# Patient Record
Sex: Female | Born: 1950 | Race: Black or African American | Hispanic: No | Smoking: Never smoker
Health system: Southern US, Community
[De-identification: ages and names within clinical notes are randomized; demographics above are authoritative.]

## PROBLEM LIST (undated history)

## (undated) DIAGNOSIS — E538 Deficiency of other specified B group vitamins: Secondary | ICD-10-CM

## (undated) DIAGNOSIS — Z9109 Other allergy status, other than to drugs and biological substances: Secondary | ICD-10-CM

## (undated) DIAGNOSIS — M1990 Unspecified osteoarthritis, unspecified site: Secondary | ICD-10-CM

## (undated) DIAGNOSIS — D649 Anemia, unspecified: Secondary | ICD-10-CM

## (undated) HISTORY — DX: Anemia, unspecified: D64.9

## (undated) HISTORY — DX: Deficiency of other specified B group vitamins: E53.8

## (undated) HISTORY — PX: TONSILLECTOMY AND ADENOIDECTOMY: SUR1326

---

## 1999-08-08 ENCOUNTER — Other Ambulatory Visit: Admission: RE | Admit: 1999-08-08 | Discharge: 1999-08-08 | Payer: Self-pay | Admitting: Gynecology

## 2001-12-14 ENCOUNTER — Ambulatory Visit (HOSPITAL_COMMUNITY): Admission: RE | Admit: 2001-12-14 | Discharge: 2001-12-14 | Payer: Self-pay | Admitting: Gastroenterology

## 2001-12-14 ENCOUNTER — Encounter (INDEPENDENT_AMBULATORY_CARE_PROVIDER_SITE_OTHER): Payer: Self-pay | Admitting: Specialist

## 2003-03-30 ENCOUNTER — Other Ambulatory Visit: Admission: RE | Admit: 2003-03-30 | Discharge: 2003-03-30 | Payer: Self-pay | Admitting: Gynecology

## 2003-07-18 ENCOUNTER — Encounter: Admission: RE | Admit: 2003-07-18 | Discharge: 2003-07-18 | Payer: Self-pay | Admitting: Oncology

## 2004-02-02 ENCOUNTER — Ambulatory Visit: Payer: Self-pay | Admitting: Oncology

## 2004-05-02 ENCOUNTER — Ambulatory Visit: Payer: Self-pay | Admitting: Oncology

## 2004-07-02 ENCOUNTER — Ambulatory Visit: Payer: Self-pay | Admitting: Oncology

## 2004-09-11 ENCOUNTER — Ambulatory Visit: Payer: Self-pay | Admitting: Oncology

## 2004-09-19 ENCOUNTER — Ambulatory Visit: Payer: Self-pay | Admitting: Internal Medicine

## 2004-09-20 ENCOUNTER — Ambulatory Visit: Payer: Self-pay | Admitting: Internal Medicine

## 2004-11-06 ENCOUNTER — Ambulatory Visit: Payer: Self-pay | Admitting: Oncology

## 2004-11-30 ENCOUNTER — Ambulatory Visit: Payer: Self-pay | Admitting: Internal Medicine

## 2005-01-08 ENCOUNTER — Ambulatory Visit: Payer: Self-pay | Admitting: Oncology

## 2005-03-13 ENCOUNTER — Ambulatory Visit: Payer: Self-pay | Admitting: Oncology

## 2005-05-14 ENCOUNTER — Ambulatory Visit: Payer: Self-pay | Admitting: Oncology

## 2005-05-29 ENCOUNTER — Ambulatory Visit: Payer: Self-pay | Admitting: Internal Medicine

## 2005-06-24 ENCOUNTER — Ambulatory Visit: Payer: Self-pay | Admitting: Gastroenterology

## 2005-07-09 ENCOUNTER — Ambulatory Visit: Payer: Self-pay | Admitting: Oncology

## 2005-09-09 ENCOUNTER — Ambulatory Visit: Payer: Self-pay | Admitting: Oncology

## 2005-09-11 LAB — CBC WITH DIFFERENTIAL/PLATELET
EOS%: 5.4 % (ref 0.0–7.0)
HCT: 38 % (ref 34.8–46.6)
MCV: 81.3 fL (ref 81.0–101.0)
MONO#: 0.6 10*3/uL (ref 0.1–0.9)
NEUT#: 3.8 10*3/uL (ref 1.5–6.5)
RBC: 4.67 10*6/uL (ref 3.70–5.32)
WBC: 5.7 10*3/uL (ref 3.9–10.0)
lymph#: 1 10*3/uL (ref 0.9–3.3)

## 2005-11-04 ENCOUNTER — Ambulatory Visit: Payer: Self-pay | Admitting: Oncology

## 2005-12-30 ENCOUNTER — Ambulatory Visit: Payer: Self-pay | Admitting: Oncology

## 2006-02-24 ENCOUNTER — Ambulatory Visit: Payer: Self-pay | Admitting: Oncology

## 2006-02-26 ENCOUNTER — Other Ambulatory Visit: Admission: RE | Admit: 2006-02-26 | Discharge: 2006-02-26 | Payer: Self-pay | Admitting: Gynecology

## 2006-04-21 ENCOUNTER — Ambulatory Visit: Payer: Self-pay | Admitting: Oncology

## 2006-06-16 ENCOUNTER — Ambulatory Visit: Payer: Self-pay | Admitting: Oncology

## 2006-08-11 ENCOUNTER — Ambulatory Visit: Payer: Self-pay | Admitting: Oncology

## 2006-09-04 LAB — MORPHOLOGY: PLT EST: ADEQUATE

## 2006-09-04 LAB — CBC WITH DIFFERENTIAL/PLATELET
Basophils Absolute: 0 10*3/uL (ref 0.0–0.1)
EOS%: 6 % (ref 0.0–7.0)
HGB: 11.6 g/dL (ref 11.6–15.9)
MCHC: 33.2 g/dL (ref 32.0–36.0)
MONO#: 0.6 10*3/uL (ref 0.1–0.9)
NEUT#: 3.5 10*3/uL (ref 1.5–6.5)
Platelets: 237 10*3/uL (ref 145–400)
RBC: 4.27 10*6/uL (ref 3.70–5.32)
RDW: 13.5 % (ref 11.3–14.5)
lymph#: 0.9 10*3/uL (ref 0.9–3.3)

## 2006-10-09 ENCOUNTER — Ambulatory Visit: Payer: Self-pay | Admitting: Oncology

## 2006-10-16 ENCOUNTER — Encounter: Admission: RE | Admit: 2006-10-16 | Discharge: 2006-10-16 | Payer: Self-pay | Admitting: Oncology

## 2006-11-28 ENCOUNTER — Encounter: Payer: Self-pay | Admitting: *Deleted

## 2006-12-01 ENCOUNTER — Ambulatory Visit: Payer: Self-pay | Admitting: Oncology

## 2007-01-26 ENCOUNTER — Ambulatory Visit: Payer: Self-pay | Admitting: Oncology

## 2007-03-23 ENCOUNTER — Ambulatory Visit: Payer: Self-pay | Admitting: Oncology

## 2007-05-18 ENCOUNTER — Ambulatory Visit: Payer: Self-pay | Admitting: Oncology

## 2007-07-13 ENCOUNTER — Ambulatory Visit: Payer: Self-pay | Admitting: Oncology

## 2007-09-07 ENCOUNTER — Ambulatory Visit: Payer: Self-pay | Admitting: Oncology

## 2007-09-09 LAB — CBC WITH DIFFERENTIAL/PLATELET
BASO%: 0.5 % (ref 0.0–2.0)
Basophils Absolute: 0 10*3/uL (ref 0.0–0.1)
EOS%: 5 % (ref 0.0–7.0)
Eosinophils Absolute: 0.3 10*3/uL (ref 0.0–0.5)
HGB: 12.7 g/dL (ref 11.6–15.9)
LYMPH%: 14.5 % (ref 14.0–48.0)
MCV: 80.7 fL — ABNORMAL LOW (ref 81.0–101.0)
MONO%: 9.2 % (ref 0.0–13.0)
NEUT%: 70.8 % (ref 39.6–76.8)
WBC: 5.6 10*3/uL (ref 3.9–10.0)

## 2007-09-09 LAB — MORPHOLOGY: PLT EST: ADEQUATE

## 2007-11-02 ENCOUNTER — Ambulatory Visit: Payer: Self-pay | Admitting: Oncology

## 2008-01-25 ENCOUNTER — Ambulatory Visit: Payer: Self-pay | Admitting: Oncology

## 2008-03-23 ENCOUNTER — Ambulatory Visit: Payer: Self-pay | Admitting: Oncology

## 2008-05-19 ENCOUNTER — Ambulatory Visit: Payer: Self-pay | Admitting: Oncology

## 2008-06-23 ENCOUNTER — Ambulatory Visit: Payer: Self-pay | Admitting: Oncology

## 2008-09-06 ENCOUNTER — Ambulatory Visit: Payer: Self-pay | Admitting: Oncology

## 2008-09-08 LAB — CBC WITH DIFFERENTIAL/PLATELET
BASO%: 0.4 % (ref 0.0–2.0)
Basophils Absolute: 0 10*3/uL (ref 0.0–0.1)
MCH: 27.6 pg (ref 25.1–34.0)
MCHC: 33.9 g/dL (ref 31.5–36.0)
MCV: 81.3 fL (ref 79.5–101.0)
MONO%: 8.6 % (ref 0.0–14.0)
NEUT#: 4.2 10*3/uL (ref 1.5–6.5)
RBC: 4.48 10*6/uL (ref 3.70–5.45)
RDW: 12.8 % (ref 11.2–14.5)
WBC: 6.1 10*3/uL (ref 3.9–10.3)
lymph#: 0.9 10*3/uL (ref 0.9–3.3)

## 2008-09-08 LAB — MORPHOLOGY: PLT EST: ADEQUATE

## 2008-09-09 ENCOUNTER — Encounter: Admission: RE | Admit: 2008-09-09 | Discharge: 2008-09-09 | Payer: Self-pay | Admitting: Oncology

## 2008-10-04 ENCOUNTER — Ambulatory Visit: Payer: Self-pay | Admitting: Oncology

## 2008-11-01 ENCOUNTER — Ambulatory Visit: Payer: Self-pay | Admitting: Oncology

## 2008-12-27 ENCOUNTER — Ambulatory Visit: Payer: Self-pay | Admitting: Oncology

## 2009-01-26 ENCOUNTER — Ambulatory Visit: Payer: Self-pay | Admitting: Oncology

## 2009-03-21 ENCOUNTER — Ambulatory Visit: Payer: Self-pay | Admitting: Oncology

## 2009-04-20 ENCOUNTER — Ambulatory Visit: Payer: Self-pay | Admitting: Oncology

## 2009-07-11 ENCOUNTER — Ambulatory Visit: Payer: Self-pay | Admitting: Oncology

## 2009-08-10 ENCOUNTER — Ambulatory Visit: Payer: Self-pay | Admitting: Oncology

## 2009-10-05 ENCOUNTER — Ambulatory Visit: Payer: Self-pay | Admitting: Oncology

## 2009-10-05 LAB — CBC WITH DIFFERENTIAL/PLATELET
Basophils Absolute: 0 10*3/uL (ref 0.0–0.1)
Eosinophils Absolute: 0.4 10*3/uL (ref 0.0–0.5)
HGB: 12.3 g/dL (ref 11.6–15.9)
MCH: 27.3 pg (ref 25.1–34.0)
MCHC: 33.4 g/dL (ref 31.5–36.0)
NEUT#: 4.3 10*3/uL (ref 1.5–6.5)
WBC: 6.9 10*3/uL (ref 3.9–10.3)

## 2009-10-05 LAB — MORPHOLOGY: PLT EST: ADEQUATE

## 2009-10-06 ENCOUNTER — Encounter: Admission: RE | Admit: 2009-10-06 | Discharge: 2009-10-06 | Payer: Self-pay | Admitting: Gynecology

## 2009-11-24 ENCOUNTER — Ambulatory Visit: Payer: Self-pay | Admitting: Oncology

## 2009-12-26 ENCOUNTER — Ambulatory Visit: Payer: Self-pay | Admitting: Oncology

## 2010-01-25 ENCOUNTER — Ambulatory Visit: Payer: Self-pay | Admitting: Oncology

## 2010-03-22 ENCOUNTER — Ambulatory Visit: Payer: Self-pay | Admitting: Oncology

## 2010-04-15 ENCOUNTER — Encounter: Payer: Self-pay | Admitting: Oncology

## 2010-06-14 ENCOUNTER — Encounter (HOSPITAL_BASED_OUTPATIENT_CLINIC_OR_DEPARTMENT_OTHER): Payer: BC Managed Care – PPO | Admitting: Oncology

## 2010-06-14 DIAGNOSIS — D510 Vitamin B12 deficiency anemia due to intrinsic factor deficiency: Secondary | ICD-10-CM

## 2010-07-12 ENCOUNTER — Encounter (HOSPITAL_BASED_OUTPATIENT_CLINIC_OR_DEPARTMENT_OTHER): Payer: BC Managed Care – PPO | Admitting: Oncology

## 2010-07-12 DIAGNOSIS — D510 Vitamin B12 deficiency anemia due to intrinsic factor deficiency: Secondary | ICD-10-CM

## 2010-08-09 ENCOUNTER — Encounter (HOSPITAL_BASED_OUTPATIENT_CLINIC_OR_DEPARTMENT_OTHER): Payer: BC Managed Care – PPO | Admitting: Oncology

## 2010-08-09 DIAGNOSIS — D510 Vitamin B12 deficiency anemia due to intrinsic factor deficiency: Secondary | ICD-10-CM

## 2010-08-10 NOTE — Op Note (Signed)
   NAMECANDA, Kristen Nielsen                          ACCOUNT NO.:  [id number]   MEDICAL RECORD NO.:  [id number]                   PATIENT TYPE:  AMB   LOCATION:  ENDO                                 FACILITY:  [facility]   PHYSICIAN:  Florencia Reasons, M.D.             DATE OF BIRTH:  06-11-1950   DATE OF PROCEDURE:  12/14/2001  DATE OF DISCHARGE:                                 OPERATIVE REPORT   PROCEDURE PERFORMED:  Colonoscopy with polypectomy and biopsy.   ENDOSCOPIST:  Florencia Reasons, M.D.   INDICATIONS FOR PROCEDURE:  The patient is a 60 year old female for colon  cancer screening.   FINDINGS:  Diminutive, left-sided colonic polyps.   DESCRIPTION OF PROCEDURE:  The nature, purpose and risks of the procedure  had been discussed with the patient, who provided written consent.  Sedation  totaled fentanyl 80 mcg and Versed 8 mg IV without arrhythmias or  desaturation.   The Olympus adjustable tension pediatric video colonoscope was advanced to  the cecum as identified by visualization of the appendiceal orifice and  pullback was then performed.  The quality of the prep was excellent and it  is felt that all areas were well seen.  Some external abdominal compression  and placing the patient in the supine position helped [place] get to the cecum.   In the left colon were approximately three diminutive sessile hyperplastic  appearing polyps or prominent folds which were biopsied.  There was also a  slightly verrucous 4 mm raised fold or lesion at about 20 cm removed by  snare technique and retrieved by suctioning through the scope.   No large polyps, cancer, colitis, vascular malformations or diverticulosis  were noted.  The patient tolerated the procedure well and there were no  apparent complications.  Retroflexion in the rectum as well as reinspection  of the rectosigmoid was unremarkable.    IMPRESSION:  Small, left-sided colon polyps.   PLAN:  Await pathology.                                        Florencia Reasons, M.D.    RVB/MEDQ  D:  12/14/2001  T:  12/15/2001  Job:  [id number]   cc:   Valentino Hue. Magrinat, M.D.  [address] [facility]  [place]  [place] [address]  Fax: [phone]

## 2010-10-03 ENCOUNTER — Encounter (HOSPITAL_BASED_OUTPATIENT_CLINIC_OR_DEPARTMENT_OTHER): Payer: BC Managed Care – PPO | Admitting: Oncology

## 2010-10-03 ENCOUNTER — Other Ambulatory Visit: Payer: Self-pay | Admitting: Oncology

## 2010-10-03 DIAGNOSIS — Z1231 Encounter for screening mammogram for malignant neoplasm of breast: Secondary | ICD-10-CM

## 2010-10-03 DIAGNOSIS — D510 Vitamin B12 deficiency anemia due to intrinsic factor deficiency: Secondary | ICD-10-CM

## 2010-10-03 LAB — CBC WITH DIFFERENTIAL/PLATELET
EOS%: 5.5 % (ref 0.0–7.0)
HCT: 35.2 % (ref 34.8–46.6)
HGB: 11.5 g/dL — ABNORMAL LOW (ref 11.6–15.9)
MCH: 27.1 pg (ref 25.1–34.0)
MCHC: 32.8 g/dL (ref 31.5–36.0)
MCV: 82.8 fL (ref 79.5–101.0)
NEUT#: 3.9 10*3/uL (ref 1.5–6.5)
NEUT%: 66.1 % (ref 38.4–76.8)
Platelets: 200 10*3/uL (ref 145–400)
RBC: 4.25 10*6/uL (ref 3.70–5.45)
RDW: 13.9 % (ref 11.2–14.5)
WBC: 5.9 10*3/uL (ref 3.9–10.3)

## 2010-10-03 LAB — TSH: TSH: 0.748 u[IU]/mL (ref 0.350–4.500)

## 2010-10-18 ENCOUNTER — Ambulatory Visit
Admission: RE | Admit: 2010-10-18 | Discharge: 2010-10-18 | Disposition: A | Discharge status: Home / Self Care | Payer: BC Managed Care – PPO | Source: Ambulatory Visit | Attending: Oncology | Admitting: Oncology

## 2010-10-18 DIAGNOSIS — Z1231 Encounter for screening mammogram for malignant neoplasm of breast: Secondary | ICD-10-CM

## 2010-10-30 ENCOUNTER — Other Ambulatory Visit: Payer: Self-pay | Admitting: Gynecology

## 2010-11-02 ENCOUNTER — Encounter (HOSPITAL_BASED_OUTPATIENT_CLINIC_OR_DEPARTMENT_OTHER): Payer: BC Managed Care – PPO | Admitting: Oncology

## 2010-11-02 DIAGNOSIS — D510 Vitamin B12 deficiency anemia due to intrinsic factor deficiency: Secondary | ICD-10-CM

## 2011-01-02 ENCOUNTER — Encounter (HOSPITAL_BASED_OUTPATIENT_CLINIC_OR_DEPARTMENT_OTHER): Payer: BC Managed Care – PPO | Admitting: Oncology

## 2011-01-02 DIAGNOSIS — D510 Vitamin B12 deficiency anemia due to intrinsic factor deficiency: Secondary | ICD-10-CM

## 2011-01-28 ENCOUNTER — Other Ambulatory Visit: Payer: Self-pay | Admitting: Oncology

## 2011-01-28 DIAGNOSIS — D510 Vitamin B12 deficiency anemia due to intrinsic factor deficiency: Secondary | ICD-10-CM | POA: Insufficient documentation

## 2011-01-28 MED ORDER — CYANOCOBALAMIN 1000 MCG/ML IJ SOLN: 1000.0000 ug | INTRAMUSCULAR | Status: DC

## 2011-01-29 ENCOUNTER — Other Ambulatory Visit: Payer: Self-pay | Admitting: Oncology

## 2011-01-29 ENCOUNTER — Ambulatory Visit (HOSPITAL_BASED_OUTPATIENT_CLINIC_OR_DEPARTMENT_OTHER): Payer: BC Managed Care – PPO

## 2011-01-29 VITALS — BP 119/74 | HR 56 | Temp 97.1°F

## 2011-01-29 DIAGNOSIS — D510 Vitamin B12 deficiency anemia due to intrinsic factor deficiency: Secondary | ICD-10-CM

## 2011-01-29 MED ORDER — CYANOCOBALAMIN 1000 MCG/ML IJ SOLN
1000.0000 ug | Freq: Once | INTRAMUSCULAR | Status: AC
  Administered 2011-01-29: 1000 ug via INTRAMUSCULAR

## 2011-02-21 ENCOUNTER — Ambulatory Visit (HOSPITAL_BASED_OUTPATIENT_CLINIC_OR_DEPARTMENT_OTHER): Payer: BC Managed Care – PPO

## 2011-02-21 VITALS — BP 113/69 | HR 60 | Temp 96.9°F

## 2011-02-21 DIAGNOSIS — D510 Vitamin B12 deficiency anemia due to intrinsic factor deficiency: Secondary | ICD-10-CM

## 2011-02-21 MED ORDER — CYANOCOBALAMIN 1000 MCG/ML IJ SOLN
1000.0000 ug | Freq: Once | INTRAMUSCULAR | Status: AC
  Administered 2011-02-21: 1000 ug via INTRAMUSCULAR

## 2011-03-21 ENCOUNTER — Ambulatory Visit (HOSPITAL_BASED_OUTPATIENT_CLINIC_OR_DEPARTMENT_OTHER): Payer: BC Managed Care – PPO

## 2011-03-21 VITALS — BP 112/69 | HR 68 | Temp 97.2°F

## 2011-03-21 DIAGNOSIS — D510 Vitamin B12 deficiency anemia due to intrinsic factor deficiency: Secondary | ICD-10-CM

## 2011-03-21 MED ORDER — CYANOCOBALAMIN 1000 MCG/ML IJ SOLN
1000.0000 ug | Freq: Once | INTRAMUSCULAR | Status: AC
  Administered 2011-03-21: 1000 ug via INTRAMUSCULAR

## 2011-03-23 ENCOUNTER — Telehealth: Payer: Self-pay | Admitting: Oncology

## 2011-03-23 NOTE — Telephone Encounter (Signed)
Talked to pt gave her appt for 1/24 lab and injection, pt will come and get calendar for appts until July 2013, lab and MD visit with inj

## 2011-04-18 ENCOUNTER — Ambulatory Visit: Payer: BC Managed Care – PPO

## 2011-05-16 ENCOUNTER — Ambulatory Visit (HOSPITAL_BASED_OUTPATIENT_CLINIC_OR_DEPARTMENT_OTHER): Payer: BC Managed Care – PPO

## 2011-05-16 VITALS — BP 107/66 | HR 85 | Temp 97.7°F

## 2011-05-16 DIAGNOSIS — D510 Vitamin B12 deficiency anemia due to intrinsic factor deficiency: Secondary | ICD-10-CM

## 2011-05-16 MED ORDER — CYANOCOBALAMIN 1000 MCG/ML IJ SOLN
1000.0000 ug | Freq: Once | INTRAMUSCULAR | Status: AC
  Administered 2011-05-16: 1000 ug via INTRAMUSCULAR

## 2011-06-13 ENCOUNTER — Ambulatory Visit (HOSPITAL_BASED_OUTPATIENT_CLINIC_OR_DEPARTMENT_OTHER): Payer: BC Managed Care – PPO

## 2011-06-13 VITALS — BP 128/78 | HR 56 | Temp 97.2°F

## 2011-06-13 DIAGNOSIS — D510 Vitamin B12 deficiency anemia due to intrinsic factor deficiency: Secondary | ICD-10-CM

## 2011-06-13 MED ORDER — CYANOCOBALAMIN 1000 MCG/ML IJ SOLN
1000.0000 ug | Freq: Once | INTRAMUSCULAR | Status: AC
  Administered 2011-06-13: 1000 ug via INTRAMUSCULAR

## 2011-06-13 NOTE — Patient Instructions (Signed)
Pt discharged home ambulatory.  Instructed to call with questions/concerns. shk

## 2011-07-11 ENCOUNTER — Ambulatory Visit: Payer: BC Managed Care – PPO

## 2011-08-08 ENCOUNTER — Ambulatory Visit (HOSPITAL_BASED_OUTPATIENT_CLINIC_OR_DEPARTMENT_OTHER): Payer: BC Managed Care – PPO

## 2011-08-08 VITALS — BP 124/73 | HR 59 | Temp 97.1°F

## 2011-08-08 DIAGNOSIS — D510 Vitamin B12 deficiency anemia due to intrinsic factor deficiency: Secondary | ICD-10-CM

## 2011-08-08 MED ORDER — CYANOCOBALAMIN 1000 MCG/ML IJ SOLN
1000.0000 ug | Freq: Once | INTRAMUSCULAR | Status: AC
  Administered 2011-08-08: 1000 ug via INTRAMUSCULAR

## 2011-09-05 ENCOUNTER — Ambulatory Visit (HOSPITAL_BASED_OUTPATIENT_CLINIC_OR_DEPARTMENT_OTHER): Payer: BC Managed Care – PPO

## 2011-09-05 VITALS — BP 114/71 | HR 58 | Temp 96.8°F

## 2011-09-05 DIAGNOSIS — D510 Vitamin B12 deficiency anemia due to intrinsic factor deficiency: Secondary | ICD-10-CM

## 2011-09-05 MED ORDER — CYANOCOBALAMIN 1000 MCG/ML IJ SOLN
1000.0000 ug | Freq: Once | INTRAMUSCULAR | Status: AC
  Administered 2011-09-05: 1000 ug via INTRAMUSCULAR

## 2011-10-02 ENCOUNTER — Ambulatory Visit (HOSPITAL_BASED_OUTPATIENT_CLINIC_OR_DEPARTMENT_OTHER): Payer: BC Managed Care – PPO | Admitting: Oncology

## 2011-10-02 ENCOUNTER — Other Ambulatory Visit (HOSPITAL_BASED_OUTPATIENT_CLINIC_OR_DEPARTMENT_OTHER): Payer: BC Managed Care – PPO | Admitting: Lab

## 2011-10-02 ENCOUNTER — Telehealth: Payer: Self-pay | Admitting: Oncology

## 2011-10-02 ENCOUNTER — Encounter: Payer: Self-pay | Admitting: Oncology

## 2011-10-02 VITALS — BP 124/72 | HR 67 | Temp 98.2°F | Ht 65.5 in | Wt 190.9 lb

## 2011-10-02 DIAGNOSIS — D510 Vitamin B12 deficiency anemia due to intrinsic factor deficiency: Secondary | ICD-10-CM

## 2011-10-02 LAB — CBC WITH DIFFERENTIAL/PLATELET
BASO%: 0.5 % (ref 0.0–2.0)
EOS%: 6.1 % (ref 0.0–7.0)
MCH: 26.5 pg (ref 25.1–34.0)
MCHC: 31.8 g/dL (ref 31.5–36.0)
NEUT%: 61.8 % (ref 38.4–76.8)
RBC: 4.41 10*6/uL (ref 3.70–5.45)
RDW: 13.5 % (ref 11.2–14.5)
lymph#: 1.2 10*3/uL (ref 0.9–3.3)

## 2011-10-02 MED ORDER — CYANOCOBALAMIN 1000 MCG/ML IJ SOLN
1000.0000 ug | Freq: Once | INTRAMUSCULAR | Status: AC
  Administered 2011-10-02: 1000 ug via INTRAMUSCULAR

## 2011-10-02 NOTE — Telephone Encounter (Signed)
gve the pt her aug-July 2014 appt calendars

## 2011-10-02 NOTE — Progress Notes (Signed)
ID: Kristen Nielsen   DOB: 1951-03-13  MR#: [id number]  CSN#:[id number]  HISTORY OF PRESENT ILLNESS: The patient was diagnosed with pernicious anemia in 1996. She receives her B12 shots here since she does not have a primary care physician.  INTERVAL HISTORY: The interval history is unremarkable.  REVIEW OF SYSTEMS: She has mild occasional sinus problems, occasional shortness of breath when walking up stairs, not accompanied by chest pain or pressure. Otherwise she is exercising at least twice a week doing line dances and is of physically active. A detailed review of systems was noncontributory  PAST MEDICAL HISTORY: No past medical history on file.  PAST SURGICAL HISTORY: No past surgical history on file.  FAMILY HISTORY As of July of 2013, her parents are still alive. Her father has a history of prostate cancer. The patient has one sister, who was diagnosed with breast cancer the age of 81. She is now doing well. She has one brother.  GYNECOLOGIC HISTORY: GX P2, first live birth age 32. Change of life age 35. She never took hormone replacement.  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 Her daughter lives in [place], her son here in town. She has 4 grandchildren and attends a local [address].   ADVANCED DIRECTIVES:  HEALTH MAINTENANCE: History  Substance Use Topics  . Smoking status: Not on file  . Smokeless tobacco: Not on file  . Alcohol Use: Not on file     Colonoscopy: Buccini  PAP: Lomax  Bone density: 2007/ normal  Lipid panel: Lomax  No Known Allergies  Current Outpatient Prescriptions  Medication Sig Dispense Refill  . acetaminophen (TYLENOL) 325 MG tablet Take 650 mg by mouth as needed.      Marland Kitchen aspirin 325 MG tablet Take 325 mg by mouth daily.      . cyanocobalamin 1000 MCG tablet Inject 100 mcg into the skin every 30 (thirty) days.         OBJECTIVE: Middle-aged [place] American woman who appears  well Filed Vitals:   10/02/11 1115  BP: 124/72  Pulse: 67  Temp: 98.2 F (36.8 C)     Body mass index is 31.28 kg/(m^2).    ECOG FS: 0  Sclerae unicteric Oropharynx clear No cervical or supraclavicular adenopathy Lungs no rales or rhonchi Heart regular rate and rhythm Abd benign MSK no focal spinal tenderness, no peripheral edema Neuro: nonfocal Breasts: Neither breast shows any suspicious lumps, skin change, or nipple retraction. Both axillae are unremarkable  LAB RESULTS: Lab Results  Component Value Date   WBC 6.2 10/02/2011   NEUTROABS 3.9 10/02/2011   HGB 11.7 10/02/2011   HCT 36.7 10/02/2011   MCV 83.2 10/02/2011   PLT 222 10/02/2011      Chemistry   No results found for this basename: NA, K, CL, CO2, BUN, CREATININE, GLU   No results found for this basename: CALCIUM, ALKPHOS, AST, ALT, BILITOT       No results found for this basename: LABCA2    No components found with this basename: LABCA125    No results found for this basename: INR:1;PROTIME:1 in the last 168 hours  Urinalysis No results found for this basename: colorurine, appearanceur, labspec, phurine, glucoseu, hgbur, bilirubinur, ketonesur, proteinur, urobilinogen, nitrite, leukocytesur    STUDIES: No results found.  ASSESSMENT: 61 y.o. [place] woman with history of pernicious anemia diagnosed in 1996 with a positive intrinsic factor for blocking antibody.  Currently receives B12 injections on  a monthly basis, due again today.   PLAN: She will receive her shot today and every 4 weeks for the next year. She will continue to see [place] on a once a year basis. She knows to call for any problems that may develop before the next visit.   Saim Almanza C    10/02/2011

## 2011-10-30 ENCOUNTER — Ambulatory Visit (HOSPITAL_BASED_OUTPATIENT_CLINIC_OR_DEPARTMENT_OTHER): Payer: BC Managed Care – PPO

## 2011-10-30 VITALS — BP 152/81 | HR 56 | Temp 97.7°F

## 2011-10-30 DIAGNOSIS — D510 Vitamin B12 deficiency anemia due to intrinsic factor deficiency: Secondary | ICD-10-CM

## 2011-10-30 MED ORDER — CYANOCOBALAMIN 1000 MCG/ML IJ SOLN
1000.0000 ug | Freq: Once | INTRAMUSCULAR | Status: AC
  Administered 2011-10-30: 1000 ug via INTRAMUSCULAR

## 2011-10-30 NOTE — Patient Instructions (Signed)
Call MD with any problems 

## 2011-11-27 ENCOUNTER — Ambulatory Visit (HOSPITAL_BASED_OUTPATIENT_CLINIC_OR_DEPARTMENT_OTHER): Payer: BC Managed Care – PPO

## 2011-11-27 VITALS — BP 125/77 | HR 59 | Temp 97.2°F

## 2011-11-27 DIAGNOSIS — D510 Vitamin B12 deficiency anemia due to intrinsic factor deficiency: Secondary | ICD-10-CM

## 2011-11-27 MED ORDER — CYANOCOBALAMIN 1000 MCG/ML IJ SOLN
1000.0000 ug | Freq: Once | INTRAMUSCULAR | Status: AC
  Administered 2011-11-27: 1000 ug via INTRAMUSCULAR

## 2011-12-03 ENCOUNTER — Other Ambulatory Visit: Payer: Self-pay | Admitting: Gynecology

## 2011-12-03 DIAGNOSIS — Z1231 Encounter for screening mammogram for malignant neoplasm of breast: Secondary | ICD-10-CM

## 2011-12-12 ENCOUNTER — Other Ambulatory Visit: Payer: Self-pay | Admitting: Gynecology

## 2011-12-20 ENCOUNTER — Ambulatory Visit
Admission: RE | Admit: 2011-12-20 | Discharge: 2011-12-20 | Disposition: A | Discharge status: Home / Self Care | Payer: BC Managed Care – PPO | Source: Ambulatory Visit | Attending: Gynecology | Admitting: Gynecology

## 2011-12-20 DIAGNOSIS — Z1231 Encounter for screening mammogram for malignant neoplasm of breast: Secondary | ICD-10-CM

## 2011-12-25 ENCOUNTER — Ambulatory Visit (HOSPITAL_BASED_OUTPATIENT_CLINIC_OR_DEPARTMENT_OTHER): Payer: BC Managed Care – PPO

## 2011-12-25 VITALS — BP 107/71 | HR 55 | Temp 97.6°F

## 2011-12-25 DIAGNOSIS — D510 Vitamin B12 deficiency anemia due to intrinsic factor deficiency: Secondary | ICD-10-CM

## 2011-12-25 MED ORDER — CYANOCOBALAMIN 1000 MCG/ML IJ SOLN
1000.0000 ug | Freq: Once | INTRAMUSCULAR | Status: AC
  Administered 2011-12-25: 1000 ug via INTRAMUSCULAR

## 2012-01-29 ENCOUNTER — Telehealth: Payer: Self-pay | Admitting: *Deleted

## 2012-01-29 ENCOUNTER — Ambulatory Visit: Payer: BC Managed Care – PPO

## 2012-01-29 NOTE — Telephone Encounter (Signed)
Called patient at home about missed appointment  Rescheduled appointment to 01/30/12.

## 2012-01-30 ENCOUNTER — Ambulatory Visit (HOSPITAL_BASED_OUTPATIENT_CLINIC_OR_DEPARTMENT_OTHER): Payer: BC Managed Care – PPO

## 2012-01-30 VITALS — BP 107/64 | HR 63 | Temp 98.1°F

## 2012-01-30 DIAGNOSIS — D510 Vitamin B12 deficiency anemia due to intrinsic factor deficiency: Secondary | ICD-10-CM

## 2012-01-30 MED ORDER — CYANOCOBALAMIN 1000 MCG/ML IJ SOLN
1000.0000 ug | Freq: Once | INTRAMUSCULAR | Status: AC
  Administered 2012-01-30: 1000 ug via INTRAMUSCULAR

## 2012-02-26 ENCOUNTER — Ambulatory Visit (HOSPITAL_BASED_OUTPATIENT_CLINIC_OR_DEPARTMENT_OTHER): Payer: BC Managed Care – PPO

## 2012-02-26 VITALS — BP 121/72 | HR 60 | Temp 96.9°F

## 2012-02-26 DIAGNOSIS — D510 Vitamin B12 deficiency anemia due to intrinsic factor deficiency: Secondary | ICD-10-CM

## 2012-02-26 MED ORDER — CYANOCOBALAMIN 1000 MCG/ML IJ SOLN
1000.0000 ug | Freq: Once | INTRAMUSCULAR | Status: AC
  Administered 2012-02-26: 1000 ug via INTRAMUSCULAR

## 2012-03-12 ENCOUNTER — Telehealth: Payer: Self-pay | Admitting: Oncology

## 2012-03-12 NOTE — Telephone Encounter (Signed)
Office closed on 1/1. Moved inj appt to 1/2 @ 8:30 am. lmonvm for pt re change w/new d/t. Also called mobile and was able to s/w pt re change. Pt has new appt d/t.

## 2012-03-25 ENCOUNTER — Ambulatory Visit: Payer: BC Managed Care – PPO

## 2012-03-26 ENCOUNTER — Ambulatory Visit (HOSPITAL_BASED_OUTPATIENT_CLINIC_OR_DEPARTMENT_OTHER): Payer: BC Managed Care – PPO

## 2012-03-26 VITALS — BP 119/74 | HR 59 | Temp 96.5°F

## 2012-03-26 DIAGNOSIS — D510 Vitamin B12 deficiency anemia due to intrinsic factor deficiency: Secondary | ICD-10-CM

## 2012-03-26 MED ORDER — CYANOCOBALAMIN 1000 MCG/ML IJ SOLN
1000.0000 ug | Freq: Once | INTRAMUSCULAR | Status: AC
  Administered 2012-03-26: 1000 ug via INTRAMUSCULAR

## 2012-04-29 ENCOUNTER — Ambulatory Visit (HOSPITAL_BASED_OUTPATIENT_CLINIC_OR_DEPARTMENT_OTHER): Payer: BC Managed Care – PPO

## 2012-04-29 VITALS — BP 128/86 | HR 67 | Temp 97.1°F

## 2012-04-29 DIAGNOSIS — D510 Vitamin B12 deficiency anemia due to intrinsic factor deficiency: Secondary | ICD-10-CM

## 2012-04-29 MED ORDER — CYANOCOBALAMIN 1000 MCG/ML IJ SOLN
1000.0000 ug | Freq: Once | INTRAMUSCULAR | Status: AC
  Administered 2012-04-29: 1000 ug via INTRAMUSCULAR

## 2012-05-27 ENCOUNTER — Ambulatory Visit (HOSPITAL_BASED_OUTPATIENT_CLINIC_OR_DEPARTMENT_OTHER): Payer: BC Managed Care – PPO

## 2012-05-27 VITALS — BP 127/73 | HR 55 | Temp 97.6°F

## 2012-05-27 DIAGNOSIS — D510 Vitamin B12 deficiency anemia due to intrinsic factor deficiency: Secondary | ICD-10-CM

## 2012-05-27 MED ORDER — CYANOCOBALAMIN 1000 MCG/ML IJ SOLN
1000.0000 ug | Freq: Once | INTRAMUSCULAR | Status: AC
  Administered 2012-05-27: 1000 ug via INTRAMUSCULAR

## 2012-06-24 ENCOUNTER — Ambulatory Visit (HOSPITAL_BASED_OUTPATIENT_CLINIC_OR_DEPARTMENT_OTHER): Payer: BC Managed Care – PPO

## 2012-06-24 VITALS — BP 132/64 | HR 64 | Temp 97.4°F

## 2012-06-24 DIAGNOSIS — D510 Vitamin B12 deficiency anemia due to intrinsic factor deficiency: Secondary | ICD-10-CM

## 2012-06-24 MED ORDER — CYANOCOBALAMIN 1000 MCG/ML IJ SOLN
1000.0000 ug | Freq: Once | INTRAMUSCULAR | Status: AC
  Administered 2012-06-24: 1000 ug via INTRAMUSCULAR

## 2012-07-29 ENCOUNTER — Ambulatory Visit (HOSPITAL_BASED_OUTPATIENT_CLINIC_OR_DEPARTMENT_OTHER): Payer: BC Managed Care – PPO

## 2012-07-29 VITALS — BP 127/69 | HR 57 | Temp 97.7°F

## 2012-07-29 DIAGNOSIS — D510 Vitamin B12 deficiency anemia due to intrinsic factor deficiency: Secondary | ICD-10-CM

## 2012-07-29 MED ORDER — CYANOCOBALAMIN 1000 MCG/ML IJ SOLN
1000.0000 ug | Freq: Once | INTRAMUSCULAR | Status: AC
  Administered 2012-07-29: 1000 ug via INTRAMUSCULAR

## 2012-08-26 ENCOUNTER — Ambulatory Visit (HOSPITAL_BASED_OUTPATIENT_CLINIC_OR_DEPARTMENT_OTHER): Payer: BC Managed Care – PPO

## 2012-08-26 VITALS — BP 129/82 | HR 50 | Temp 96.7°F | Resp 18

## 2012-08-26 DIAGNOSIS — D510 Vitamin B12 deficiency anemia due to intrinsic factor deficiency: Secondary | ICD-10-CM

## 2012-08-26 MED ORDER — CYANOCOBALAMIN 1000 MCG/ML IJ SOLN
1000.0000 ug | Freq: Once | INTRAMUSCULAR | Status: AC
  Administered 2012-08-26: 1000 ug via INTRAMUSCULAR

## 2012-08-26 NOTE — Patient Instructions (Signed)
Call MD for problems or concerns 

## 2012-09-23 ENCOUNTER — Ambulatory Visit: Payer: BC Managed Care – PPO

## 2012-09-29 ENCOUNTER — Other Ambulatory Visit (HOSPITAL_BASED_OUTPATIENT_CLINIC_OR_DEPARTMENT_OTHER): Payer: BC Managed Care – PPO | Admitting: Lab

## 2012-09-29 ENCOUNTER — Telehealth: Payer: Self-pay | Admitting: *Deleted

## 2012-09-29 ENCOUNTER — Ambulatory Visit (HOSPITAL_BASED_OUTPATIENT_CLINIC_OR_DEPARTMENT_OTHER): Payer: BC Managed Care – PPO | Admitting: Oncology

## 2012-09-29 VITALS — BP 120/68 | HR 55 | Temp 97.6°F | Resp 20 | Ht 65.5 in | Wt 184.4 lb

## 2012-09-29 DIAGNOSIS — D510 Vitamin B12 deficiency anemia due to intrinsic factor deficiency: Secondary | ICD-10-CM

## 2012-09-29 LAB — CBC WITH DIFFERENTIAL/PLATELET
BASO%: 0.8 % (ref 0.0–2.0)
EOS%: 6.8 % (ref 0.0–7.0)
Eosinophils Absolute: 0.4 10*3/uL (ref 0.0–0.5)
LYMPH%: 19.6 % (ref 14.0–49.7)
MCHC: 33.1 g/dL (ref 31.5–36.0)
MCV: 82 fL (ref 79.5–101.0)
MONO%: 10 % (ref 0.0–14.0)
NEUT#: 3.7 10*3/uL (ref 1.5–6.5)
RBC: 4.48 10*6/uL (ref 3.70–5.45)
RDW: 13.3 % (ref 11.2–14.5)

## 2012-09-29 MED ORDER — CYANOCOBALAMIN 1000 MCG/ML IJ SOLN
1000.0000 ug | Freq: Once | INTRAMUSCULAR | Status: AC
  Administered 2012-09-29: 1000 ug via INTRAMUSCULAR

## 2012-09-29 NOTE — Telephone Encounter (Signed)
appts made and printed...td 

## 2012-09-29 NOTE — Progress Notes (Signed)
ID: Kristen Nielsen   DOB: 1951/03/07  MR#: [id number]  CSN#:[id number]  PCP: Sonda Primes, MD GYN: Beather Arbour SU:  OTHER MD:   HISTORY OF PRESENT ILLNESS: The patient was diagnosed with pernicious anemia in 1996. She receives her B12 shots here since she does not have a primary care physician.  INTERVAL HISTORY: Kristen Nielsen is doing fine, taking care of her parents who are now in their early 101s and declining. She's also helping out her siblings, she tells me. She exercises chiefly by doing line dancing.  REVIEW OF SYSTEMS: She just got new glasses, and they seem to be working well. She has chronic sinus issues, which she treats with Zyrtec. She has pain involving particularly the right hand, which is not constant not increasing in intensity or frequency. Otherwise a detailed review of systems today was noncontributory  PAST MEDICAL HISTORY: No past medical history on file.  PAST SURGICAL HISTORY: Past Surgical History  Procedure Laterality Date  . Tonsillectomy and adenoidectomy      FAMILY HISTORY As of July of 2014, her parents are still alive. Her father has a history of prostate cancer. Both parents have been in the hospital recently and are now receiving home physical therapy. The patient has one sister, who was diagnosed with breast cancer the age of 50. She is now doing well. She has one brother.  GYNECOLOGIC HISTORY: GX P2, first live birth age 62. Change of life age 62. She never took hormone replacement.  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 Her daughter lives in [place], her son here in town. She has 4 grandchildren and attends a local [address].   ADVANCED DIRECTIVES: Not in place  HEALTH MAINTENANCE: History  Substance Use Topics  . Smoking status: Not on file  . Smokeless tobacco: Not on file  . Alcohol Use: Not on file     Colonoscopy: Buccini  PAP: Lomax  Bone density: 2007/ normal  Lipid  panel: Lomax  No Known Allergies  Current Outpatient Prescriptions  Medication Sig Dispense Refill  . acetaminophen (TYLENOL) 325 MG tablet Take 650 mg by mouth as needed.      Marland Kitchen aspirin 325 MG tablet Take 325 mg by mouth daily.      . cyanocobalamin 1000 MCG tablet Inject 100 mcg into the skin every 30 (thirty) days.        No current facility-administered medications for this visit.    OBJECTIVE: Middle-aged [place] American woman in no acute distress Filed Vitals:   09/29/12 0958  BP: 120/68  Pulse: 55  Temp: 97.6 F (36.4 C)  Resp: 20     Body mass index is 30.21 kg/(m^2).    ECOG FS: 0  Sclerae unicteric Oropharynx clear No cervical or supraclavicular adenopathy Lungs no rales or rhonchi Heart regular rate and rhythm Abd soft, nontender, positive bowel sounds MSK no focal spinal tenderness, no peripheral edema Neuro: nonfocal, well oriented, pleasant affect Breasts: Both breasts are benign. Both axillae are unremarkable  LAB RESULTS: Lab Results  Component Value Date   WBC 6.0 09/29/2012   NEUTROABS 3.7 09/29/2012   HGB 12.2 09/29/2012   HCT 36.8 09/29/2012   MCV 82.0 09/29/2012   PLT 226 09/29/2012      Chemistry   No results found for this basename: NA,  K,  CL,  CO2,  BUN,  CREATININE,  GLU   No results found for this basename: CALCIUM,  ALKPHOS,  AST,  ALT,  BILITOT       No results found for this basename: LABCA2    No components found with this basename: LABCA125    No results found for this basename: INR,  in the last 168 hours  Urinalysis No results found for this basename: colorurine,  appearanceur,  labspec,  phurine,  glucoseu,  hgbur,  bilirubinur,  ketonesur,  proteinur,  urobilinogen,  nitrite,  leukocytesur    STUDIES: No results found.   ASSESSMENT: 62 y.o. [place] woman with history of pernicious anemia diagnosed in 1996 with a positive intrinsic factor for blocking antibody.  Receives B12 injections on a monthly basis, due again today.    PLAN: Kristen Nielsen is generally doing very well. She has lost her gynecologist and is not sure who she is going to followup with. She has not seen her primary care physician in some time, and I encouraged her to reestablish yourself with him. Otherwise we are continuing as before. She will see [place] again in one year. She knows to call for any problems that may develop before her next visit here.  MAGRINAT,GUSTAV C    09/29/2012

## 2012-09-29 NOTE — Addendum Note (Signed)
Addended by: Billey Co on: 09/29/2012 06:45 PM   Modules accepted: Orders

## 2012-10-06 ENCOUNTER — Ambulatory Visit: Payer: BC Managed Care – PPO | Admitting: Oncology

## 2012-10-27 ENCOUNTER — Other Ambulatory Visit: Payer: Self-pay | Admitting: *Deleted

## 2012-10-28 ENCOUNTER — Other Ambulatory Visit: Payer: BC Managed Care – PPO | Admitting: Lab

## 2012-10-28 ENCOUNTER — Ambulatory Visit (HOSPITAL_BASED_OUTPATIENT_CLINIC_OR_DEPARTMENT_OTHER): Payer: BC Managed Care – PPO

## 2012-10-28 VITALS — BP 135/69 | HR 54 | Temp 97.7°F

## 2012-10-28 DIAGNOSIS — D510 Vitamin B12 deficiency anemia due to intrinsic factor deficiency: Secondary | ICD-10-CM

## 2012-10-28 MED ORDER — CYANOCOBALAMIN 1000 MCG/ML IJ SOLN
1000.0000 ug | Freq: Once | INTRAMUSCULAR | Status: AC
  Administered 2012-10-28: 1000 ug via INTRAMUSCULAR

## 2012-11-25 ENCOUNTER — Ambulatory Visit (HOSPITAL_BASED_OUTPATIENT_CLINIC_OR_DEPARTMENT_OTHER): Payer: BC Managed Care – PPO

## 2012-11-25 VITALS — BP 120/71 | HR 58 | Temp 97.5°F

## 2012-11-25 DIAGNOSIS — D510 Vitamin B12 deficiency anemia due to intrinsic factor deficiency: Secondary | ICD-10-CM

## 2012-11-25 MED ORDER — CYANOCOBALAMIN 1000 MCG/ML IJ SOLN
1000.0000 ug | Freq: Once | INTRAMUSCULAR | Status: AC
  Administered 2012-11-25: 1000 ug via INTRAMUSCULAR

## 2012-12-22 ENCOUNTER — Ambulatory Visit: Payer: BC Managed Care – PPO

## 2012-12-23 ENCOUNTER — Telehealth: Payer: Self-pay | Admitting: *Deleted

## 2012-12-23 ENCOUNTER — Ambulatory Visit: Payer: BC Managed Care – PPO

## 2012-12-23 NOTE — Telephone Encounter (Signed)
Called patient about missed appointment.  She is out of town and forgot about this appointment.  Will skip this one and come is for next scheduled appointment.

## 2013-01-20 ENCOUNTER — Encounter (INDEPENDENT_AMBULATORY_CARE_PROVIDER_SITE_OTHER): Payer: Self-pay

## 2013-01-20 ENCOUNTER — Ambulatory Visit (HOSPITAL_BASED_OUTPATIENT_CLINIC_OR_DEPARTMENT_OTHER): Payer: BC Managed Care – PPO

## 2013-01-20 VITALS — BP 138/71 | HR 67 | Temp 97.5°F

## 2013-01-20 DIAGNOSIS — D510 Vitamin B12 deficiency anemia due to intrinsic factor deficiency: Secondary | ICD-10-CM

## 2013-01-20 MED ORDER — CYANOCOBALAMIN 1000 MCG/ML IJ SOLN
1000.0000 ug | Freq: Once | INTRAMUSCULAR | Status: AC
  Administered 2013-01-20: 1000 ug via INTRAMUSCULAR

## 2013-02-17 ENCOUNTER — Ambulatory Visit (HOSPITAL_BASED_OUTPATIENT_CLINIC_OR_DEPARTMENT_OTHER): Payer: BC Managed Care – PPO

## 2013-02-17 ENCOUNTER — Encounter (INDEPENDENT_AMBULATORY_CARE_PROVIDER_SITE_OTHER): Payer: Self-pay

## 2013-02-17 VITALS — BP 123/61 | HR 66 | Temp 97.6°F

## 2013-02-17 DIAGNOSIS — D510 Vitamin B12 deficiency anemia due to intrinsic factor deficiency: Secondary | ICD-10-CM

## 2013-02-17 MED ORDER — CYANOCOBALAMIN 1000 MCG/ML IJ SOLN
1000.0000 ug | Freq: Once | INTRAMUSCULAR | Status: AC
  Administered 2013-02-17: 1000 ug via INTRAMUSCULAR

## 2013-03-17 ENCOUNTER — Ambulatory Visit (HOSPITAL_BASED_OUTPATIENT_CLINIC_OR_DEPARTMENT_OTHER): Payer: BC Managed Care – PPO

## 2013-03-17 VITALS — BP 114/65 | HR 64 | Temp 98.3°F

## 2013-03-17 DIAGNOSIS — D510 Vitamin B12 deficiency anemia due to intrinsic factor deficiency: Secondary | ICD-10-CM

## 2013-03-17 MED ORDER — CYANOCOBALAMIN 1000 MCG/ML IJ SOLN
1000.0000 ug | Freq: Once | INTRAMUSCULAR | Status: AC
  Administered 2013-03-17: 1000 ug via INTRAMUSCULAR

## 2013-04-14 ENCOUNTER — Encounter (INDEPENDENT_AMBULATORY_CARE_PROVIDER_SITE_OTHER): Payer: Self-pay

## 2013-04-14 ENCOUNTER — Telehealth: Payer: Self-pay | Admitting: Internal Medicine

## 2013-04-14 ENCOUNTER — Ambulatory Visit (HOSPITAL_BASED_OUTPATIENT_CLINIC_OR_DEPARTMENT_OTHER): Payer: BC Managed Care – PPO

## 2013-04-14 VITALS — BP 134/56 | HR 67 | Temp 98.1°F

## 2013-04-14 DIAGNOSIS — D510 Vitamin B12 deficiency anemia due to intrinsic factor deficiency: Secondary | ICD-10-CM

## 2013-04-14 MED ORDER — CYANOCOBALAMIN 1000 MCG/ML IJ SOLN
1000.0000 ug | Freq: Once | INTRAMUSCULAR | Status: AC
  Administered 2013-04-14: 1000 ug via INTRAMUSCULAR

## 2013-04-14 NOTE — Patient Instructions (Signed)
Vitamin B12 Injections Every person needs vitamin B12. A deficiency develops when the body does not get enough of it. One way to overcome this is by getting B12 shots (injections). A B12 shot puts the vitamin directly into muscle tissue. This avoids any problems your body might have in absorbing it from food or a pill. In some people, the body has trouble using the vitamin correctly. This can cause a B12 deficiency. Not consuming enough of the vitamin can also cause a deficiency. Getting enough vitamin B12 can be hard for elderly people. Sometimes, they do not eat a well-balanced diet. The elderly are also more likely than younger people to have medical conditions or take medications that can lead to a deficiency. WHAT DOES VITAMIN B12 DO? Vitamin B12 does many things to help the body work right:  It helps the body make healthy red blood cells.  It helps maintain nerve cells.  It is involved in the body's process of converting food into energy (metabolism).  It is needed to make the genetic material in all cells (DNA). VITAMIN B12 FOOD SOURCES Most people get plenty of vitamin B12 through the foods they eat. It is present in:  Meat, fish, poultry, and eggs.  Milk and milk products.  It also is added when certain foods are made, including some breads, cereals and yogurts. The food is then called "fortified". CAUSES The most common causes of vitamin B12 deficiency are:  Pernicious anemia. The condition develops when the body cannot make enough healthy red blood cells. This stems from a lack of a protein made in the stomach (intrinsic factor). People without this protein cannot absorb enough vitamin B12 from food.  Malabsorption. This is when the body cannot absorb the vitamin. It can be caused by:  Pernicious anemia.  Surgery to remove part or all of the stomach can lead to malabsorption. Removal of part or all of the small intestine can also cause malabsorption.  Vegetarian diet.  People who are strict about not eating foods from animals could have trouble taking in enough vitamin B12 from diet alone.  Medications. Some medicines have been linked to B12 deficiency, such as Metformin (a drug prescribed for type 2 diabetes). Long-term use of stomach acid suppressants also can keep the vitamin from being absorbed.  Intestinal problems such as inflammatory bowel disease. If there are problems in the digestive tract, vitamin B12 may not be absorbed in good enough amounts. SYMPTOMS People who do not get enough B12 can develop problems. These can include:  Anemia. This is when the body has too few red blood cells. Red blood cells carry oxygen to the rest of the body. Without a healthy supply of red blood cells, people can feel:  Tired (fatigued).  Weak.  Severe anemia can cause:  Shortness of breath.  Dizziness.  Rapid heart rate.  Paleness.  Other Vitamin B12 deficiency symptoms include:  Diarrhea.  Numbness or tingling in the hands or feet.  Loss of appetite.  Confusion.  Sores on the tongue or in the mouth. LET YOUR CAREGIVER KNOW ABOUT:  Any allergies. It is very important to know if you are allergic or sensitive to cobalt. Vitamin B12 contains cobalt.  Any history of kidney disease.  All medications you are taking. Include prescription and over-the-counter medicines, herbs and creams.  Whether you are pregnant or breast-feeding.  If you have Leber's disease, a hereditary eye condition, vitamin B12 could make it worse. RISKS AND COMPLICATIONS Reactions to an injection are   usually temporary. They might include:  Pain at the injection site.  Redness, swelling or tenderness at the site.  Headache, dizziness or weakness.  Nausea, upset stomach or diarrhea.  Numbness or tingling.  Fever.  Joint pain.  Itching or rash. If a reaction does not go away in a short while, talk with your healthcare provider. A change in the way the shots are  given, or where they are given, might need to be made. BEFORE AN INJECTION To decide whether B12 injections are right for you, your healthcare provider will probably:  Ask about your medical history.  Ask questions about your diet.  Ask about symptoms such as:  Have you felt weak?  Do you feel unusually tired?  Do you get dizzy?  Order blood tests. These may include a test to:  Check the level of red cells in your blood.  Measure B12 levels.  Check for the presence of intrinsic factor. VITAMIN B12 INJECTIONS How often you will need a vitamin B12 injection will depend on how severe your deficiency is. This also will affect how long you will need to get them. People with pernicious anemia usually get injections for their entire life. Others might get them for a shorter period. For many people, injections are given daily or weekly for several weeks. Then, once B12 levels are normal, injections are given just once a month. If the cause of the deficiency can be fixed, the injections can be stopped. Talk with your healthcare provider about what you should expect. For an injection:  The injection site will be cleaned with an alcohol swab.  Your healthcare provider will insert a needle directly into a muscle. Most any muscle can be used. Most often, an arm muscle is used. A buttocks muscle can also be used. Many people say shots in that area are less painful.  A small adhesive bandage may be put over the injection site. It usually can be taken off in an hour or less. Injections can be given by your healthcare provider. In some cases, family members give them. Sometimes, people give them to themselves. Talk with your healthcare provider about what would be best for you. If someone other than your healthcare provider will be giving the shots, the person will need to be trained to give them correctly. HOME CARE INSTRUCTIONS   You can remove the adhesive bandage within an hour of getting a  shot.  You should be able to go about your normal activities right away.  Avoid drinking large amounts of alcohol while taking vitamin B12 shots. Alcohol can interfere with the body's use of the vitamin. SEEK MEDICAL CARE IF:   Pain, redness, swelling or tenderness at the injection site does not get better or gets worse.  Headache, dizziness or weakness does not go away.  You develop a fever of more than 100.5 F (38.1 C). SEEK IMMEDIATE MEDICAL CARE IF:   You have chest pain.  You develop shortness of breath.  You have muscle weakness that gets worse.  You develop numbness, weakness or tingling on one side or one area of the body.  You have symptoms of an allergic reaction, such as:  Hives.  Difficulty breathing.  Swelling of the lips, face, tongue or throat.  You develop a fever of more than 102.0 F (38.9 C). MAKE SURE YOU:   Understand these instructions.  Will watch your condition.  Will get help right away if you are not doing well or get worse. Document   Released: 06/07/2008 Document Revised: 06/03/2011 Document Reviewed: 06/07/2008 Exit[employer] Patient Information 2014 [employer].  

## 2013-04-14 NOTE — Telephone Encounter (Signed)
Pt request to reestablish, last ov was 2007. Pt stated she need physical done and pt has BCBS for insurance. Please advise.

## 2013-04-15 NOTE — Telephone Encounter (Signed)
Left vm for pt to call back and schedule a physical with Dr. Macario GoldsPlot.

## 2013-04-15 NOTE — Telephone Encounter (Signed)
Ok Thx 

## 2013-04-19 ENCOUNTER — Ambulatory Visit (INDEPENDENT_AMBULATORY_CARE_PROVIDER_SITE_OTHER): Payer: BC Managed Care – PPO | Admitting: Internal Medicine

## 2013-04-19 ENCOUNTER — Encounter: Payer: Self-pay | Admitting: Internal Medicine

## 2013-04-19 VITALS — BP 142/86 | HR 76 | Temp 97.8°F | Resp 16 | Ht 66.5 in | Wt 188.0 lb

## 2013-04-19 DIAGNOSIS — D510 Vitamin B12 deficiency anemia due to intrinsic factor deficiency: Secondary | ICD-10-CM

## 2013-04-19 DIAGNOSIS — Z0000 Encounter for general adult medical examination without abnormal findings: Secondary | ICD-10-CM | POA: Insufficient documentation

## 2013-04-19 DIAGNOSIS — Z23 Encounter for immunization: Secondary | ICD-10-CM

## 2013-04-19 DIAGNOSIS — IMO0001 Reserved for inherently not codable concepts without codable children: Secondary | ICD-10-CM

## 2013-04-19 MED ORDER — VITAMIN D 1000 UNITS PO TABS: 1000.0000 [IU] | ORAL_TABLET | Freq: Every day | ORAL | Status: AC

## 2013-04-19 MED ORDER — CYANOCOBALAMIN 1000 MCG/ML IJ SOLN: 1000.0000 ug | INTRAMUSCULAR | Status: DC

## 2013-04-19 NOTE — Progress Notes (Signed)
   Subjective:    HPI  The patient is here for a wellness exam - NEW PT. The patient has been doing well overall without major physical or psychological issues going on lately. The patient needs to address  chronic B12 def, controlled with medical treatment, congestion.  BP Readings from Last 3 Encounters:  04/19/13 142/86  04/14/13 134/56  03/17/13 114/65   Wt Readings from Last 3 Encounters:  04/19/13 188 lb (85.276 kg)  09/29/12 184 lb 6.4 oz (83.643 kg)  10/02/11 190 lb 14.4 oz (86.592 kg)      Review of Systems  Constitutional: Negative for fever, chills, diaphoresis, activity change, appetite change, fatigue and unexpected weight change.  HENT: Positive for rhinorrhea and sinus pressure. Negative for congestion, dental problem, ear pain, hearing loss, mouth sores, postnasal drip, sneezing, sore throat and voice change.   Eyes: Negative for pain and visual disturbance.  Respiratory: Negative for cough, chest tightness, wheezing and stridor.   Cardiovascular: Negative for chest pain, palpitations and leg swelling.  Gastrointestinal: Negative for nausea, vomiting, abdominal pain, blood in stool, abdominal distention and rectal pain.  Genitourinary: Negative for dysuria, hematuria, decreased urine volume, vaginal bleeding, vaginal discharge, difficulty urinating, vaginal pain and menstrual problem.  Musculoskeletal: Negative for back pain, gait problem, joint swelling and neck pain.  Skin: Negative for color change, rash and wound.  Neurological: Negative for dizziness, tremors, syncope, speech difficulty and light-headedness.  Hematological: Negative for adenopathy.  Psychiatric/Behavioral: Negative for suicidal ideas, hallucinations, behavioral problems, confusion, sleep disturbance, dysphoric mood and decreased concentration. The patient is not hyperactive.        Objective:   Physical Exam  Constitutional: She appears well-developed. No distress.  HENT:  Head:  Normocephalic.  Right Ear: External ear normal.  Left Ear: External ear normal.  Nose: Nose normal.  Mouth/Throat: Oropharynx is clear and moist.  Eyes: Conjunctivae are normal. Pupils are equal, round, and reactive to light. Right eye exhibits no discharge. Left eye exhibits no discharge.  Neck: Normal range of motion. Neck supple. No JVD present. No tracheal deviation present. No thyromegaly present.  Cardiovascular: Normal rate, regular rhythm and normal heart sounds.   Pulmonary/Chest: No stridor. No respiratory distress. She has no wheezes.  Abdominal: Soft. Bowel sounds are normal. She exhibits no distension and no mass. There is no tenderness. There is no rebound and no guarding.  Musculoskeletal: She exhibits no edema and no tenderness.  Lymphadenopathy:    She has no cervical adenopathy.  Neurological: She displays normal reflexes. No cranial nerve deficit. She exhibits normal muscle tone. Coordination normal.  Skin: No rash noted. No erythema.  Psychiatric: She has a normal mood and affect. Her behavior is normal. Judgment and thought content normal.    Lab Results  Component Value Date   WBC 6.0 09/29/2012   HGB 12.2 09/29/2012   HCT 36.8 09/29/2012   PLT 226 09/29/2012   TSH 0.748 10/03/2010         Assessment & Plan:

## 2013-04-19 NOTE — Assessment & Plan Note (Signed)
Continue with current prescription therapy as reflected on the Med list.  

## 2013-04-19 NOTE — Assessment & Plan Note (Signed)
We discussed age appropriate health related issues, including available/recomended screening tests and vaccinations. We discussed a need for adhering to healthy diet and exercise. Labs/EKG were reviewed/ordered. All questions were answered. Colon  Dr Matthias HughsBuccini - due ?2017 Flu tdap

## 2013-04-19 NOTE — Progress Notes (Signed)
Pre visit review using our clinic review tool, if applicable. No additional management support is needed unless otherwise documented below in the visit note. 

## 2013-04-21 ENCOUNTER — Other Ambulatory Visit (INDEPENDENT_AMBULATORY_CARE_PROVIDER_SITE_OTHER): Payer: BC Managed Care – PPO

## 2013-04-21 DIAGNOSIS — Z0000 Encounter for general adult medical examination without abnormal findings: Secondary | ICD-10-CM

## 2013-04-21 LAB — CBC WITH DIFFERENTIAL/PLATELET
BASOS PCT: 0.3 % (ref 0.0–3.0)
Basophils Absolute: 0 10*3/uL (ref 0.0–0.1)
EOS ABS: 0.3 10*3/uL (ref 0.0–0.7)
Eosinophils Relative: 5.2 % — ABNORMAL HIGH (ref 0.0–5.0)
HCT: 40 % (ref 36.0–46.0)
Hemoglobin: 12.5 g/dL (ref 12.0–15.0)
Lymphocytes Relative: 15.8 % (ref 12.0–46.0)
Lymphs Abs: 1 10*3/uL (ref 0.7–4.0)
MCHC: 31.3 g/dL (ref 30.0–36.0)
MCV: 84.1 fl (ref 78.0–100.0)
MONO ABS: 0.6 10*3/uL (ref 0.1–1.0)
Monocytes Relative: 10 % (ref 3.0–12.0)
NEUTROS PCT: 68.7 % (ref 43.0–77.0)
Neutro Abs: 4.2 10*3/uL (ref 1.4–7.7)
PLATELETS: 242 10*3/uL (ref 150.0–400.0)
RBC: 4.75 Mil/uL (ref 3.87–5.11)
RDW: 13.2 % (ref 11.5–14.6)
WBC: 6.2 10*3/uL (ref 4.5–10.5)

## 2013-04-21 LAB — URINALYSIS
Bilirubin Urine: NEGATIVE
Hgb urine dipstick: NEGATIVE
KETONES UR: NEGATIVE
Leukocytes, UA: NEGATIVE
Nitrite: NEGATIVE
PH: 6 (ref 5.0–8.0)
Specific Gravity, Urine: 1.025 (ref 1.000–1.030)
Total Protein, Urine: NEGATIVE
Urine Glucose: NEGATIVE
Urobilinogen, UA: 0.2 (ref 0.0–1.0)

## 2013-04-21 LAB — BASIC METABOLIC PANEL
BUN: 15 mg/dL (ref 6–23)
CHLORIDE: 107 meq/L (ref 96–112)
CO2: 28 meq/L (ref 19–32)
CREATININE: 0.8 mg/dL (ref 0.4–1.2)
Calcium: 9.7 mg/dL (ref 8.4–10.5)
GFR: 93.47 mL/min (ref >60.00)
Glucose, Bld: 95 mg/dL (ref 70–99)
Potassium: 4.3 mEq/L (ref 3.5–5.1)
Sodium: 140 mEq/L (ref 135–145)

## 2013-04-21 LAB — LIPID PANEL
CHOL/HDL RATIO: 3
CHOLESTEROL: 197 mg/dL (ref 0–200)
HDL: 62.5 mg/dL (ref >39.00)
LDL Cholesterol: 119 mg/dL — ABNORMAL HIGH (ref 0–99)
TRIGLYCERIDES: 78 mg/dL (ref 0.0–149.0)
VLDL: 15.6 mg/dL (ref 0.0–40.0)

## 2013-04-21 LAB — HEPATIC FUNCTION PANEL
ALT: 27 U/L (ref 0–35)
AST: 23 U/L (ref 0–37)
Albumin: 4.1 g/dL (ref 3.5–5.2)
Alkaline Phosphatase: 103 U/L (ref 39–117)
BILIRUBIN TOTAL: 0.6 mg/dL (ref 0.3–1.2)
Bilirubin, Direct: 0 mg/dL (ref 0.0–0.3)
TOTAL PROTEIN: 7.5 g/dL (ref 6.0–8.3)

## 2013-04-21 LAB — TSH: TSH: 1.4 u[IU]/mL (ref 0.35–5.50)

## 2013-05-11 ENCOUNTER — Ambulatory Visit: Payer: BC Managed Care – PPO

## 2013-05-12 ENCOUNTER — Ambulatory Visit: Payer: BC Managed Care – PPO

## 2013-05-21 ENCOUNTER — Ambulatory Visit
Admission: RE | Admit: 2013-05-21 | Discharge: 2013-05-21 | Disposition: A | Discharge status: Home / Self Care | Payer: BC Managed Care – PPO | Source: Ambulatory Visit | Attending: Oncology | Admitting: Oncology

## 2013-05-21 DIAGNOSIS — D510 Vitamin B12 deficiency anemia due to intrinsic factor deficiency: Secondary | ICD-10-CM

## 2013-06-09 ENCOUNTER — Ambulatory Visit (HOSPITAL_BASED_OUTPATIENT_CLINIC_OR_DEPARTMENT_OTHER): Payer: BC Managed Care – PPO

## 2013-06-09 VITALS — BP 104/67 | HR 55 | Temp 97.4°F

## 2013-06-09 DIAGNOSIS — D510 Vitamin B12 deficiency anemia due to intrinsic factor deficiency: Secondary | ICD-10-CM

## 2013-06-09 MED ORDER — CYANOCOBALAMIN 1000 MCG/ML IJ SOLN
1000.0000 ug | Freq: Once | INTRAMUSCULAR | Status: AC
  Administered 2013-06-09: 1000 ug via INTRAMUSCULAR

## 2013-07-07 ENCOUNTER — Ambulatory Visit (HOSPITAL_BASED_OUTPATIENT_CLINIC_OR_DEPARTMENT_OTHER): Payer: BC Managed Care – PPO

## 2013-07-07 VITALS — BP 120/53 | HR 65 | Temp 97.8°F

## 2013-07-07 DIAGNOSIS — D510 Vitamin B12 deficiency anemia due to intrinsic factor deficiency: Secondary | ICD-10-CM

## 2013-07-07 MED ORDER — CYANOCOBALAMIN 1000 MCG/ML IJ SOLN
1000.0000 ug | Freq: Once | INTRAMUSCULAR | Status: AC
  Administered 2013-07-07: 1000 ug via INTRAMUSCULAR

## 2013-08-04 ENCOUNTER — Ambulatory Visit (HOSPITAL_BASED_OUTPATIENT_CLINIC_OR_DEPARTMENT_OTHER): Payer: BC Managed Care – PPO

## 2013-08-04 VITALS — BP 119/59 | HR 70 | Temp 97.6°F

## 2013-08-04 DIAGNOSIS — D510 Vitamin B12 deficiency anemia due to intrinsic factor deficiency: Secondary | ICD-10-CM

## 2013-08-04 MED ORDER — CYANOCOBALAMIN 1000 MCG/ML IJ SOLN
1000.0000 ug | Freq: Once | INTRAMUSCULAR | Status: AC
  Administered 2013-08-04: 1000 ug via INTRAMUSCULAR

## 2013-08-31 ENCOUNTER — Other Ambulatory Visit: Payer: Self-pay | Admitting: *Deleted

## 2013-08-31 DIAGNOSIS — D510 Vitamin B12 deficiency anemia due to intrinsic factor deficiency: Secondary | ICD-10-CM

## 2013-09-01 ENCOUNTER — Ambulatory Visit: Payer: BC Managed Care – PPO

## 2013-09-01 ENCOUNTER — Other Ambulatory Visit: Payer: BC Managed Care – PPO

## 2013-09-22 ENCOUNTER — Telehealth: Payer: Self-pay | Admitting: *Deleted

## 2013-09-22 ENCOUNTER — Other Ambulatory Visit: Payer: Self-pay | Admitting: *Deleted

## 2013-09-22 DIAGNOSIS — D510 Vitamin B12 deficiency anemia due to intrinsic factor deficiency: Secondary | ICD-10-CM

## 2013-09-22 NOTE — Telephone Encounter (Signed)
Spoke with patient and informed her of Kristen Nielsen's departure.  Confirmed new appointment for 09/29/13 at 1000 for labs and 1030 with Dr. Gerrit HallsSehbai with an injection to follow.

## 2013-09-28 ENCOUNTER — Telehealth: Payer: Self-pay | Admitting: Oncology

## 2013-09-28 NOTE — Telephone Encounter (Signed)
DUE TO NO COVERAGE 7/8  APPT MOVED FROM CP2 TO CB - S/W PT SHE IS AWARE.  °

## 2013-09-29 ENCOUNTER — Telehealth: Payer: Self-pay | Admitting: Oncology

## 2013-09-29 ENCOUNTER — Encounter: Payer: Self-pay | Admitting: Nurse Practitioner

## 2013-09-29 ENCOUNTER — Ambulatory Visit (HOSPITAL_BASED_OUTPATIENT_CLINIC_OR_DEPARTMENT_OTHER): Payer: BC Managed Care – PPO

## 2013-09-29 ENCOUNTER — Ambulatory Visit (HOSPITAL_BASED_OUTPATIENT_CLINIC_OR_DEPARTMENT_OTHER): Payer: BC Managed Care – PPO | Admitting: Nurse Practitioner

## 2013-09-29 ENCOUNTER — Other Ambulatory Visit (HOSPITAL_BASED_OUTPATIENT_CLINIC_OR_DEPARTMENT_OTHER): Payer: BC Managed Care – PPO

## 2013-09-29 VITALS — BP 118/64 | HR 57 | Temp 97.7°F | Resp 18 | Ht 66.0 in | Wt 181.3 lb

## 2013-09-29 DIAGNOSIS — D510 Vitamin B12 deficiency anemia due to intrinsic factor deficiency: Secondary | ICD-10-CM

## 2013-09-29 DIAGNOSIS — J302 Other seasonal allergic rhinitis: Secondary | ICD-10-CM | POA: Insufficient documentation

## 2013-09-29 LAB — CBC WITH DIFFERENTIAL/PLATELET
BASO%: 0.6 % (ref 0.0–2.0)
BASOS ABS: 0 10*3/uL (ref 0.0–0.1)
EOS%: 5.4 % (ref 0.0–7.0)
Eosinophils Absolute: 0.3 10*3/uL (ref 0.0–0.5)
HEMATOCRIT: 39 % (ref 34.8–46.6)
HEMOGLOBIN: 12.2 g/dL (ref 11.6–15.9)
LYMPH#: 1.1 10*3/uL (ref 0.9–3.3)
LYMPH%: 19.6 % (ref 14.0–49.7)
MCH: 26.1 pg (ref 25.1–34.0)
MCHC: 31.4 g/dL — AB (ref 31.5–36.0)
MCV: 83.2 fL (ref 79.5–101.0)
MONO#: 0.6 10*3/uL (ref 0.1–0.9)
MONO%: 11.1 % (ref 0.0–14.0)
NEUT#: 3.6 10*3/uL (ref 1.5–6.5)
NEUT%: 63.3 % (ref 38.4–76.8)
PLATELETS: 231 10*3/uL (ref 145–400)
RBC: 4.69 10*6/uL (ref 3.70–5.45)
RDW: 13.5 % (ref 11.2–14.5)
WBC: 5.7 10*3/uL (ref 3.9–10.3)

## 2013-09-29 LAB — COMPREHENSIVE METABOLIC PANEL (CC13)
ALT: 19 U/L (ref 0–55)
ANION GAP: 5 meq/L (ref 3–11)
AST: 19 U/L (ref 5–34)
Albumin: 3.8 g/dL (ref 3.5–5.0)
Alkaline Phosphatase: 113 U/L (ref 40–150)
BUN: 16.6 mg/dL (ref 7.0–26.0)
CALCIUM: 9.6 mg/dL (ref 8.4–10.4)
CO2: 26 meq/L (ref 22–29)
CREATININE: 0.8 mg/dL (ref 0.6–1.1)
Chloride: 109 mEq/L (ref 98–109)
Glucose: 101 mg/dl (ref 70–140)
Potassium: 4.5 mEq/L (ref 3.5–5.1)
Sodium: 141 mEq/L (ref 136–145)
Total Bilirubin: 0.43 mg/dL (ref 0.20–1.20)
Total Protein: 7 g/dL (ref 6.4–8.3)

## 2013-09-29 MED ORDER — CYANOCOBALAMIN 1000 MCG/ML IJ SOLN
1000.0000 ug | Freq: Once | INTRAMUSCULAR | Status: AC
  Administered 2013-09-29: 1000 ug via INTRAMUSCULAR

## 2013-09-29 NOTE — Assessment & Plan Note (Addendum)
Patient to receive vitamin B 12 injection 1,000 mcg today; and will plan to return in 1 month for next injection. Return in 6 months for labs which will include CBC/diff and CMET.  Return in 1 year for next folllow up exam.

## 2013-09-29 NOTE — Telephone Encounter (Signed)
gv adn rpnted appt sched and avs for pt for Aug thru July 2016

## 2013-09-29 NOTE — Assessment & Plan Note (Signed)
Hx of chronic seasonal allergies; and associated nasal congestion. Has tried Zyrtec and Flonase nasal spray in past; with only minimal effect. Has also seen ENT and had sinus CT in past. Was previously receiving allergy injections; but discontinued these several years ago. Patient stated she has no interest in further tx of her chronic allergies at this time.

## 2013-09-29 NOTE — Progress Notes (Signed)
Chief Complaint  Patient presents with  . Pernicious Anemia    HISTORY: Kristen Nielsen 63 y.o. female presents today for follow up of her pernicious anemia; and to receive her monthly B12 injection. Pt continues to c/o chronic seasonal allergies; but is happy to report that her previous right hand pain has resolved.  She has also now established care with a new PCP Dr. Posey ReaPlotnikov as well. Still plans on establishing with a new GYN since her GYN retired. No other new issues or concerns.   History reviewed. No pertinent past medical history.  has Pernicious anemia; Well adult; and Seasonal allergies on her problem list.     has No Known Allergies.    Medication List       This list is accurate as of: 09/29/13  2:05 PM.  Always use your most recent med list.               acetaminophen 325 MG tablet  Commonly known as:  TYLENOL  Take 650 mg by mouth as needed.     cholecalciferol 1000 UNITS tablet  Commonly known as:  VITAMIN D  Take 1 tablet (1,000 Units total) by mouth daily.     conjugated estrogens vaginal cream  Commonly known as:  PREMARIN  Place 1 Applicatorful vaginally daily.     cyanocobalamin 1000 MCG/ML injection  Commonly known as:  COBAL-1000  Inject 1 mL (1,000 mcg total) into the muscle every 30 (thirty) days.         Past Surgical History  Procedure Laterality Date  . Tonsillectomy and adenoidectomy      Patient denies any headaches, dizziness, double vision, fevers, chills, night sweats, nausea, vomiting, diarrhea, constipation, chest pain, heart palpitations, shortness of breath, blood in stool, black tarry stool, urinary pain, urinary burning, urinary frequency, hematuria.    PHYSICAL EXAMINATION  Filed Vitals:   09/29/13 1013  BP: 118/64  Pulse: 57  Temp: 97.7 F (36.5 C)  Resp: 18    GENERAL:alert, healthy, no distress, well nourished and well developed SKIN: skin color, texture, turgor are normal, no rashes or significant  lesions HEAD: Normocephalic EYES: PERRLA, Conjunctiva are pink and non-injected OROPHARYNX:lips, buccal mucosa, and tongue normal  NECK: no adenopathy, no bruits, no JVD LYMPH:  no palpable lymphadenopathy BREAST:not examined LUNGS: negative findings:  chest symmetric with normal A/P diameter, normal respiratory rate and rhythm, lungs clear to auscultation HEART: regular rate & rhythm, no murmurs and no gallops ABDOMEN:abdomen soft, non-tender, normal bowel sounds and no masses or organomegaly BACK: No CVA tenderness, Range of motion is normal EXTREMITIES:no edema, no clubbing, no cyanosis  NEURO: alert & oriented x 3 with fluent speech, gait normal  LABORATORY DATA: CBC    Component Value Date/Time   WBC 5.7 09/29/2013 0956   WBC 6.2 04/21/2013 0846   RBC 4.69 09/29/2013 0956   RBC 4.75 04/21/2013 0846   HGB 12.2 09/29/2013 0956   HGB 12.5 04/21/2013 0846   HCT 39.0 09/29/2013 0956   HCT 40.0 04/21/2013 0846   PLT 231 09/29/2013 0956   PLT 242.0 04/21/2013 0846   MCV 83.2 09/29/2013 0956   MCV 84.1 04/21/2013 0846   MCH 26.1 09/29/2013 0956   MCHC 31.4* 09/29/2013 0956   MCHC 31.3 04/21/2013 0846   RDW 13.5 09/29/2013 0956   RDW 13.2 04/21/2013 0846   LYMPHSABS 1.1 09/29/2013 0956   LYMPHSABS 1.0 04/21/2013 0846   MONOABS 0.6 09/29/2013 0956   MONOABS 0.6 04/21/2013 0846  EOSABS 0.3 09/29/2013 0956   EOSABS 0.3 04/21/2013 0846   BASOSABS 0.0 09/29/2013 0956   BASOSABS 0.0 04/21/2013 0846        Component Value Date/Time   NA 141 09/29/2013 0956   NA 140 04/21/2013 0846   K 4.5 09/29/2013 0956   K 4.3 04/21/2013 0846   CL 107 04/21/2013 0846   CO2 26 09/29/2013 0956   CO2 28 04/21/2013 0846   GLUCOSE 101 09/29/2013 0956   GLUCOSE 95 04/21/2013 0846   BUN 16.6 09/29/2013 0956   BUN 15 04/21/2013 0846   CREATININE 0.8 09/29/2013 0956   CREATININE 0.8 04/21/2013 0846   CALCIUM 9.6 09/29/2013 0956   CALCIUM 9.7 04/21/2013 0846   PROT 7.0 09/29/2013 0956   PROT 7.5 04/21/2013 0846   ALBUMIN 3.8 09/29/2013 0956   ALBUMIN  4.1 04/21/2013 0846   AST 19 09/29/2013 0956   AST 23 04/21/2013 0846   ALT 19 09/29/2013 0956   ALT 27 04/21/2013 0846   ALKPHOS 113 09/29/2013 0956   ALKPHOS 103 04/21/2013 0846   BILITOT 0.43 09/29/2013 0956   BILITOT 0.6 04/21/2013 0846   ASSESSMENT/PLAN:      Pernicious anemia  Assessment & Plan Patient to receive vitamin B 12 injection 1,000 mcg today; and will plan to return in 1 month for next injection. Return in 6 months for labs which will include CBC/diff and CMET.  Return in 1 year for next folllow up exam.    Seasonal allergies  Assessment & Plan Hx of chronic seasonal allergies; and associated nasal congestion. Has tried Zyrtec and Flonase nasal spray in past; with only minimal effect. Has also seen ENT and had sinus CT in past. Was previously receiving allergy injections; but discontinued these several years ago. Patient stated she has no interest in further tx of her chronic allergies at this time.      Patient stated understanding of all instructions; and was in agreement with this plan of care. The patient knows to call the clinic with any problems, questions or concerns.   Total time spent with patient was 30 minutes;  with greater than 75 percent of time spent in face to face counseling regarding discussion of seasonal allergy symptoms and treatment options; as well as follow up and coordination.     Payton MccallumBacon, Jaqueline Uber, NP 09/29/2013

## 2013-10-25 ENCOUNTER — Other Ambulatory Visit: Payer: Self-pay

## 2013-10-27 ENCOUNTER — Ambulatory Visit: Payer: BC Managed Care – PPO

## 2013-11-24 ENCOUNTER — Ambulatory Visit (HOSPITAL_BASED_OUTPATIENT_CLINIC_OR_DEPARTMENT_OTHER): Payer: BC Managed Care – PPO

## 2013-11-24 VITALS — BP 109/64 | HR 52 | Temp 97.7°F

## 2013-11-24 DIAGNOSIS — D510 Vitamin B12 deficiency anemia due to intrinsic factor deficiency: Secondary | ICD-10-CM

## 2013-11-24 DIAGNOSIS — Z23 Encounter for immunization: Secondary | ICD-10-CM

## 2013-11-24 MED ORDER — CYANOCOBALAMIN 1000 MCG/ML IJ SOLN
1000.0000 ug | Freq: Once | INTRAMUSCULAR | Status: AC
  Administered 2013-11-24: 1000 ug via INTRAMUSCULAR

## 2013-11-24 MED ORDER — INFLUENZA VAC SPLIT QUAD 0.5 ML IM SUSY
0.5000 mL | PREFILLED_SYRINGE | Freq: Once | INTRAMUSCULAR | Status: AC
  Administered 2013-11-24: 0.5 mL via INTRAMUSCULAR
  Filled 2013-11-24: qty 0.5

## 2013-12-22 ENCOUNTER — Ambulatory Visit (HOSPITAL_BASED_OUTPATIENT_CLINIC_OR_DEPARTMENT_OTHER): Payer: BC Managed Care – PPO

## 2013-12-22 VITALS — BP 138/84 | HR 61 | Temp 97.8°F

## 2013-12-22 DIAGNOSIS — D510 Vitamin B12 deficiency anemia due to intrinsic factor deficiency: Secondary | ICD-10-CM

## 2013-12-22 MED ORDER — CYANOCOBALAMIN 1000 MCG/ML IJ SOLN
1000.0000 ug | Freq: Once | INTRAMUSCULAR | Status: AC
  Administered 2013-12-22: 1000 ug via INTRAMUSCULAR

## 2013-12-22 NOTE — Patient Instructions (Signed)
Vitamin B12 Injections Every person needs vitamin B12. A deficiency develops when the body does not get enough of it. One way to overcome this is by getting B12 shots (injections). A B12 shot puts the vitamin directly into muscle tissue. This avoids any problems your body might have in absorbing it from food or a pill. In some people, the body has trouble using the vitamin correctly. This can cause a B12 deficiency. Not consuming enough of the vitamin can also cause a deficiency. Getting enough vitamin B12 can be hard for elderly people. Sometimes, they do not eat a well-balanced diet. The elderly are also more likely than younger people to have medical conditions or take medications that can lead to a deficiency. WHAT DOES VITAMIN B12 DO? Vitamin B12 does many things to help the body work right:  It helps the body make healthy red blood cells.  It helps maintain nerve cells.  It is involved in the body's process of converting food into energy (metabolism).  It is needed to make the genetic material in all cells (DNA). VITAMIN B12 FOOD SOURCES Most people get plenty of vitamin B12 through the foods they eat. It is present in:  Meat, fish, poultry, and eggs.  Milk and milk products.  It also is added when certain foods are made, including some breads, cereals and yogurts. The food is then called "fortified". CAUSES The most common causes of vitamin B12 deficiency are:  Pernicious anemia. The condition develops when the body cannot make enough healthy red blood cells. This stems from a lack of a protein made in the stomach (intrinsic factor). People without this protein cannot absorb enough vitamin B12 from food.  Malabsorption. This is when the body cannot absorb the vitamin. It can be caused by:  Pernicious anemia.  Surgery to remove part or all of the stomach can lead to malabsorption. Removal of part or all of the small intestine can also cause malabsorption.  Vegetarian diet.  People who are strict about not eating foods from animals could have trouble taking in enough vitamin B12 from diet alone.  Medications. Some medicines have been linked to B12 deficiency, such as Metformin (a drug prescribed for type 2 diabetes). Long-term use of stomach acid suppressants also can keep the vitamin from being absorbed.  Intestinal problems such as inflammatory bowel disease. If there are problems in the digestive tract, vitamin B12 may not be absorbed in good enough amounts. SYMPTOMS People who do not get enough B12 can develop problems. These can include:  Anemia. This is when the body has too few red blood cells. Red blood cells carry oxygen to the rest of the body. Without a healthy supply of red blood cells, people can feel:  Tired (fatigued).  Weak.  Severe anemia can cause:  Shortness of breath.  Dizziness.  Rapid heart rate.  Paleness.  Other Vitamin B12 deficiency symptoms include:  Diarrhea.  Numbness or tingling in the hands or feet.  Loss of appetite.  Confusion.  Sores on the tongue or in the mouth. LET YOUR CAREGIVER KNOW ABOUT:  Any allergies. It is very important to know if you are allergic or sensitive to cobalt. Vitamin B12 contains cobalt.  Any history of kidney disease.  All medications you are taking. Include prescription and over-the-counter medicines, herbs and creams.  Whether you are pregnant or breast-feeding.  If you have Leber's disease, a hereditary eye condition, vitamin B12 could make it worse. RISKS AND COMPLICATIONS Reactions to an injection are   usually temporary. They might include:  Pain at the injection site.  Redness, swelling or tenderness at the site.  Headache, dizziness or weakness.  Nausea, upset stomach or diarrhea.  Numbness or tingling.  Fever.  Joint pain.  Itching or rash. If a reaction does not go away in a short while, talk with your healthcare provider. A change in the way the shots are  given, or where they are given, might need to be made. BEFORE AN INJECTION To decide whether B12 injections are right for you, your healthcare provider will probably:  Ask about your medical history.  Ask questions about your diet.  Ask about symptoms such as:  Have you felt weak?  Do you feel unusually tired?  Do you get dizzy?  Order blood tests. These may include a test to:  Check the level of red cells in your blood.  Measure B12 levels.  Check for the presence of intrinsic factor. VITAMIN B12 INJECTIONS How often you will need a vitamin B12 injection will depend on how severe your deficiency is. This also will affect how long you will need to get them. People with pernicious anemia usually get injections for their entire life. Others might get them for a shorter period. For many people, injections are given daily or weekly for several weeks. Then, once B12 levels are normal, injections are given just once a month. If the cause of the deficiency can be fixed, the injections can be stopped. Talk with your healthcare provider about what you should expect. For an injection:  The injection site will be cleaned with an alcohol swab.  Your healthcare provider will insert a needle directly into a muscle. Most any muscle can be used. Most often, an arm muscle is used. A buttocks muscle can also be used. Many people say shots in that area are less painful.  A small adhesive bandage may be put over the injection site. It usually can be taken off in an hour or less. Injections can be given by your healthcare provider. In some cases, family members give them. Sometimes, people give them to themselves. Talk with your healthcare provider about what would be best for you. If someone other than your healthcare provider will be giving the shots, the person will need to be trained to give them correctly. HOME CARE INSTRUCTIONS   You can remove the adhesive bandage within an hour of getting a  shot.  You should be able to go about your normal activities right away.  Avoid drinking large amounts of alcohol while taking vitamin B12 shots. Alcohol can interfere with the body's use of the vitamin. SEEK MEDICAL CARE IF:   Pain, redness, swelling or tenderness at the injection site does not get better or gets worse.  Headache, dizziness or weakness does not go away.  You develop a fever of more than 100.5 F (38.1 C). SEEK IMMEDIATE MEDICAL CARE IF:   You have chest pain.  You develop shortness of breath.  You have muscle weakness that gets worse.  You develop numbness, weakness or tingling on one side or one area of the body.  You have symptoms of an allergic reaction, such as:  Hives.  Difficulty breathing.  Swelling of the lips, face, tongue or throat.  You develop a fever of more than 102.0 F (38.9 C). MAKE SURE YOU:   Understand these instructions.  Will watch your condition.  Will get help right away if you are not doing well or get worse. Document   Released: 06/07/2008 Document Revised: 06/03/2011 Document Reviewed: 06/07/2008 ExitCare Patient Information 2015 [employer]. This information is not intended to replace advice given to you by your health care provider. Make sure you discuss any questions you have with your health care provider.  

## 2014-01-18 ENCOUNTER — Telehealth: Payer: Self-pay | Admitting: Oncology

## 2014-01-19 ENCOUNTER — Ambulatory Visit: Payer: BC Managed Care – PPO

## 2014-01-27 ENCOUNTER — Ambulatory Visit (HOSPITAL_BASED_OUTPATIENT_CLINIC_OR_DEPARTMENT_OTHER): Payer: BC Managed Care – PPO

## 2014-01-27 DIAGNOSIS — D510 Vitamin B12 deficiency anemia due to intrinsic factor deficiency: Secondary | ICD-10-CM

## 2014-01-27 MED ORDER — CYANOCOBALAMIN 1000 MCG/ML IJ SOLN
INTRAMUSCULAR | Status: AC
  Filled 2014-01-27: qty 1

## 2014-01-27 MED ORDER — CYANOCOBALAMIN 1000 MCG/ML IJ SOLN
1000.0000 ug | Freq: Once | INTRAMUSCULAR | Status: AC
  Administered 2014-01-27: 1000 ug via INTRAMUSCULAR

## 2014-01-27 NOTE — Patient Instructions (Signed)

## 2014-02-16 ENCOUNTER — Ambulatory Visit (HOSPITAL_BASED_OUTPATIENT_CLINIC_OR_DEPARTMENT_OTHER): Payer: BC Managed Care – PPO

## 2014-02-16 DIAGNOSIS — D510 Vitamin B12 deficiency anemia due to intrinsic factor deficiency: Secondary | ICD-10-CM

## 2014-02-16 MED ORDER — CYANOCOBALAMIN 1000 MCG/ML IJ SOLN
1000.0000 ug | Freq: Once | INTRAMUSCULAR | Status: AC
  Administered 2014-02-16: 1000 ug via INTRAMUSCULAR

## 2014-03-10 ENCOUNTER — Other Ambulatory Visit: Payer: Self-pay | Admitting: Nurse Practitioner

## 2014-03-16 ENCOUNTER — Other Ambulatory Visit: Payer: Self-pay | Admitting: *Deleted

## 2014-03-16 ENCOUNTER — Ambulatory Visit (HOSPITAL_BASED_OUTPATIENT_CLINIC_OR_DEPARTMENT_OTHER): Payer: BC Managed Care – PPO

## 2014-03-16 DIAGNOSIS — D510 Vitamin B12 deficiency anemia due to intrinsic factor deficiency: Secondary | ICD-10-CM

## 2014-03-16 MED ORDER — CYANOCOBALAMIN 1000 MCG/ML IJ SOLN
1000.0000 ug | Freq: Once | INTRAMUSCULAR | Status: AC
  Administered 2014-03-16: 1000 ug via INTRAMUSCULAR

## 2014-03-16 NOTE — Patient Instructions (Signed)

## 2014-04-13 ENCOUNTER — Ambulatory Visit (HOSPITAL_BASED_OUTPATIENT_CLINIC_OR_DEPARTMENT_OTHER): Payer: BC Managed Care – PPO

## 2014-04-13 VITALS — BP 176/79 | HR 66 | Temp 98.2°F

## 2014-04-13 DIAGNOSIS — D510 Vitamin B12 deficiency anemia due to intrinsic factor deficiency: Secondary | ICD-10-CM

## 2014-04-13 MED ORDER — CYANOCOBALAMIN 1000 MCG/ML IJ SOLN
1000.0000 ug | Freq: Once | INTRAMUSCULAR | Status: AC
  Administered 2014-04-13: 1000 ug via INTRAMUSCULAR

## 2014-05-11 ENCOUNTER — Other Ambulatory Visit: Payer: Self-pay | Admitting: *Deleted

## 2014-05-11 ENCOUNTER — Ambulatory Visit (HOSPITAL_BASED_OUTPATIENT_CLINIC_OR_DEPARTMENT_OTHER): Payer: BC Managed Care – PPO

## 2014-05-11 DIAGNOSIS — D510 Vitamin B12 deficiency anemia due to intrinsic factor deficiency: Secondary | ICD-10-CM

## 2014-05-11 MED ORDER — CYANOCOBALAMIN 1000 MCG/ML IJ SOLN
1000.0000 ug | Freq: Once | INTRAMUSCULAR | Status: DC
  Administered 2014-05-11: 1000 ug via INTRAMUSCULAR

## 2014-06-08 ENCOUNTER — Ambulatory Visit: Payer: BC Managed Care – PPO

## 2014-06-08 ENCOUNTER — Other Ambulatory Visit: Payer: Self-pay | Admitting: Oncology

## 2014-07-06 ENCOUNTER — Ambulatory Visit (HOSPITAL_BASED_OUTPATIENT_CLINIC_OR_DEPARTMENT_OTHER): Payer: BC Managed Care – PPO

## 2014-07-06 VITALS — BP 116/74 | HR 57 | Temp 97.6°F

## 2014-07-06 DIAGNOSIS — D510 Vitamin B12 deficiency anemia due to intrinsic factor deficiency: Secondary | ICD-10-CM

## 2014-07-06 MED ORDER — CYANOCOBALAMIN 1000 MCG/ML IJ SOLN
1000.0000 ug | Freq: Once | INTRAMUSCULAR | Status: AC
  Administered 2014-07-06: 1000 ug via INTRAMUSCULAR

## 2014-07-20 ENCOUNTER — Other Ambulatory Visit: Payer: Self-pay

## 2014-07-20 DIAGNOSIS — Z1231 Encounter for screening mammogram for malignant neoplasm of breast: Secondary | ICD-10-CM

## 2014-08-03 ENCOUNTER — Ambulatory Visit (HOSPITAL_BASED_OUTPATIENT_CLINIC_OR_DEPARTMENT_OTHER): Payer: BC Managed Care – PPO

## 2014-08-03 VITALS — BP 127/77 | HR 55 | Temp 98.2°F

## 2014-08-03 DIAGNOSIS — D510 Vitamin B12 deficiency anemia due to intrinsic factor deficiency: Secondary | ICD-10-CM

## 2014-08-03 MED ORDER — CYANOCOBALAMIN 1000 MCG/ML IJ SOLN
1000.0000 ug | Freq: Once | INTRAMUSCULAR | Status: AC
  Administered 2014-08-03: 1000 ug via INTRAMUSCULAR

## 2014-08-10 ENCOUNTER — Ambulatory Visit
Admission: RE | Admit: 2014-08-10 | Discharge: 2014-08-10 | Disposition: A | Discharge status: Home / Self Care | Payer: BC Managed Care – PPO | Source: Ambulatory Visit

## 2014-08-10 DIAGNOSIS — Z1231 Encounter for screening mammogram for malignant neoplasm of breast: Secondary | ICD-10-CM

## 2014-08-31 ENCOUNTER — Ambulatory Visit (HOSPITAL_BASED_OUTPATIENT_CLINIC_OR_DEPARTMENT_OTHER): Payer: BC Managed Care – PPO

## 2014-08-31 VITALS — BP 143/72 | HR 60 | Temp 98.2°F

## 2014-08-31 DIAGNOSIS — D510 Vitamin B12 deficiency anemia due to intrinsic factor deficiency: Secondary | ICD-10-CM | POA: Diagnosis not present

## 2014-08-31 MED ORDER — CYANOCOBALAMIN 1000 MCG/ML IJ SOLN
1000.0000 ug | Freq: Once | INTRAMUSCULAR | Status: AC
  Administered 2014-08-31: 1000 ug via INTRAMUSCULAR

## 2014-09-27 ENCOUNTER — Telehealth: Payer: Self-pay | Admitting: Oncology

## 2014-09-27 ENCOUNTER — Encounter: Payer: Self-pay | Admitting: Certified Nurse Midwife

## 2014-09-27 ENCOUNTER — Encounter: Payer: Self-pay | Admitting: *Deleted

## 2014-09-27 ENCOUNTER — Ambulatory Visit (INDEPENDENT_AMBULATORY_CARE_PROVIDER_SITE_OTHER): Payer: BC Managed Care – PPO | Admitting: Certified Nurse Midwife

## 2014-09-27 VITALS — BP 112/62 | HR 68 | Resp 16 | Ht 65.25 in | Wt 190.0 lb

## 2014-09-27 DIAGNOSIS — Z01419 Encounter for gynecological examination (general) (routine) without abnormal findings: Secondary | ICD-10-CM | POA: Diagnosis not present

## 2014-09-27 DIAGNOSIS — Z0000 Encounter for general adult medical examination without abnormal findings: Secondary | ICD-10-CM | POA: Diagnosis not present

## 2014-09-27 DIAGNOSIS — N841 Polyp of cervix uteri: Secondary | ICD-10-CM

## 2014-09-27 DIAGNOSIS — Z124 Encounter for screening for malignant neoplasm of cervix: Secondary | ICD-10-CM

## 2014-09-27 LAB — POCT URINALYSIS DIPSTICK
Bilirubin, UA: NEGATIVE
Blood, UA: NEGATIVE
Glucose, UA: NEGATIVE
Ketones, UA: NEGATIVE
Leukocytes, UA: NEGATIVE
Nitrite, UA: NEGATIVE
Protein, UA: NEGATIVE
Urobilinogen, UA: NEGATIVE
pH, UA: 5

## 2014-09-27 NOTE — Telephone Encounter (Signed)
Left message confirming appointment rescheule from 07/06 to 07/08 per pof.

## 2014-09-27 NOTE — Progress Notes (Signed)
Left msg for pt to return call to r/s appt with Dr. Darnelle CatalanMagrinat d/t he is in clinic. Contact information given.

## 2014-09-27 NOTE — Patient Instructions (Signed)

## 2014-09-27 NOTE — Progress Notes (Signed)
64 y.o. [id number] Divorced  African American Fe here to establish and for annual exam. Sees PCP for labs and medication management if needed.. Dr. Arlice Colt manages B 12 and some labs.. Denies vaginal bleeding or vaginal dryness. Patient usual weight for her at 180-190. Has been working on exercise. Family history of breast cancer sister late 74's and paternal aunt ?60-70. She is unsure if sister had genetic screening with her breast cancer. No health issues today.   Patient's last menstrual period was 03/25/2008.          Sexually active: No.  The current method of family planning is post menopausal status.    Exercising: Yes.    yardwork Smoker:  no  Health Maintenance: Pap:  2011? Neg per patient MMG: 08-10-14 category b density, birads 1:neg Colonoscopy: 2014 normal Dr. Margret Chance ? Repeat BMD:   Yrs ago maybe 6 yrs ago  Normal TDaP:  2015 Labs: Poct urine-neg Self breast exam: done occ    reports that she has never smoked. She does not have any smokeless tobacco history on file. She reports that she does not drink alcohol or use illicit drugs.  Past Medical History  Diagnosis Date  . Anemia   . Vitamin B 12 deficiency     Past Surgical History  Procedure Laterality Date  . Tonsillectomy and adenoidectomy      Current Outpatient Prescriptions  Medication Sig Dispense Refill  . acetaminophen (TYLENOL) 325 MG tablet Take 650 mg by mouth as needed.    Marland Kitchen BAYER ASPIRIN PO Take by mouth daily.    . Cholecalciferol (VITAMIN D PO) Take 5,000 Int'l Units by mouth daily.    . cyanocobalamin (COBAL-1000) 1000 MCG/ML injection Inject 1 mL (1,000 mcg total) into the muscle every 30 (thirty) days. 10 mL 6  . conjugated estrogens (PREMARIN) vaginal cream Place 1 Applicatorful vaginally daily.     No current facility-administered medications for this visit.    Family History  Problem Relation Age of Onset  . Hypertension Mother   . Cancer Father     lung ca  . Hypertension Father   .  Diabetes Father   . Thyroid disease Father   . Breast cancer Sister     ROS:  Pertinent items are noted in HPI.  Otherwise, a comprehensive ROS was negative.  Exam:   BP 112/62 mmHg  Pulse 68  Resp 16  Ht 5' 5.25" (1.657 m)  Wt 190 lb (86.183 kg)  BMI 31.39 kg/m2  LMP 03/25/2008 Height: 5' 5.25" (165.7 cm) Ht Readings from Last 3 Encounters:  09/27/14 5' 5.25" (1.657 m)  09/29/13  (1.676 m)  04/19/13 5' 6.5" (1.689 m)    General appearance: alert, cooperative and appears stated age Head: Normocephalic, without obvious abnormality, atraumatic Neck: no adenopathy, supple, symmetrical, trachea midline and thyroid normal to inspection and palpation Lungs: clear to auscultation bilaterally Breasts: normal appearance, no masses or tenderness, No nipple retraction or dimpling, No nipple discharge or bleeding, No axillary or supraclavicular adenopathy Heart: regular rate and rhythm Abdomen: soft, non-tender; no masses,  no organomegaly Extremities: extremities normal, atraumatic, no cyanosis or edema Skin: Skin color, texture, turgor normal. No rashes or lesions Lymph nodes: Cervical, supraclavicular, and axillary nodes normal. No abnormal inguinal nodes palpated Neurologic: Grossly normal   Pelvic: External genitalia:  no lesions              Urethra:  normal appearing urethra with no masses, tenderness or lesions  Bartholin's and Skene's: normal                 Vagina: normal appearing vagina with normal color and discharge, no lesions              Cervix: normal,non tender,cervical polyp noted approximately 1 cm, no bleeding with pap              Pap taken: Yes.   Bimanual Exam:  Uterus:  normal size, contour, position, consistency, mobility, non-tender              Adnexa: normal adnexa and no mass, fullness, tenderness               Rectovaginal: Confirms               Anus:  normal sphincter tone, no lesions  Chaperone present: Yes  A:  Well Woman with  normal exam  Menopausal no HRT  Cervical polyp  Atrophic vaginitis with Premarin cream use per previous Gyn Dr. Nicholas LoseLomax, desires continuance  Vitamin B 12 deficiency with MD management  Family history of breast cancer, sister  P:   Reviewed health and wellness pertinent to exam  Discussed importance of notifying if vaginal bleeding occurs.  Discussed findings and etiology of cervical polyp as benign findings. To advise if vaginal bleeding which can occur with polyp, but generally not removed unless symptomatic. Questions addressed.  Will await pap smear and renew Premarin cream, patient aware  Continue follow up with PCP as indicated.  Encouraged patient to check on genetic screening with sister and discussed availability for her to have. Declined information at this point. Stressed yearly mammograms.  Pap smear taken with HPVHR   counseled on breast self exam, mammography screening, adequate intake of calcium and vitamin D, diet and exercise, Kegel's exercises  return annually or prn  An After Visit Summary was printed and given to the patient.

## 2014-09-28 ENCOUNTER — Ambulatory Visit: Payer: BC Managed Care – PPO

## 2014-09-28 ENCOUNTER — Other Ambulatory Visit: Payer: Self-pay | Admitting: *Deleted

## 2014-09-28 ENCOUNTER — Other Ambulatory Visit: Payer: BC Managed Care – PPO

## 2014-09-28 ENCOUNTER — Ambulatory Visit: Payer: BC Managed Care – PPO | Admitting: Oncology

## 2014-09-28 ENCOUNTER — Telehealth: Payer: Self-pay | Admitting: Oncology

## 2014-09-28 DIAGNOSIS — D510 Vitamin B12 deficiency anemia due to intrinsic factor deficiency: Secondary | ICD-10-CM

## 2014-09-28 NOTE — Telephone Encounter (Signed)
Pt confirmed labs/ov per 07/06 POF, gave pt  Calendar.... KJ

## 2014-09-28 NOTE — Progress Notes (Signed)
Encounter reviewed by Dr. Brook Amundson C. Silva.  

## 2014-09-29 NOTE — Addendum Note (Signed)
Addended by: Verner CholLEONARD, Haylyn Halberg S on: 09/29/2014 08:30 AM   Modules accepted: Level of Service, SmartSet

## 2014-09-30 ENCOUNTER — Other Ambulatory Visit: Payer: BC Managed Care – PPO

## 2014-09-30 ENCOUNTER — Ambulatory Visit: Payer: BC Managed Care – PPO | Admitting: Oncology

## 2014-09-30 ENCOUNTER — Ambulatory Visit: Payer: BC Managed Care – PPO

## 2014-09-30 LAB — IPS PAP TEST WITH HPV

## 2014-10-03 ENCOUNTER — Encounter: Payer: Self-pay | Admitting: Nurse Practitioner

## 2014-10-03 ENCOUNTER — Other Ambulatory Visit (HOSPITAL_BASED_OUTPATIENT_CLINIC_OR_DEPARTMENT_OTHER): Payer: BC Managed Care – PPO

## 2014-10-03 ENCOUNTER — Ambulatory Visit (HOSPITAL_BASED_OUTPATIENT_CLINIC_OR_DEPARTMENT_OTHER): Payer: BC Managed Care – PPO

## 2014-10-03 ENCOUNTER — Ambulatory Visit (HOSPITAL_BASED_OUTPATIENT_CLINIC_OR_DEPARTMENT_OTHER): Payer: BC Managed Care – PPO | Admitting: Nurse Practitioner

## 2014-10-03 ENCOUNTER — Telehealth: Payer: Self-pay | Admitting: Oncology

## 2014-10-03 VITALS — BP 126/63 | HR 54 | Temp 97.8°F | Resp 18 | Wt 191.0 lb

## 2014-10-03 VITALS — BP 120/57 | HR 55 | Temp 98.0°F

## 2014-10-03 DIAGNOSIS — D510 Vitamin B12 deficiency anemia due to intrinsic factor deficiency: Secondary | ICD-10-CM

## 2014-10-03 LAB — COMPREHENSIVE METABOLIC PANEL (CC13)
ALK PHOS: 123 U/L (ref 40–150)
ALT: 19 U/L (ref 0–55)
AST: 19 U/L (ref 5–34)
Albumin: 4 g/dL (ref 3.5–5.0)
Anion Gap: 6 mEq/L (ref 3–11)
BILIRUBIN TOTAL: 0.34 mg/dL (ref 0.20–1.20)
BUN: 11.6 mg/dL (ref 7.0–26.0)
CALCIUM: 9.9 mg/dL (ref 8.4–10.4)
CO2: 29 meq/L (ref 22–29)
Chloride: 108 mEq/L (ref 98–109)
Creatinine: 0.8 mg/dL (ref 0.6–1.1)
EGFR: 85 mL/min/{1.73_m2} — ABNORMAL LOW (ref >90)
GLUCOSE: 97 mg/dL (ref 70–140)
POTASSIUM: 4.3 meq/L (ref 3.5–5.1)
Sodium: 142 mEq/L (ref 136–145)
Total Protein: 7.1 g/dL (ref 6.4–8.3)

## 2014-10-03 LAB — CBC WITH DIFFERENTIAL/PLATELET
BASO%: 0.5 % (ref 0.0–2.0)
Basophils Absolute: 0 10*3/uL (ref 0.0–0.1)
EOS ABS: 0.4 10*3/uL (ref 0.0–0.5)
EOS%: 5.2 % (ref 0.0–7.0)
HCT: 39.9 % (ref 34.8–46.6)
HEMOGLOBIN: 12.6 g/dL (ref 11.6–15.9)
LYMPH%: 19 % (ref 14.0–49.7)
MCH: 26 pg (ref 25.1–34.0)
MCHC: 31.7 g/dL (ref 31.5–36.0)
MCV: 82.1 fL (ref 79.5–101.0)
MONO#: 0.8 10*3/uL (ref 0.1–0.9)
MONO%: 10.8 % (ref 0.0–14.0)
NEUT%: 64.5 % (ref 38.4–76.8)
NEUTROS ABS: 4.9 10*3/uL (ref 1.5–6.5)
Platelets: 266 10*3/uL (ref 145–400)
RBC: 4.86 10*6/uL (ref 3.70–5.45)
RDW: 13.3 % (ref 11.2–14.5)
WBC: 7.5 10*3/uL (ref 3.9–10.3)
lymph#: 1.4 10*3/uL (ref 0.9–3.3)

## 2014-10-03 MED ORDER — CYANOCOBALAMIN 1000 MCG/ML IJ SOLN
1000.0000 ug | Freq: Once | INTRAMUSCULAR | Status: AC
  Administered 2014-10-03: 1000 ug via INTRAMUSCULAR

## 2014-10-03 NOTE — Telephone Encounter (Signed)
Gave patient avs report and appointments for August 2016 thru July 2017.

## 2014-10-03 NOTE — Progress Notes (Signed)
ID: Kristen Nielsen   DOB: 06/01/1950  MR#: [id number]  CSN#:[id number]  PCP: Sonda Primes, MD GYN: Beather Arbour SU:  OTHER MD:   HISTORY OF PRESENT ILLNESS: The patient was diagnosed with pernicious anemia in 1996. She started to receive her B12 shots here since she did not have a primary care physician. She has established herself in Dr. Loren Racer office, but still gets her injection here anyway.   INTERVAL HISTORY: Kristen Nielsen returns for follow up of her pernicious anemia. She receives monthly B12 injections her at the [facility]. The interval history is generally unremarkable. She continue to do line dancing and yard work for exercise.  REVIEW OF SYSTEMS: Kristen Nielsen denies fevers, chills, nausea, vomiting, or changes in bowel or bladder habits. She has some vaginal dryness. She visited with her GYN last week and had a pap smear. If this returns normal, she will be given a prescription for premarin cream. She denies pain. She has no shortness of breath, chest pain, cough, or palpitations. Her energy level is good. A detailed review of systems is otherwise stable.   PAST MEDICAL HISTORY: Past Medical History  Diagnosis Date  . Anemia   . Vitamin B 12 deficiency     PAST SURGICAL HISTORY: Past Surgical History  Procedure Laterality Date  . Tonsillectomy and adenoidectomy      FAMILY HISTORY As of July of 2014, her parents are still alive. Her father has a history of prostate cancer. Both parents have been in the hospital recently and are now receiving home physical therapy. The patient has one sister, who was diagnosed with breast cancer the age of 38. She is now doing well. She has one brother.  GYNECOLOGIC HISTORY: GX P2, first live birth age 66. Change of life age 74. She never took hormone replacement.  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 Her daughter lives in [place], her son here in town. She has 4 grandchildren and  attends a local [address].   ADVANCED DIRECTIVES: Not in place  HEALTH MAINTENANCE: History  Substance Use Topics  . Smoking status: Never Smoker   . Smokeless tobacco: Never Used  . Alcohol Use: No     Colonoscopy: Buccini  PAP: Lomax  Bone density: 2007/ normal  Lipid panel: Lomax  No Known Allergies  Current Outpatient Prescriptions  Medication Sig Dispense Refill  . BAYER ASPIRIN PO Take by mouth daily.    . Cholecalciferol (VITAMIN D PO) Take 5,000 Int'l Units by mouth daily.    . cyanocobalamin (COBAL-1000) 1000 MCG/ML injection Inject 1 mL (1,000 mcg total) into the muscle every 30 (thirty) days. 10 mL 6  . acetaminophen (TYLENOL) 325 MG tablet Take 650 mg by mouth as needed.    . conjugated estrogens (PREMARIN) vaginal cream Place 1 Applicatorful vaginally daily.     No current facility-administered medications for this visit.    OBJECTIVE: Middle-aged [place] American woman in no acute distress Filed Vitals:   10/03/14 1110  BP: 126/63  Pulse: 54  Temp: 97.8 F (36.6 C)  Resp: 18     Body mass index is 31.55 kg/(m^2).    ECOG FS: 0  Skin: warm, dry  HEENT: sclerae anicteric, conjunctivae pink, oropharynx clear. No thrush or mucositis.  Lymph Nodes: No cervical or supraclavicular lymphadenopathy  Lungs: clear to auscultation bilaterally, no rales, wheezes, or rhonci  Heart: regular rate and rhythm  Abdomen: round, soft, non tender, positive bowel sounds  Musculoskeletal: No focal spinal tenderness, no peripheral edema  Neuro: non focal, well oriented, positive affect  Breasts: deferred  LAB RESULTS: Lab Results  Component Value Date   WBC 7.5 10/03/2014   NEUTROABS 4.9 10/03/2014   HGB 12.6 10/03/2014   HCT 39.9 10/03/2014   MCV 82.1 10/03/2014   PLT 266 10/03/2014      Chemistry      Component Value Date/Time   NA 142 10/03/2014 1046   NA 140 04/21/2013 0846      Component Value Date/Time   CALCIUM 9.9 10/03/2014 1046   CALCIUM 9.7  04/21/2013 0846       No results found for: LABCA2  No components found for: [id number]  No results for input(s): INR in the last 168 hours.  Urinalysis    Component Value Date/Time   COLORURINE YELLOW 04/21/2013 0846    STUDIES: No results found.  ASSESSMENT: 64 y.o. [place] woman with history of pernicious anemia diagnosed in 1996 with a positive intrinsic factor for blocking antibody.  Receives B12 injections on a monthly basis, due again today.   PLAN:  Riley Lamunice looks and feels great today. The labs were reviewed in detail and her hgb is normal. She will continue with monthly B12 injections indefinitely.   I advised her to take her 5000 unit vitamin D3 supplement just weekly. She was originally advised to take 1000 units daily, but could only find the 5000 unit quantity.  She will return in 1 year for labs and a follow up visit with Dr. Darnelle CatalanMagrinat. She understands and agrees with this plan. She knows the goal of treatment in her case is control. She has been encouraged to call with any issues that might arise before her next visit here.   Gwendolyn FillHeather F Verity Gilcrest    10/03/2014

## 2014-10-18 ENCOUNTER — Telehealth: Payer: Self-pay | Admitting: Certified Nurse Midwife

## 2014-10-18 ENCOUNTER — Other Ambulatory Visit: Payer: Self-pay | Admitting: Certified Nurse Midwife

## 2014-10-18 MED ORDER — ESTROGENS, CONJUGATED 0.625 MG/GM VA CREA: TOPICAL_CREAM | VAGINAL | Status: DC

## 2014-10-18 NOTE — Telephone Encounter (Signed)
Patient last seen for aex on 09/27/2014 for aex with Verner Chol CNM. Patient calling for pap smear results for refills on Premarin. Please see results and OV notes below from Verner Chol CNM.   Notes Recorded by Eliezer Bottom, CMA on 09/30/2014 at 9:19 AM aex is 7/17 Notes Recorded by Verner Chol, CNM on 09/30/2014 at 7:28 AM Pap smear negative HPVHR Not detected 02   P: Reviewed health and wellness pertinent to exam Discussed importance of notifying if vaginal bleeding occurs. Discussed findings and etiology of cervical polyp as benign findings. To advise if vaginal bleeding which can occur with polyp, but generally not removed unless symptomatic. Questions addressed. Will await pap smear and renew Premarin cream, patient aware Continue follow up with PCP as indicated. Encouraged patient to check on genetic screening with sister and discussed availability for her to have. Declined information at this point. Stressed yearly mammograms. Pap smear taken with [facility]  Left message to call Minnah Llamas at [phone].

## 2014-10-18 NOTE — Telephone Encounter (Signed)
Rx for Premarin Cream 1/2 gram pv twice per week 12RF sent to pharmacy on file.   Routing to provider for final review. Patient agreeable to disposition. Will close encounter.

## 2014-10-18 NOTE — Telephone Encounter (Signed)
Ok to refill Premarin cream.

## 2014-10-18 NOTE — Telephone Encounter (Signed)
Patient calling to see if her pap results are in. She states she was waiting on these results so she could get her medication filled.

## 2014-10-18 NOTE — Telephone Encounter (Signed)
Spoke with patient. Advised of results as seen below from Verner Chol CNM. Patient is agreeable and requests refills of Premarin be sent to pharmacy on file. Rx for Premarin is pending Verner Chol CNM review.

## 2014-10-31 ENCOUNTER — Ambulatory Visit (HOSPITAL_BASED_OUTPATIENT_CLINIC_OR_DEPARTMENT_OTHER): Payer: BC Managed Care – PPO

## 2014-10-31 VITALS — BP 120/56 | HR 60 | Temp 98.2°F

## 2014-10-31 DIAGNOSIS — D510 Vitamin B12 deficiency anemia due to intrinsic factor deficiency: Secondary | ICD-10-CM | POA: Diagnosis not present

## 2014-10-31 MED ORDER — CYANOCOBALAMIN 1000 MCG/ML IJ SOLN
1000.0000 ug | Freq: Once | INTRAMUSCULAR | Status: AC
  Administered 2014-10-31: 1000 ug via INTRAMUSCULAR

## 2014-11-29 ENCOUNTER — Ambulatory Visit (HOSPITAL_BASED_OUTPATIENT_CLINIC_OR_DEPARTMENT_OTHER): Payer: BC Managed Care – PPO

## 2014-11-29 VITALS — BP 113/65 | HR 56 | Temp 98.1°F

## 2014-11-29 DIAGNOSIS — D510 Vitamin B12 deficiency anemia due to intrinsic factor deficiency: Secondary | ICD-10-CM | POA: Diagnosis not present

## 2014-11-29 MED ORDER — CYANOCOBALAMIN 1000 MCG/ML IJ SOLN
1000.0000 ug | Freq: Once | INTRAMUSCULAR | Status: AC
  Administered 2014-11-29: 1000 ug via INTRAMUSCULAR

## 2014-12-26 ENCOUNTER — Ambulatory Visit (HOSPITAL_BASED_OUTPATIENT_CLINIC_OR_DEPARTMENT_OTHER): Payer: BC Managed Care – PPO

## 2014-12-26 DIAGNOSIS — D510 Vitamin B12 deficiency anemia due to intrinsic factor deficiency: Secondary | ICD-10-CM | POA: Diagnosis not present

## 2014-12-26 MED ORDER — CYANOCOBALAMIN 1000 MCG/ML IJ SOLN
1000.0000 ug | Freq: Once | INTRAMUSCULAR | Status: AC
  Administered 2014-12-26: 1000 ug via INTRAMUSCULAR

## 2014-12-26 NOTE — Patient Instructions (Signed)

## 2015-01-23 ENCOUNTER — Ambulatory Visit: Payer: BC Managed Care – PPO

## 2015-02-20 ENCOUNTER — Ambulatory Visit (HOSPITAL_BASED_OUTPATIENT_CLINIC_OR_DEPARTMENT_OTHER): Payer: BC Managed Care – PPO

## 2015-02-20 VITALS — BP 132/63 | HR 53 | Temp 97.7°F

## 2015-02-20 DIAGNOSIS — D510 Vitamin B12 deficiency anemia due to intrinsic factor deficiency: Secondary | ICD-10-CM | POA: Diagnosis not present

## 2015-02-20 MED ORDER — CYANOCOBALAMIN 1000 MCG/ML IJ SOLN
1000.0000 ug | Freq: Once | INTRAMUSCULAR | Status: AC
  Administered 2015-02-20: 1000 ug via INTRAMUSCULAR

## 2015-04-17 ENCOUNTER — Ambulatory Visit (HOSPITAL_BASED_OUTPATIENT_CLINIC_OR_DEPARTMENT_OTHER): Payer: BC Managed Care – PPO

## 2015-04-17 VITALS — BP 113/64 | HR 60 | Temp 98.1°F

## 2015-04-17 DIAGNOSIS — D510 Vitamin B12 deficiency anemia due to intrinsic factor deficiency: Secondary | ICD-10-CM

## 2015-04-17 MED ORDER — CYANOCOBALAMIN 1000 MCG/ML IJ SOLN
1000.0000 ug | Freq: Once | INTRAMUSCULAR | Status: AC
  Administered 2015-04-17: 1000 ug via INTRAMUSCULAR

## 2015-05-15 ENCOUNTER — Ambulatory Visit (HOSPITAL_BASED_OUTPATIENT_CLINIC_OR_DEPARTMENT_OTHER): Payer: BC Managed Care – PPO

## 2015-05-15 VITALS — BP 117/62 | HR 61 | Temp 98.2°F

## 2015-05-15 DIAGNOSIS — D510 Vitamin B12 deficiency anemia due to intrinsic factor deficiency: Secondary | ICD-10-CM

## 2015-05-15 MED ORDER — CYANOCOBALAMIN 1000 MCG/ML IJ SOLN
1000.0000 ug | Freq: Once | INTRAMUSCULAR | Status: AC
  Administered 2015-05-15: 1000 ug via INTRAMUSCULAR

## 2015-06-12 ENCOUNTER — Telehealth: Payer: Self-pay | Admitting: *Deleted

## 2015-06-12 ENCOUNTER — Ambulatory Visit: Payer: BC Managed Care – PPO

## 2015-06-12 NOTE — Telephone Encounter (Signed)
Called Kristen Nielsen about missed Vit B12 injection.  States that she forgot about appt today.  When asked about rescheduling she said that she would just wait til next month,  That she would be all right until then.

## 2015-07-10 ENCOUNTER — Ambulatory Visit (HOSPITAL_BASED_OUTPATIENT_CLINIC_OR_DEPARTMENT_OTHER): Payer: BC Managed Care – PPO

## 2015-07-10 VITALS — BP 119/68 | HR 53 | Temp 98.3°F | Resp 20

## 2015-07-10 DIAGNOSIS — D510 Vitamin B12 deficiency anemia due to intrinsic factor deficiency: Secondary | ICD-10-CM

## 2015-07-10 MED ORDER — CYANOCOBALAMIN 1000 MCG/ML IJ SOLN
1000.0000 ug | Freq: Once | INTRAMUSCULAR | Status: AC
  Administered 2015-07-10: 1000 ug via INTRAMUSCULAR

## 2015-07-10 NOTE — Patient Instructions (Signed)

## 2015-08-07 ENCOUNTER — Ambulatory Visit (HOSPITAL_BASED_OUTPATIENT_CLINIC_OR_DEPARTMENT_OTHER): Payer: BC Managed Care – PPO

## 2015-08-07 VITALS — BP 120/61 | HR 52 | Temp 98.5°F

## 2015-08-07 DIAGNOSIS — D510 Vitamin B12 deficiency anemia due to intrinsic factor deficiency: Secondary | ICD-10-CM

## 2015-08-07 MED ORDER — CYANOCOBALAMIN 1000 MCG/ML IJ SOLN
1000.0000 ug | Freq: Once | INTRAMUSCULAR | Status: AC
  Administered 2015-08-07: 1000 ug via INTRAMUSCULAR

## 2015-09-04 ENCOUNTER — Ambulatory Visit: Payer: BC Managed Care – PPO

## 2015-09-28 ENCOUNTER — Ambulatory Visit (INDEPENDENT_AMBULATORY_CARE_PROVIDER_SITE_OTHER): Payer: BC Managed Care – PPO | Admitting: Certified Nurse Midwife

## 2015-09-28 ENCOUNTER — Encounter: Payer: Self-pay | Admitting: Certified Nurse Midwife

## 2015-09-28 VITALS — BP 112/68 | HR 64 | Resp 16 | Ht 65.25 in | Wt 189.0 lb

## 2015-09-28 DIAGNOSIS — R239 Unspecified skin changes: Secondary | ICD-10-CM

## 2015-09-28 DIAGNOSIS — Z0000 Encounter for general adult medical examination without abnormal findings: Secondary | ICD-10-CM | POA: Diagnosis not present

## 2015-09-28 DIAGNOSIS — Z124 Encounter for screening for malignant neoplasm of cervix: Secondary | ICD-10-CM | POA: Diagnosis not present

## 2015-09-28 DIAGNOSIS — Z1231 Encounter for screening mammogram for malignant neoplasm of breast: Secondary | ICD-10-CM

## 2015-09-28 DIAGNOSIS — Z01419 Encounter for gynecological examination (general) (routine) without abnormal findings: Secondary | ICD-10-CM

## 2015-09-28 DIAGNOSIS — N841 Polyp of cervix uteri: Secondary | ICD-10-CM | POA: Diagnosis not present

## 2015-09-28 NOTE — Progress Notes (Signed)
Patient is scheduled with Dr. Hyacinth MeekerMiller for cervical polyp removal on 10/12/15.  [facility].  Dermatology referral entered. Patient to contacted by referrals coordinator to discuss Derm referral and coverage of cervical polyp removal.

## 2015-09-28 NOTE — Progress Notes (Signed)
65 y.o. [id number] Divorced  African American Fe here for annual exam. Menopausal no HRT. Denies vaginal bleeding. Using Premarin for vaginal dryness, which is working well. Sees Dr. Posey ReaPlotnikov yearly for aex. Sees Dr. Arlice ColtMagrinot yearly and has labs there.  Pernicious anemia stable . Patient caring for mother and just can't seem "to make her on appointments". Needs dermatology and Mammogram appointments. Request help with. No other health issues today.   Patient's last menstrual period was 03/25/2008.          Sexually active: No.  The current method of family planning is post menopausal status.    Exercising: Yes.    line dance, walking Smoker:  no  Health Maintenance: Pap:  09-27-14 neg HPV HR neg MMG:  08-10-14 category b density,birads 1:neg Colonoscopy: 2014 normal BMD:   Yrs ago normal TDaP:  2015 Shingles: no Pneumonia: no Hep C and HIV: HIV done for insurance Labs: poct urine-neg Self breast exam: done occ   reports that she has never smoked. She has never used smokeless tobacco. She reports that she drinks alcohol. She reports that she does not use illicit drugs.  Past Medical History  Diagnosis Date  . Anemia   . Vitamin B 12 deficiency     Past Surgical History  Procedure Laterality Date  . Tonsillectomy and adenoidectomy      Current Outpatient Prescriptions  Medication Sig Dispense Refill  . acetaminophen (TYLENOL) 325 MG tablet Take 650 mg by mouth as needed.    Marland Kitchen. BAYER ASPIRIN PO Take by mouth daily.    . Cholecalciferol (VITAMIN D PO) Take 5,000 Int'l Units by mouth daily.    Marland Kitchen. conjugated estrogens (PREMARIN) vaginal cream Insert 1/2 gram pv twice per week. 42.5 g 12  . cyanocobalamin (COBAL-1000) 1000 MCG/ML injection Inject 1 mL (1,000 mcg total) into the muscle every 30 (thirty) days. 10 mL 6   No current facility-administered medications for this visit.    Family History  Problem Relation Age of Onset  . Hypertension Mother   . Cancer Father     lung ca  .  Hypertension Father   . Diabetes Father   . Thyroid disease Father   . Breast cancer Sister [phone]  . Breast cancer Paternal Aunt 70    ROS:  Pertinent items are noted in HPI.  Otherwise, a comprehensive ROS was negative.  Exam:   BP 112/68 mmHg  Pulse 64  Resp 16  Ht 5' 5.25" (1.657 m)  Wt 189 lb (85.73 kg)  BMI 31.22 kg/m2  LMP 03/25/2008 Height: 5' 5.25" (165.7 cm) Ht Readings from Last 3 Encounters:  09/28/15 5' 5.25" (1.657 m)  09/27/14 5' 5.25" (1.657 m)  09/29/13 5\' 6"  (1.676 m)    General appearance: alert, cooperative and appears stated age Head: Normocephalic, without obvious abnormality, atraumatic Neck: no adenopathy, supple, symmetrical, trachea midline and thyroid normal to inspection and palpation Lungs: clear to auscultation bilaterally Breasts: normal appearance, no masses or tenderness, No nipple retraction or dimpling, No nipple discharge or bleeding, No axillary or supraclavicular adenopathy Heart: regular rate and rhythm Abdomen: soft, non-tender; no masses,  no organomegaly Extremities: extremities normal, atraumatic, no cyanosis or edema Skin: Skin color, texture, turgor normal. No rashes or lesions. Upper back on left skin color change darker with slight raised area, no exudate. Itches with touch, with scaling. Has increased per patient over past 3 months Lymph nodes: Cervical, supraclavicular, and axillary nodes normal. No abnormal inguinal nodes palpated Neurologic: Grossly normal  Pelvic: External genitalia:  no lesions              Urethra:  normal appearing urethra with no masses, tenderness or lesions              Bartholin's and Skene's: normal                 Vagina: normal appearing vagina with normal color and discharge, no lesions              Cervix: multiparous appearance, no cervical motion tenderness and cervical polyp still noted and has enlarged since last aex, no bleeding with pap smear              Pap taken: Yes.   Bimanual Exam:   Uterus:  normal size, contour, position, consistency, mobility, non-tender              Adnexa: normal adnexa and no mass, fullness, tenderness               Rectovaginal: Confirms               Anus:  normal sphincter tone, no lesions  Chaperone present: yes  A:  Well Woman with normal exam  Menopausal no HRT  Vaginal dryness/urinary frequency Premarin working well to control  Cervical polyp  Needs dermatology appt. For evaluation of skin change on upper back  Mammogram needed and due.  P:   Reviewed health and wellness pertinent to exam  Aware of need to evaluate if vaginal bleeding  Rx Premarin with instructions, uses only twice monthly  Aware of cervical polyp present ad etiology and discussed need for removal due to enlargement. Concerns with whether cervical or endometrial in origin. Questions addressed. Discussed will be scheduled and will be called with insurance infor. Preference Dr. Hyacinth MeekerMiller.  Pap smear as above taken with HPV reflex  Will schedule dermatology appointment for change in pigmentation of back and need for evaluation and mammogram appointment prior to leaving. Patient questions addressed regarding skin change and evaluation.  counseled on breast self exam, mammography screening, menopause, adequate intake of calcium and vitamin D, diet and exercise  return annually or prn  An After Visit Summary was printed and given to the patient.

## 2015-09-28 NOTE — Patient Instructions (Signed)

## 2015-09-28 NOTE — Progress Notes (Signed)
Encounter reviewed Jill Jertson, MD   

## 2015-10-01 NOTE — Progress Notes (Signed)
ID: [name]   DOB: 02-06-1951  MR#: [id number]  CSN#:[id number]  PCP: Sonda Primes, MD GYN: Beather Arbour SU:  OTHER MD:   HISTORY OF PRESENT ILLNESS: The patient was diagnosed with pernicious anemia in 1996. She started to receive her B12 shots here since she did not have a primary care physician. She has established herself in Dr. Loren Racer office, but still gets her injection here at her request  INTERVAL HISTORY: Hennesy returns for follow up of her history of B12 deficiency. She tolerates the injections without any complications.  Since her last visit here her father died from lung cancer. Danee is very active in helping her mother, who is 26.  REVIEW OF SYSTEMS: Charlize exercises chiefly by doing line dancing, and some gardening. She occasionally takes walks. Aside from low back pain which is not more intense or persistent than before, a detailed review of systems today was entirely benign  PAST MEDICAL HISTORY: Past Medical History  Diagnosis Date  . Anemia   . Vitamin B 12 deficiency     PAST SURGICAL HISTORY: Past Surgical History  Procedure Laterality Date  . Tonsillectomy and adenoidectomy      FAMILY HISTORY As of July of 2014, her parents are still alive. Her father has a history of prostate cancer. Both parents have been in the hospital recently and are now receiving home physical therapy. The patient has one sister, who was diagnosed with breast cancer the age of 107. She is now doing well. She has one brother.  GYNECOLOGIC HISTORY: GX P2, first live birth age 57. Change of life age 84.   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 Her daughter lives in [place], her son here in town. She has 4 grandchildren and attends a local [address].   ADVANCED DIRECTIVES: Not in place  HEALTH MAINTENANCE: Social History  Substance Use Topics  . Smoking status: Never Smoker   . Smokeless tobacco: Never  Used  . Alcohol Use: 0.0 - 1.2 oz/week    0-2 Standard drinks or equivalent per week     Colonoscopy: Buccini  PAP: [facility]  Bone density: 2007/ normal  Lipid panel: [facility]  No Known Allergies  Current Outpatient Prescriptions  Medication Sig Dispense Refill  . vitamin C (ASCORBIC ACID) 500 MG tablet Take 500 mg by mouth daily.    Marland Kitchen acetaminophen (TYLENOL) 325 MG tablet Take 650 mg by mouth as needed.    Marland Kitchen BAYER ASPIRIN PO Take 81 mg by mouth daily.    . Cholecalciferol (VITAMIN D PO) Take 5,000 Int'l Units by mouth daily.    Marland Kitchen conjugated estrogens (PREMARIN) vaginal cream Insert 1/2 gram pv twice per week. 42.5 g 12  . cyanocobalamin (COBAL-1000) 1000 MCG/ML injection Inject 1 mL (1,000 mcg total) into the muscle every 30 (thirty) days. 10 mL 6   No current facility-administered medications for this visit.    OBJECTIVE: Middle-aged [place] American woman Who appears well Filed Vitals:   10/02/15 1302  BP: 118/57  Pulse: 56  Temp: 98.7 F (37.1 C)  Resp: 18     Body mass index is 31.57 kg/(m^2).    ECOG FS: 0  Sclerae unicteric, pupils round and equal Oropharynx clear and moist-- no thrush or other lesions No cervical or supraclavicular adenopathy Lungs no rales or rhonchi Heart regular rate and rhythm Abd soft, nontender, positive bowel sounds MSK no focal spinal tenderness, no upper extremity lymphedema Neuro: nonfocal,  well oriented, appropriate affect Breasts: deferred   LAB RESULTS: Lab Results  Component Value Date   WBC 7.5 10/03/2014   NEUTROABS 4.9 10/03/2014   HGB 12.6 10/03/2014   HCT 39.9 10/03/2014   MCV 82.1 10/03/2014   PLT 266 10/03/2014      Chemistry      Component Value Date/Time   NA 142 10/03/2014 1046   NA 140 04/21/2013 0846      Component Value Date/Time   CALCIUM 9.9 10/03/2014 1046   CALCIUM 9.7 04/21/2013 0846       No results found for: LABCA2  No components found for: [id number]  No results for input(s): INR in the last  168 hours.  Urinalysis    Component Value Date/Time   COLORURINE YELLOW 04/21/2013 0846    STUDIES: No results found.  ASSESSMENT: 65 y.o.  woman with history of pernicious anemia diagnosed in 1996 with a positive intrinsic factor for blocking antibody.  Receives B12 injections on a monthly basis, due again today.   PLAN:  Riley Lamunice Tolerates her B12 supplementation without any complications. We will continue to provide those at her request.  She is a bit behind on her mammography but tells me she will have it done this month. Accordingly I will see her in August of next year, after her next mammogram  I have added a TSH to her yearly labs given the known association between thyroiditis and pernicious anemia  Incidentally I discussed with Riley Lamunice that unopposed estrogen can promote cancer of the uterus (not the cervix). The data for this is strong when pills or patches are used, less so for transvaginal cream use. Nevertheless there is some transvaginal systemic absorption and that is the concern. I suggested she discuss this issue with her gynecologist at her next visit.  She knows to call for any problems that may develop before her next visit here.    Gwyndolyn Guilford C    10/02/2015

## 2015-10-02 ENCOUNTER — Ambulatory Visit (HOSPITAL_BASED_OUTPATIENT_CLINIC_OR_DEPARTMENT_OTHER): Payer: BC Managed Care – PPO | Admitting: Oncology

## 2015-10-02 ENCOUNTER — Ambulatory Visit (HOSPITAL_BASED_OUTPATIENT_CLINIC_OR_DEPARTMENT_OTHER): Payer: BC Managed Care – PPO

## 2015-10-02 ENCOUNTER — Telehealth: Payer: Self-pay | Admitting: Oncology

## 2015-10-02 VITALS — BP 121/65 | HR 80 | Temp 98.4°F | Resp 18

## 2015-10-02 VITALS — BP 118/57 | HR 56 | Temp 98.7°F | Resp 18 | Ht 65.25 in | Wt 191.1 lb

## 2015-10-02 DIAGNOSIS — D510 Vitamin B12 deficiency anemia due to intrinsic factor deficiency: Secondary | ICD-10-CM | POA: Diagnosis not present

## 2015-10-02 LAB — IPS PAP TEST WITH REFLEX TO HPV

## 2015-10-02 MED ORDER — CYANOCOBALAMIN 1000 MCG/ML IJ SOLN
1000.0000 ug | Freq: Once | INTRAMUSCULAR | Status: AC
  Administered 2015-10-02: 1000 ug via INTRAMUSCULAR

## 2015-10-02 NOTE — Telephone Encounter (Signed)
appt made and avs printed °

## 2015-10-02 NOTE — Patient Instructions (Signed)

## 2015-10-06 ENCOUNTER — Ambulatory Visit
Admission: RE | Admit: 2015-10-06 | Discharge: 2015-10-06 | Disposition: A | Discharge status: Home / Self Care | Payer: BC Managed Care – PPO | Source: Ambulatory Visit | Attending: Certified Nurse Midwife | Admitting: Certified Nurse Midwife

## 2015-10-06 DIAGNOSIS — Z1231 Encounter for screening mammogram for malignant neoplasm of breast: Secondary | ICD-10-CM

## 2015-10-12 ENCOUNTER — Encounter: Payer: Self-pay | Admitting: Obstetrics & Gynecology

## 2015-10-12 ENCOUNTER — Ambulatory Visit (INDEPENDENT_AMBULATORY_CARE_PROVIDER_SITE_OTHER): Payer: BC Managed Care – PPO | Admitting: Obstetrics & Gynecology

## 2015-10-12 DIAGNOSIS — N841 Polyp of cervix uteri: Secondary | ICD-10-CM

## 2015-10-12 NOTE — Patient Instructions (Signed)
Nothing in the vagina for 24 hours.  Please call with any heavy vaginal bleeding.

## 2015-10-12 NOTE — Progress Notes (Signed)
GYNECOLOGY  VISIT   HPI: 65 y.o. [id number] Divorced [place] female with endocervical polyp here for removal.  Pt is now having any vaginal bleeding.  Recently seen by Leota Sauerseborah Leonard, CNM.  Pt reports with recent visit with Dr. Darnelle CatalanMagrinat, he discussed her use of vaginal estrogen and concerns about unopposed estrogen and risks for endometrial pathology.  Pt using vaginal estrogen two times monthly or less.  Reviewed with pt need for progesterone only if using more than two time weekly.  Reports she's never used this much.  Pt appreciates being able to discuss this.  Patient's last menstrual period was 03/25/2008.    Patient Active Problem List   Diagnosis Date Noted  . Seasonal allergies 09/29/2013  . Well adult 04/19/2013  . Pernicious anemia 01/28/2011    Past Medical History  Diagnosis Date  . Anemia   . Vitamin B 12 deficiency     Past Surgical History  Procedure Laterality Date  . Tonsillectomy and adenoidectomy      MEDS:  Reviewed in EPIC and UTD  ALLERGIES: Review of patient's allergies indicates no known allergies.  Family History  Problem Relation Age of Onset  . Hypertension Mother   . Cancer Father     lung ca  . Hypertension Father   . Diabetes Father   . Thyroid disease Father   . Breast cancer Sister [phone]  . Breast cancer Paternal Aunt [phone]    SH:  Divorced, non smoker  ROS  PHYSICAL EXAMINATION:    BP 132/70 mmHg  Pulse 78  Resp 16  Ht 5\' 6"  (1.676 m)  Wt 194 lb 3.2 oz (88.089 kg)  BMI 31.36 kg/m2  LMP 03/25/2008    General appearance: alert, cooperative and appears stated age  Pelvic: External genitalia:  no lesions              Urethra:  normal appearing urethra with no masses, tenderness or lesions              Bartholins and Skenes: normal                 Vagina: normal appearing vagina with normal color and discharge, no lesions              Cervix: no lesions and cervical polyp noted  Procedure:  Polyp grasped with polyp forceps  and instrument rotated in clockwise fashion until polyp was free.  No bleeding noted.  Polyp will be sent to pathology.  Pt tolerated procedure well.  All instruments removed.   Chaperone was present for exam.  Assessment: Endocervical polyp, s/p removal today H/O vaginal estrogen cream use Pernicious anemia  Plan: Pathology results will be called to pt.  All questions answered.

## 2015-10-17 ENCOUNTER — Telehealth: Payer: Self-pay | Admitting: Certified Nurse Midwife

## 2015-10-18 ENCOUNTER — Other Ambulatory Visit: Payer: Self-pay | Admitting: Orthopedic Surgery

## 2015-10-18 ENCOUNTER — Ambulatory Visit
Admission: RE | Admit: 2015-10-18 | Discharge: 2015-10-18 | Disposition: A | Discharge status: Home / Self Care | Payer: BC Managed Care – PPO | Source: Ambulatory Visit | Attending: Orthopedic Surgery | Admitting: Orthopedic Surgery

## 2015-10-18 DIAGNOSIS — M25562 Pain in left knee: Secondary | ICD-10-CM

## 2015-10-18 DIAGNOSIS — M7122 Synovial cyst of popliteal space [Baker], left knee: Secondary | ICD-10-CM

## 2015-10-19 NOTE — Telephone Encounter (Signed)
error 

## 2015-10-30 ENCOUNTER — Ambulatory Visit: Payer: BC Managed Care – PPO

## 2015-11-01 ENCOUNTER — Ambulatory Visit (HOSPITAL_BASED_OUTPATIENT_CLINIC_OR_DEPARTMENT_OTHER): Payer: BC Managed Care – PPO

## 2015-11-01 VITALS — BP 127/65 | HR 58 | Temp 98.1°F | Resp 18

## 2015-11-01 DIAGNOSIS — D510 Vitamin B12 deficiency anemia due to intrinsic factor deficiency: Secondary | ICD-10-CM

## 2015-11-01 MED ORDER — CYANOCOBALAMIN 1000 MCG/ML IJ SOLN
1000.0000 ug | Freq: Once | INTRAMUSCULAR | Status: AC
  Administered 2015-11-01: 1000 ug via INTRAMUSCULAR

## 2015-11-01 NOTE — Patient Instructions (Signed)

## 2015-11-28 ENCOUNTER — Ambulatory Visit (HOSPITAL_BASED_OUTPATIENT_CLINIC_OR_DEPARTMENT_OTHER): Payer: BC Managed Care – PPO

## 2015-11-28 VITALS — BP 126/71 | HR 63 | Temp 98.1°F | Resp 18

## 2015-11-28 DIAGNOSIS — D510 Vitamin B12 deficiency anemia due to intrinsic factor deficiency: Secondary | ICD-10-CM | POA: Diagnosis not present

## 2015-11-28 MED ORDER — CYANOCOBALAMIN 1000 MCG/ML IJ SOLN
1000.0000 ug | Freq: Once | INTRAMUSCULAR | Status: AC
  Administered 2015-11-28: 1000 ug via INTRAMUSCULAR

## 2015-11-28 NOTE — Patient Instructions (Signed)

## 2015-11-29 ENCOUNTER — Other Ambulatory Visit: Payer: BC Managed Care – PPO

## 2015-12-25 ENCOUNTER — Ambulatory Visit (HOSPITAL_BASED_OUTPATIENT_CLINIC_OR_DEPARTMENT_OTHER): Payer: BC Managed Care – PPO

## 2015-12-25 VITALS — BP 127/73 | HR 58 | Temp 98.2°F | Resp 20

## 2015-12-25 DIAGNOSIS — D510 Vitamin B12 deficiency anemia due to intrinsic factor deficiency: Secondary | ICD-10-CM | POA: Diagnosis not present

## 2015-12-25 MED ORDER — CYANOCOBALAMIN 1000 MCG/ML IJ SOLN
1000.0000 ug | Freq: Once | INTRAMUSCULAR | Status: AC
  Administered 2015-12-25: 1000 ug via INTRAMUSCULAR

## 2015-12-25 NOTE — Patient Instructions (Signed)

## 2016-01-29 ENCOUNTER — Telehealth: Payer: Self-pay | Admitting: Oncology

## 2016-01-29 ENCOUNTER — Ambulatory Visit: Payer: BC Managed Care – PPO

## 2016-01-29 NOTE — Telephone Encounter (Signed)
Appointments canceled per patient request.  Out of town

## 2016-02-26 ENCOUNTER — Ambulatory Visit (HOSPITAL_BASED_OUTPATIENT_CLINIC_OR_DEPARTMENT_OTHER): Payer: BC Managed Care – PPO

## 2016-02-26 VITALS — BP 125/61 | HR 56 | Temp 98.6°F | Resp 18

## 2016-02-26 DIAGNOSIS — D510 Vitamin B12 deficiency anemia due to intrinsic factor deficiency: Secondary | ICD-10-CM | POA: Diagnosis not present

## 2016-02-26 MED ORDER — CYANOCOBALAMIN 1000 MCG/ML IJ SOLN
1000.0000 ug | Freq: Once | INTRAMUSCULAR | Status: AC
  Administered 2016-02-26: 1000 ug via INTRAMUSCULAR

## 2016-02-26 NOTE — Patient Instructions (Signed)

## 2016-04-01 ENCOUNTER — Ambulatory Visit (HOSPITAL_BASED_OUTPATIENT_CLINIC_OR_DEPARTMENT_OTHER): Payer: Self-pay

## 2016-04-01 VITALS — BP 128/76 | HR 62 | Temp 98.0°F | Resp 20

## 2016-04-01 DIAGNOSIS — D510 Vitamin B12 deficiency anemia due to intrinsic factor deficiency: Secondary | ICD-10-CM

## 2016-04-01 MED ORDER — CYANOCOBALAMIN 1000 MCG/ML IJ SOLN
1000.0000 ug | Freq: Once | INTRAMUSCULAR | Status: AC
  Administered 2016-04-01: 1000 ug via INTRAMUSCULAR

## 2016-04-01 NOTE — Patient Instructions (Signed)

## 2016-04-29 ENCOUNTER — Ambulatory Visit (HOSPITAL_BASED_OUTPATIENT_CLINIC_OR_DEPARTMENT_OTHER): Payer: Medicare Other

## 2016-04-29 VITALS — BP 139/67 | HR 70 | Temp 98.0°F | Resp 18

## 2016-04-29 DIAGNOSIS — D510 Vitamin B12 deficiency anemia due to intrinsic factor deficiency: Secondary | ICD-10-CM

## 2016-04-29 MED ORDER — CYANOCOBALAMIN 1000 MCG/ML IJ SOLN
1000.0000 ug | Freq: Once | INTRAMUSCULAR | Status: AC
  Administered 2016-04-29: 1000 ug via INTRAMUSCULAR

## 2016-04-29 NOTE — Patient Instructions (Signed)

## 2016-05-27 ENCOUNTER — Ambulatory Visit: Payer: BC Managed Care – PPO

## 2016-06-24 ENCOUNTER — Ambulatory Visit (HOSPITAL_BASED_OUTPATIENT_CLINIC_OR_DEPARTMENT_OTHER): Payer: Medicare Other

## 2016-06-24 VITALS — BP 125/67 | HR 65 | Temp 98.5°F | Resp 20

## 2016-06-24 DIAGNOSIS — D510 Vitamin B12 deficiency anemia due to intrinsic factor deficiency: Secondary | ICD-10-CM | POA: Diagnosis not present

## 2016-06-24 MED ORDER — CYANOCOBALAMIN 1000 MCG/ML IJ SOLN
1000.0000 ug | Freq: Once | INTRAMUSCULAR | Status: AC
  Administered 2016-06-24: 1000 ug via INTRAMUSCULAR

## 2016-06-24 NOTE — Patient Instructions (Signed)

## 2016-07-01 ENCOUNTER — Other Ambulatory Visit: Payer: Self-pay | Admitting: Certified Nurse Midwife

## 2016-07-01 NOTE — Telephone Encounter (Signed)
Medication refill request: Conjugated Estrogens Last AEX:  09/28/15 DL Next AEX: 0/98/11 DL Last MMG (if hormonal medication request): 10/06/15 BIRADS1, Density B, TBC Refill authorized: 10/18/14 #42.5g 12R. Please advise. Thank you.   Routed to [facility] since DL is out of the office.

## 2016-07-02 NOTE — Telephone Encounter (Signed)
Routing to DL/

## 2016-07-12 ENCOUNTER — Telehealth: Payer: Self-pay | Admitting: *Deleted

## 2016-07-12 NOTE — Telephone Encounter (Signed)
Called to inform patient that her 07/30/16 appointment will begin at 0945 with Dr. Posey Rea, explaining that the first Medicare visit is performed by the physician and then annually with the nurse health coach. Patient verbalized understanding.

## 2016-07-29 ENCOUNTER — Ambulatory Visit (HOSPITAL_BASED_OUTPATIENT_CLINIC_OR_DEPARTMENT_OTHER): Payer: Medicare Other

## 2016-07-29 VITALS — BP 129/59 | HR 66 | Temp 98.7°F | Resp 18

## 2016-07-29 DIAGNOSIS — D510 Vitamin B12 deficiency anemia due to intrinsic factor deficiency: Secondary | ICD-10-CM

## 2016-07-29 MED ORDER — CYANOCOBALAMIN 1000 MCG/ML IJ SOLN
1000.0000 ug | Freq: Once | INTRAMUSCULAR | Status: AC
  Administered 2016-07-29: 1000 ug via INTRAMUSCULAR

## 2016-07-29 NOTE — Patient Instructions (Signed)
Cyanocobalamin, Vitamin B12 injection What is this medicine? CYANOCOBALAMIN (sye an oh koe BAL a min) is a man made form of vitamin B12. Vitamin B12 is used in the growth of healthy blood cells, nerve cells, and proteins in the body. It also helps with the metabolism of fats and carbohydrates. This medicine is used to treat people who can not absorb vitamin B12. This medicine may be used for other purposes; ask your health care provider or pharmacist if you have questions. COMMON BRAND NAME(S): B-12 Compliance Kit, B-12 Injection Kit, Cyomin, LA-12, Nutri-Twelve, Physicians EZ Use B-12, Primabalt What should I tell my health care provider before I take this medicine? They need to know if you have any of these conditions: -kidney disease -Leber's disease -megaloblastic anemia -an unusual or allergic reaction to cyanocobalamin, cobalt, other medicines, foods, dyes, or preservatives -pregnant or trying to get pregnant -breast-feeding How should I use this medicine? This medicine is injected into a muscle or deeply under the skin. It is usually given by a health care professional in a clinic or doctor's office. However, your doctor may teach you how to inject yourself. Follow all instructions. Talk to your pediatrician regarding the use of this medicine in children. Special care may be needed. Overdosage: If you think you have taken too much of this medicine contact a poison control center or emergency room at once. NOTE: This medicine is only for you. Do not share this medicine with others. What if I miss a dose? If you are given your dose at a clinic or doctor's office, call to reschedule your appointment. If you give your own injections and you miss a dose, take it as soon as you can. If it is almost time for your next dose, take only that dose. Do not take double or extra doses. What may interact with this medicine? -colchicine -heavy alcohol intake This list may not describe all possible  interactions. Give your health care provider a list of all the medicines, herbs, non-prescription drugs, or dietary supplements you use. Also tell them if you smoke, drink alcohol, or use illegal drugs. Some items may interact with your medicine. What should I watch for while using this medicine? Visit your doctor or health care professional regularly. You may need blood work done while you are taking this medicine. You may need to follow a special diet. Talk to your doctor. Limit your alcohol intake and avoid smoking to get the best benefit. What side effects may I notice from receiving this medicine? Side effects that you should report to your doctor or health care professional as soon as possible: -allergic reactions like skin rash, itching or hives, swelling of the face, lips, or tongue -blue tint to skin -chest tightness, pain -difficulty breathing, wheezing -dizziness -red, swollen painful area on the leg Side effects that usually do not require medical attention (report to your doctor or health care professional if they continue or are bothersome): -diarrhea -headache This list may not describe all possible side effects. Call your doctor for medical advice about side effects. You may report side effects to FDA at 1-800-FDA-1088. Where should I keep my medicine? Keep out of the reach of children. Store at room temperature between 15 and 30 degrees C (59 and 85 degrees F). Protect from light. Throw away any unused medicine after the expiration date. NOTE: This sheet is a summary. It may not cover all possible information. If you have questions about this medicine, talk to your doctor, pharmacist, or   health care provider.  2018 Elsevier/Gold Standard (2007-06-22 22:10:20)  

## 2016-07-30 ENCOUNTER — Ambulatory Visit (INDEPENDENT_AMBULATORY_CARE_PROVIDER_SITE_OTHER): Payer: Medicare Other | Admitting: Internal Medicine

## 2016-07-30 ENCOUNTER — Encounter: Payer: Self-pay | Admitting: Internal Medicine

## 2016-07-30 VITALS — BP 124/72 | HR 60 | Temp 98.6°F | Ht 66.0 in | Wt 195.1 lb

## 2016-07-30 DIAGNOSIS — G8929 Other chronic pain: Secondary | ICD-10-CM | POA: Diagnosis not present

## 2016-07-30 DIAGNOSIS — N959 Unspecified menopausal and perimenopausal disorder: Secondary | ICD-10-CM

## 2016-07-30 DIAGNOSIS — M25562 Pain in left knee: Secondary | ICD-10-CM | POA: Insufficient documentation

## 2016-07-30 DIAGNOSIS — Z23 Encounter for immunization: Secondary | ICD-10-CM | POA: Diagnosis not present

## 2016-07-30 DIAGNOSIS — Z800 Family history of malignant neoplasm of digestive organs: Secondary | ICD-10-CM | POA: Insufficient documentation

## 2016-07-30 DIAGNOSIS — Z0000 Encounter for general adult medical examination without abnormal findings: Secondary | ICD-10-CM

## 2016-07-30 DIAGNOSIS — D510 Vitamin B12 deficiency anemia due to intrinsic factor deficiency: Secondary | ICD-10-CM | POA: Diagnosis not present

## 2016-07-30 MED ORDER — ZOSTER VAC RECOMB ADJUVANTED 50 MCG/0.5ML IM SUSR: 0.5000 mL | Freq: Once | INTRAMUSCULAR | 1 refills | Status: AC

## 2016-07-30 NOTE — Assessment & Plan Note (Signed)
On Vit B12 monthly

## 2016-07-30 NOTE — Assessment & Plan Note (Signed)
f/u w/Dr Luiz BlareGraves

## 2016-07-30 NOTE — Patient Instructions (Addendum)
[facility] in 72 mo Health Maintenance for Postmenopausal Women Menopause is a normal process in which your reproductive ability comes to an end. This process happens gradually over a span of months to years, usually between the ages of 34 and 8. Menopause is complete when you have missed 12 consecutive menstrual periods. It is important to talk with your health care provider about some of the most common conditions that affect postmenopausal women, such as heart disease, cancer, and bone loss (osteoporosis). Adopting a healthy lifestyle and getting preventive care can help to promote your health and wellness. Those actions can also lower your chances of developing some of these common conditions. What should I know about menopause? During menopause, you may experience a number of symptoms, such as:  Moderate-to-severe hot flashes.  Night sweats.  Decrease in sex drive.  Mood swings.  Headaches.  Tiredness.  Irritability.  Memory problems.  Insomnia. Choosing to treat or not to treat menopausal changes is an individual decision that you make with your health care provider. What should I know about hormone replacement therapy and supplements? Hormone therapy products are effective for treating symptoms that are associated with menopause, such as hot flashes and night sweats. Hormone replacement carries certain risks, especially as you become older. If you are thinking about using estrogen or estrogen with progestin treatments, discuss the benefits and risks with your health care provider. What should I know about heart disease and stroke? Heart disease, heart attack, and stroke become more likely as you age. This may be due, in part, to the hormonal changes that your body experiences during menopause. These can affect how your body processes dietary fats, triglycerides, and cholesterol. Heart attack and stroke are both medical emergencies. There are many things that you can do to  help prevent heart disease and stroke:  Have your blood pressure checked at least every 1-2 years. High blood pressure causes heart disease and increases the risk of stroke.  If you are 38-77 years old, ask your health care provider if you should take aspirin to prevent a heart attack or a stroke.  Do not use any tobacco products, including cigarettes, chewing tobacco, or electronic cigarettes. If you need help quitting, ask your health care provider.  It is important to eat a healthy diet and maintain a healthy weight.  Be sure to include plenty of vegetables, fruits, low-fat dairy products, and lean protein.  Avoid eating foods that are high in solid fats, added sugars, or salt (sodium).  Get regular exercise. This is one of the most important things that you can do for your health.  Try to exercise for at least 150 minutes each week. The type of exercise that you do should increase your heart rate and make you sweat. This is known as moderate-intensity exercise.  Try to do strengthening exercises at least twice each week. Do these in addition to the moderate-intensity exercise.  Know your numbers.Ask your health care provider to check your cholesterol and your blood glucose. Continue to have your blood tested as directed by your health care provider. What should I know about cancer screening? There are several types of cancer. Take the following steps to reduce your risk and to catch any cancer development as early as possible. Breast Cancer  Practice breast self-awareness.  This means understanding how your breasts normally appear and feel.  It also means doing regular breast self-exams. Let your health care provider know about any changes, no matter how small.  If you are 23 or older, have a clinician do a breast exam (clinical breast exam or CBE) every year. Depending on your age, family history, and medical history, it may be recommended that you also have a yearly breast X-ray  (mammogram).  If you have a family history of breast cancer, talk with your health care provider about genetic screening.  If you are at high risk for breast cancer, talk with your health care provider about having an MRI and a mammogram every year.  Breast cancer (BRCA) gene test is recommended for women who have family members with BRCA-related cancers. Results of the assessment will determine the need for genetic counseling and BRCA1 and for BRCA2 testing. BRCA-related cancers include these types:  Breast. This occurs in males or females.  Ovarian.  Tubal. This may also be called fallopian tube cancer.  Cancer of the abdominal or pelvic lining (peritoneal cancer).  Prostate.  Pancreatic. Cervical, Uterine, and Ovarian Cancer  Your health care provider may recommend that you be screened regularly for cancer of the pelvic organs. These include your ovaries, uterus, and vagina. This screening involves a pelvic exam, which includes checking for microscopic changes to the surface of your cervix (Pap test).  For women ages 21-65, health care providers may recommend a pelvic exam and a Pap test every three years. For women ages 18-65, they may recommend the Pap test and pelvic exam, combined with testing for human papilloma virus (HPV), every five years. Some types of HPV increase your risk of cervical cancer. Testing for HPV may also be done on women of any age who have unclear Pap test results.  Other health care providers may not recommend any screening for nonpregnant women who are considered low risk for pelvic cancer and have no symptoms. Ask your health care provider if a screening pelvic exam is right for you.  If you have had past treatment for cervical cancer or a condition that could lead to cancer, you need Pap tests and screening for cancer for at least 20 years after your treatment. If Pap tests have been discontinued for you, your risk factors (such as having a new sexual  partner) need to be reassessed to determine if you should start having screenings again. Some women have medical problems that increase the chance of getting cervical cancer. In these cases, your health care provider may recommend that you have screening and Pap tests more often.  If you have a family history of uterine cancer or ovarian cancer, talk with your health care provider about genetic screening.  If you have vaginal bleeding after reaching menopause, tell your health care provider.  There are currently no reliable tests available to screen for ovarian cancer. Lung Cancer  Lung cancer screening is recommended for adults 77-68 years old who are at high risk for lung cancer because of a history of smoking. A yearly low-dose CT scan of the lungs is recommended if you:  Currently smoke.  Have a history of at least 30 pack-years of smoking and you currently smoke or have quit within the past 15 years. A pack-year is smoking an average of one pack of cigarettes per day for one year. Yearly screening should:  Continue until it has been 15 years since you quit.  Stop if you develop a health problem that would prevent you from having lung cancer treatment. Colorectal Cancer  This type of cancer can be detected and can often be prevented.  Routine colorectal cancer screening usually  begins at age 63 and continues through age 53.  If you have risk factors for colon cancer, your health care provider may recommend that you be screened at an earlier age.  If you have a family history of colorectal cancer, talk with your health care provider about genetic screening.  Your health care provider may also recommend using home test kits to check for hidden blood in your stool.  A small camera at the end of a tube can be used to examine your colon directly (sigmoidoscopy or colonoscopy). This is done to check for the earliest forms of colorectal cancer.  Direct examination of the colon should be  repeated every 5-10 years until age 80. However, if early forms of precancerous polyps or small growths are found or if you have a family history or genetic risk for colorectal cancer, you may need to be screened more often. Skin Cancer  Check your skin from head to toe regularly.  Monitor any moles. Be sure to tell your health care provider:  About any new moles or changes in moles, especially if there is a change in a mole's shape or color.  If you have a mole that is larger than the size of a pencil eraser.  If any of your family members has a history of skin cancer, especially at a young age, talk with your health care provider about genetic screening.  Always use sunscreen. Apply sunscreen liberally and repeatedly throughout the day.  Whenever you are outside, protect yourself by wearing long sleeves, pants, a wide-brimmed hat, and sunglasses. What should I know about osteoporosis? Osteoporosis is a condition in which bone destruction happens more quickly than new bone creation. After menopause, you may be at an increased risk for osteoporosis. To help prevent osteoporosis or the bone fractures that can happen because of osteoporosis, the following is recommended:  If you are 29-86 years old, get at least 1,000 mg of calcium and at least 600 mg of vitamin D per day.  If you are older than age 76 but younger than age 63, get at least 1,200 mg of calcium and at least 600 mg of vitamin D per day.  If you are older than age 76, get at least 1,200 mg of calcium and at least 800 mg of vitamin D per day. Smoking and excessive alcohol intake increase the risk of osteoporosis. Eat foods that are rich in calcium and vitamin D, and do weight-bearing exercises several times each week as directed by your health care provider. What should I know about how menopause affects my mental health? Depression may occur at any age, but it is more common as you become older. Common symptoms of depression  include:  Low or sad mood.  Changes in sleep patterns.  Changes in appetite or eating patterns.  Feeling an overall lack of motivation or enjoyment of activities that you previously enjoyed.  Frequent crying spells. Talk with your health care provider if you think that you are experiencing depression. What should I know about immunizations? It is important that you get and maintain your immunizations. These include:  Tetanus, diphtheria, and pertussis (Tdap) booster vaccine.  Influenza every year before the flu season begins.  Pneumonia vaccine.  Shingles vaccine. Your health care provider may also recommend other immunizations. This information is not intended to replace advice given to you by your health care provider. Make sure you discuss any questions you have with your health care provider. Document Released: 05/03/2005 Document Revised: 09/29/2015  Document Reviewed: 12/13/2014 Elsevier Interactive Patient Education  2017 [employer].

## 2016-07-30 NOTE — Progress Notes (Signed)
Pre visit review using our clinic review tool, if applicable. No additional management support is needed unless otherwise documented below in the visit note. 

## 2016-07-30 NOTE — Progress Notes (Signed)
Subjective:  Patient ID: Kristen Nielsen, female    DOB: [birth year]  Age: 66 y.o. MRN: [id number]  CC: No chief complaint on file.   HPI Kristen Nielsen presents for [facility] to [facility] Exam. 1 brother died of colon ca  Outpatient Medications Prior to Visit  Medication Sig Dispense Refill  . acetaminophen (TYLENOL) 325 MG tablet Take 650 mg by mouth as needed.    Marland Kitchen BAYER ASPIRIN PO Take 81 mg by mouth daily.    . Cholecalciferol (VITAMIN D PO) Take 5,000 Int'l Units by mouth daily.    . cyanocobalamin (COBAL-1000) 1000 MCG/ML injection Inject 1 mL (1,000 mcg total) into the muscle every 30 (thirty) days. 10 mL 6  . ibuprofen (ADVIL,MOTRIN) 200 MG tablet Take 200 mg by mouth every 6 (six) hours as needed.    Marland Kitchen PREMARIN vaginal cream INSERT 1/2 GRAM VAGINALLY TWICE PER WEEK. 30 g 0  . vitamin C (ASCORBIC ACID) 500 MG tablet Take 500 mg by mouth daily.     No facility-administered medications prior to visit.     ROS Review of Systems  Constitutional: Negative for activity change, appetite change, chills, fatigue and unexpected weight change.  HENT: Negative for congestion, mouth sores and sinus pressure.   Eyes: Negative for visual disturbance.  Respiratory: Negative for cough and chest tightness.   Gastrointestinal: Negative for abdominal pain and nausea.  Genitourinary: Negative for difficulty urinating, frequency and vaginal pain.  Musculoskeletal: Negative for back pain and gait problem.  Skin: Negative for pallor and rash.  Neurological: Negative for dizziness, tremors, weakness, numbness and headaches.  Psychiatric/Behavioral: Negative for confusion, sleep disturbance and suicidal ideas.    Objective:  BP 124/72 (BP Location: Left Arm, Patient Position: Sitting, Cuff Size: Large)   Pulse 60   Temp 98.6 F (37 C) (Oral)   Ht 5\' 6"  (1.676 m)   Wt 195 lb 1.3 oz (88.5 kg)   LMP 03/25/2008   SpO2 99%   BMI 31.49 kg/m   BP Readings from Last 3 Encounters:  07/30/16 124/72    07/29/16 (!) 129/59  06/24/16 125/67    Wt Readings from Last 3 Encounters:  07/30/16 195 lb 1.3 oz (88.5 kg)  10/12/15 194 lb 3.2 oz (88.1 kg)  10/02/15 191 lb 1.6 oz (86.7 kg)    Physical Exam  Constitutional: She appears well-developed. No distress.  HENT:  Head: Normocephalic.  Right Ear: External ear normal.  Left Ear: External ear normal.  Nose: Nose normal.  Mouth/Throat: Oropharynx is clear and moist.  Eyes: Conjunctivae are normal. Pupils are equal, round, and reactive to light. Right eye exhibits no discharge. Left eye exhibits no discharge.  Neck: Normal range of motion. Neck supple. No JVD present. No tracheal deviation present. No thyromegaly present.  Cardiovascular: Normal rate, regular rhythm and normal heart sounds.   Pulmonary/Chest: No stridor. No respiratory distress. She has no wheezes.  Abdominal: Soft. Bowel sounds are normal. She exhibits no distension and no mass. There is no tenderness. There is no rebound and no guarding.  Musculoskeletal: She exhibits no edema or tenderness.  Lymphadenopathy:    She has no cervical adenopathy.  Neurological: She displays normal reflexes. No cranial nerve deficit. She exhibits normal muscle tone. Coordination normal.  Skin: No rash noted. No erythema.  Psychiatric: She has a normal mood and affect. Her behavior is normal. Judgment and thought content normal.    Lab Results  Component Value Date   WBC 7.5 10/03/2014  HGB 12.6 10/03/2014   HCT 39.9 10/03/2014   PLT 266 10/03/2014   GLUCOSE 97 10/03/2014   CHOL 197 04/21/2013   TRIG 78.0 04/21/2013   HDL 62.50 04/21/2013   LDLCALC 119 (H) 04/21/2013   ALT 19 10/03/2014   AST 19 10/03/2014   NA 142 10/03/2014   K 4.3 10/03/2014   CL 107 04/21/2013   CREATININE 0.8 10/03/2014   BUN 11.6 10/03/2014   CO2 29 10/03/2014   TSH 1.40 04/21/2013    [place] Venous Img Lower Unilateral Left  Result Date: 10/18/2015 CLINICAL DATA:  Posterior left knee pain for several  weeks. EXAM: LEFT LOWER EXTREMITY VENOUS DOPPLER ULTRASOUND TECHNIQUE: Gray-scale sonography with graded compression, as well as color Doppler and duplex ultrasound were performed to evaluate the lower extremity deep venous systems from the level of the common femoral vein and including the common femoral, femoral, profunda femoral, popliteal and calf veins including the posterior tibial, peroneal and gastrocnemius veins when visible. The superficial great saphenous vein was also interrogated. Spectral Doppler was utilized to evaluate flow at rest and with distal augmentation maneuvers in the common femoral, femoral and popliteal veins. COMPARISON:  None. FINDINGS: Contralateral Common Femoral Vein: Respiratory phasicity is normal and symmetric with the symptomatic side. No evidence of thrombus. Normal compressibility. Common Femoral Vein: No evidence of thrombus. Normal compressibility, respiratory phasicity and response to augmentation. Saphenofemoral Junction: No evidence of thrombus. Normal compressibility and flow on color Doppler imaging. Profunda Femoral Vein: No evidence of thrombus. Normal compressibility and flow on color Doppler imaging. Femoral Vein: No evidence of thrombus. Normal compressibility, respiratory phasicity and response to augmentation. Popliteal Vein: No evidence of thrombus. Normal compressibility, respiratory phasicity and response to augmentation. Calf Veins: No evidence of thrombus. Normal compressibility and flow on color Doppler imaging. Superficial Great Saphenous Vein: No evidence of thrombus. Normal compressibility and flow on color Doppler imaging. Venous Reflux:  None. Other Findings: Small elongated nondistended left popliteal fossa Baker's cyst measures 4 x 0.7 x 0.8 cm. IMPRESSION: Negative for left lower extremity DVT. 4 x 0.7 x 0.8 cm left popliteal fossa nondistended Bakers cyst. Patient had a therapeutic left knee injection with steroids at the orthopedic office yesterday.  Therefore, I would recommend delaying the Baker's cyst aspiration and injection for 6 weeks. Electronically Signed   By: Judie PetitM.  Shick M.D.   On: 10/18/2015 15:51   Assessment & Plan:   There are no diagnoses linked to this encounter. I am having Ms. Stumpp maintain her acetaminophen, cyanocobalamin, BAYER ASPIRIN PO, Cholecalciferol (VITAMIN D PO), vitamin C, ibuprofen, PREMARIN, meloxicam, naproxen sodium, Homeopathic Products (LEG CRAMP RELIEF PO), and OVER THE COUNTER MEDICATION.  Meds ordered this encounter  Medications  . meloxicam (MOBIC) 15 MG tablet    Sig: TAKE 1 TABLET EVERY DAY AS NEEDED WITH FOOD FOR PAIN/INFLAMMATION    Refill:  1  . naproxen sodium (ALEVE) 220 MG tablet    Sig: Take 220 mg by mouth as needed.  . Homeopathic Products (LEG CRAMP RELIEF PO)    Sig: Take by mouth.  Marland Kitchen. OVER THE COUNTER MEDICATION    Sig: Curamin Capsules     Follow-up: No Follow-up on file.  Sonda PrimesAlex Plotnikov, MD

## 2016-07-30 NOTE — Assessment & Plan Note (Signed)
Here for medicare wellness/physical  Diet: heart healthy  Physical activity: not sedentary  Depression/mood screen: negative  Hearing: intact to whispered voice  Visual acuity: grossly normal w/glasses, performs annual eye exam  ADLs: capable  Fall risk: low to none  Home safety: good  Cognitive evaluation: intact to orientation, naming, recall and repetition  EOL planning: adv directives, full code/ I agree  I have personally reviewed and have noted  1. The patient's medical, surgical and social history  2. Their use of alcohol, tobacco or illicit drugs  3. Their current medications and supplements  4. The patient's functional ability including ADL's, fall risks, home safety risks and hearing or visual impairment.  5. Diet and physical activities  6. Evidence for depression or mood disorders 7. The roster of all physicians providing medical care to patient - is listed in the Snapshot section of the chart and reviewed today.    Today patient counseled on age appropriate routine health concerns for screening and prevention, each reviewed and up to date or declined. Immunizations reviewed and up to date or declined. Labs ordered and reviewed. Risk factors for depression reviewed and negative. Hearing function and visual acuity are intact. ADLs screened and addressed as needed. Functional ability and level of safety reviewed and appropriate. Education, counseling and referrals performed based on assessed risks today. Patient provided with a copy of personalized plan for preventive services.   GYN - Kristen Nielsen

## 2016-07-30 NOTE — Assessment & Plan Note (Addendum)
Colon w/Dr Buccini is due. She will call his office to schedule

## 2016-07-30 NOTE — Addendum Note (Signed)
Addended by: Scarlett PrestoFRIEDENBACH, Kastiel Simonian on: 07/30/2016 11:26 AM   Modules accepted: Orders

## 2016-08-06 ENCOUNTER — Ambulatory Visit (INDEPENDENT_AMBULATORY_CARE_PROVIDER_SITE_OTHER)
Admission: RE | Admit: 2016-08-06 | Discharge: 2016-08-06 | Disposition: A | Discharge status: Home / Self Care | Payer: Medicare Other | Source: Ambulatory Visit | Attending: Internal Medicine | Admitting: Internal Medicine

## 2016-08-06 DIAGNOSIS — N959 Unspecified menopausal and perimenopausal disorder: Secondary | ICD-10-CM | POA: Diagnosis not present

## 2016-08-26 ENCOUNTER — Ambulatory Visit (HOSPITAL_BASED_OUTPATIENT_CLINIC_OR_DEPARTMENT_OTHER): Payer: Medicare Other

## 2016-08-26 VITALS — BP 129/64 | HR 62 | Temp 97.8°F | Resp 20

## 2016-08-26 DIAGNOSIS — D510 Vitamin B12 deficiency anemia due to intrinsic factor deficiency: Secondary | ICD-10-CM

## 2016-08-26 MED ORDER — CYANOCOBALAMIN 1000 MCG/ML IJ SOLN
1000.0000 ug | Freq: Once | INTRAMUSCULAR | Status: AC
Start: 2016-08-26 — End: 2016-08-26
  Administered 2016-08-26: 1000 ug via INTRAMUSCULAR

## 2016-08-26 NOTE — Patient Instructions (Signed)
Cyanocobalamin, Vitamin B12 injection What is this medicine? CYANOCOBALAMIN (sye an oh koe BAL a min) is a man made form of vitamin B12. Vitamin B12 is used in the growth of healthy blood cells, nerve cells, and proteins in the body. It also helps with the metabolism of fats and carbohydrates. This medicine is used to treat people who can not absorb vitamin B12. This medicine may be used for other purposes; ask your health care provider or pharmacist if you have questions. COMMON BRAND NAME(S): B-12 Compliance Kit, B-12 Injection Kit, Cyomin, LA-12, Nutri-Twelve, Physicians EZ Use B-12, Primabalt What should I tell my health care provider before I take this medicine? They need to know if you have any of these conditions: -kidney disease -Leber's disease -megaloblastic anemia -an unusual or allergic reaction to cyanocobalamin, cobalt, other medicines, foods, dyes, or preservatives -pregnant or trying to get pregnant -breast-feeding How should I use this medicine? This medicine is injected into a muscle or deeply under the skin. It is usually given by a health care professional in a clinic or doctor's office. However, your doctor may teach you how to inject yourself. Follow all instructions. Talk to your pediatrician regarding the use of this medicine in children. Special care may be needed. Overdosage: If you think you have taken too much of this medicine contact a poison control center or emergency room at once. NOTE: This medicine is only for you. Do not share this medicine with others. What if I miss a dose? If you are given your dose at a clinic or doctor's office, call to reschedule your appointment. If you give your own injections and you miss a dose, take it as soon as you can. If it is almost time for your next dose, take only that dose. Do not take double or extra doses. What may interact with this medicine? -colchicine -heavy alcohol intake This list may not describe all possible  interactions. Give your health care provider a list of all the medicines, herbs, non-prescription drugs, or dietary supplements you use. Also tell them if you smoke, drink alcohol, or use illegal drugs. Some items may interact with your medicine. What should I watch for while using this medicine? Visit your doctor or health care professional regularly. You may need blood work done while you are taking this medicine. You may need to follow a special diet. Talk to your doctor. Limit your alcohol intake and avoid smoking to get the best benefit. What side effects may I notice from receiving this medicine? Side effects that you should report to your doctor or health care professional as soon as possible: -allergic reactions like skin rash, itching or hives, swelling of the face, lips, or tongue -blue tint to skin -chest tightness, pain -difficulty breathing, wheezing -dizziness -red, swollen painful area on the leg Side effects that usually do not require medical attention (report to your doctor or health care professional if they continue or are bothersome): -diarrhea -headache This list may not describe all possible side effects. Call your doctor for medical advice about side effects. You may report side effects to FDA at 1-800-FDA-1088. Where should I keep my medicine? Keep out of the reach of children. Store at room temperature between 15 and 30 degrees C (59 and 85 degrees F). Protect from light. Throw away any unused medicine after the expiration date. NOTE: This sheet is a summary. It may not cover all possible information. If you have questions about this medicine, talk to your doctor, pharmacist, or   health care provider.  2018 Elsevier/Gold Standard (2007-06-22 22:10:20)  

## 2016-09-23 ENCOUNTER — Ambulatory Visit (HOSPITAL_BASED_OUTPATIENT_CLINIC_OR_DEPARTMENT_OTHER): Payer: Medicare Other

## 2016-09-23 VITALS — BP 140/66 | HR 58 | Temp 97.5°F | Resp 18

## 2016-09-23 DIAGNOSIS — D510 Vitamin B12 deficiency anemia due to intrinsic factor deficiency: Secondary | ICD-10-CM | POA: Diagnosis not present

## 2016-09-23 MED ORDER — CYANOCOBALAMIN 1000 MCG/ML IJ SOLN
1000.0000 ug | Freq: Once | INTRAMUSCULAR | Status: AC
Start: 2016-09-23 — End: 2016-09-23
  Administered 2016-09-23: 1000 ug via INTRAMUSCULAR

## 2016-09-23 NOTE — Patient Instructions (Signed)
Cyanocobalamin, Vitamin B12 injection What is this medicine? CYANOCOBALAMIN (sye an oh koe BAL a min) is a man made form of vitamin B12. Vitamin B12 is used in the growth of healthy blood cells, nerve cells, and proteins in the body. It also helps with the metabolism of fats and carbohydrates. This medicine is used to treat people who can not absorb vitamin B12. This medicine may be used for other purposes; ask your health care provider or pharmacist if you have questions. COMMON BRAND NAME(S): B-12 Compliance Kit, B-12 Injection Kit, Cyomin, LA-12, Nutri-Twelve, Physicians EZ Use B-12, Primabalt What should I tell my health care provider before I take this medicine? They need to know if you have any of these conditions: -kidney disease -Leber's disease -megaloblastic anemia -an unusual or allergic reaction to cyanocobalamin, cobalt, other medicines, foods, dyes, or preservatives -pregnant or trying to get pregnant -breast-feeding How should I use this medicine? This medicine is injected into a muscle or deeply under the skin. It is usually given by a health care professional in a clinic or doctor's office. However, your doctor may teach you how to inject yourself. Follow all instructions. Talk to your pediatrician regarding the use of this medicine in children. Special care may be needed. Overdosage: If you think you have taken too much of this medicine contact a poison control center or emergency room at once. NOTE: This medicine is only for you. Do not share this medicine with others. What if I miss a dose? If you are given your dose at a clinic or doctor's office, call to reschedule your appointment. If you give your own injections and you miss a dose, take it as soon as you can. If it is almost time for your next dose, take only that dose. Do not take double or extra doses. What may interact with this medicine? -colchicine -heavy alcohol intake This list may not describe all possible  interactions. Give your health care provider a list of all the medicines, herbs, non-prescription drugs, or dietary supplements you use. Also tell them if you smoke, drink alcohol, or use illegal drugs. Some items may interact with your medicine. What should I watch for while using this medicine? Visit your doctor or health care professional regularly. You may need blood work done while you are taking this medicine. You may need to follow a special diet. Talk to your doctor. Limit your alcohol intake and avoid smoking to get the best benefit. What side effects may I notice from receiving this medicine? Side effects that you should report to your doctor or health care professional as soon as possible: -allergic reactions like skin rash, itching or hives, swelling of the face, lips, or tongue -blue tint to skin -chest tightness, pain -difficulty breathing, wheezing -dizziness -red, swollen painful area on the leg Side effects that usually do not require medical attention (report to your doctor or health care professional if they continue or are bothersome): -diarrhea -headache This list may not describe all possible side effects. Call your doctor for medical advice about side effects. You may report side effects to FDA at 1-800-FDA-1088. Where should I keep my medicine? Keep out of the reach of children. Store at room temperature between 15 and 30 degrees C (59 and 85 degrees F). Protect from light. Throw away any unused medicine after the expiration date. NOTE: This sheet is a summary. It may not cover all possible information. If you have questions about this medicine, talk to your doctor, pharmacist, or   health care provider.  2018 Elsevier/Gold Standard (2007-06-22 22:10:20)

## 2016-10-01 ENCOUNTER — Ambulatory Visit: Payer: BC Managed Care – PPO | Admitting: Certified Nurse Midwife

## 2016-10-24 ENCOUNTER — Encounter: Payer: Self-pay | Admitting: Certified Nurse Midwife

## 2016-10-24 ENCOUNTER — Ambulatory Visit (INDEPENDENT_AMBULATORY_CARE_PROVIDER_SITE_OTHER): Payer: Medicare Other | Admitting: Certified Nurse Midwife

## 2016-10-24 ENCOUNTER — Other Ambulatory Visit: Payer: Self-pay | Admitting: Certified Nurse Midwife

## 2016-10-24 VITALS — BP 108/68 | HR 64 | Resp 16 | Ht 65.0 in | Wt 188.0 lb

## 2016-10-24 DIAGNOSIS — Z1231 Encounter for screening mammogram for malignant neoplasm of breast: Secondary | ICD-10-CM

## 2016-10-24 DIAGNOSIS — N951 Menopausal and female climacteric states: Secondary | ICD-10-CM | POA: Diagnosis not present

## 2016-10-24 DIAGNOSIS — N952 Postmenopausal atrophic vaginitis: Secondary | ICD-10-CM | POA: Diagnosis not present

## 2016-10-24 DIAGNOSIS — Z01419 Encounter for gynecological examination (general) (routine) without abnormal findings: Secondary | ICD-10-CM

## 2016-10-24 NOTE — Patient Instructions (Signed)

## 2016-10-24 NOTE — Progress Notes (Signed)
66 y.o. [id number] Divorced  African American Fe here for annual exam. Menopausal no HRT, denies vaginal bleeding or vagina33l dryness. Still using Premarin cream for vaginal dryness. Sees Dr. Posey ReaPlotnikov aex and labs.  Patient's last menstrual period was 03/25/2008.          Sexually active: No.  The current method of family planning is post menopausal status.    Exercising: Yes.    walking Smoker:  no  Health Maintenance: Pap:  09-27-14 neg HPV HR neg, 09-28-15 neg History of Abnormal Pap: no MMG:  10-06-15 category b density birads 1:neg Self Breast exams: yes Colonoscopy:  2012 normal f/u 6553yrs BMD:  2018 TDaP:  2015 Shingles: 2018 Pneumonia: 2018 Hep C and HIV: HIV done for insurance Labs: none   reports that she has never smoked. She has never used smokeless tobacco. She reports that she drinks alcohol. She reports that she does not use drugs.  Past Medical History:  Diagnosis Date  . Anemia   . Vitamin B 12 deficiency     Past Surgical History:  Procedure Laterality Date  . TONSILLECTOMY AND ADENOIDECTOMY      Current Outpatient Prescriptions  Medication Sig Dispense Refill  . acetaminophen (TYLENOL) 325 MG tablet Take 650 mg by mouth as needed.    Marland Kitchen. BAYER ASPIRIN PO Take 81 mg by mouth daily.    . Cholecalciferol (VITAMIN D PO) Take 5,000 Int'l Units by mouth daily.    . cyanocobalamin (COBAL-1000) 1000 MCG/ML injection Inject 1 mL (1,000 mcg total) into the muscle every 30 (thirty) days. 10 mL 6  . Homeopathic Products (LEG CRAMP RELIEF PO) Take by mouth.    Marland Kitchen. ibuprofen (ADVIL,MOTRIN) 200 MG tablet Take 200 mg by mouth every 6 (six) hours as needed.    . meloxicam (MOBIC) 15 MG tablet TAKE 1 TABLET EVERY DAY AS NEEDED WITH FOOD FOR PAIN/INFLAMMATION  1  . naproxen sodium (ALEVE) 220 MG tablet Take 220 mg by mouth as needed.    Marland Kitchen. OVER THE COUNTER MEDICATION Curamin Capsules    . PREMARIN vaginal cream INSERT 1/2 GRAM VAGINALLY TWICE PER WEEK. 30 g 0  . vitamin C (ASCORBIC  ACID) 500 MG tablet Take 500 mg by mouth daily.     No current facility-administered medications for this visit.     Family History  Problem Relation Age of Onset  . Hypertension Mother   . Cancer Father        lung ca  . Hypertension Father   . Diabetes Father   . Thyroid disease Father   . Breast cancer Sister [phone]  . Breast cancer Paternal Aunt [phone]  . Cancer Brother 64       col ca    ROS:  Pertinent items are noted in HPI.  Otherwise, a comprehensive ROS was negative.  Exam:   BP 108/68   Pulse 64   Resp 16   Ht 5\' 5"  (1.651 m)   Wt 188 lb (85.3 kg)   LMP 03/25/2008   BMI 31.28 kg/m  Height: 5\' 5"  (165.1 cm) Ht Readings from Last 3 Encounters:  10/24/16 5\' 5"  (1.651 m)  07/30/16 5\' 6"  (1.676 m)  10/12/15 5\' 6"  (1.676 m)    General appearance: alert, cooperative and appears stated age Head: Normocephalic, without obvious abnormality, atraumatic Neck: no adenopathy, supple, symmetrical, trachea midline and thyroid normal to inspection and palpation Lungs: clear to auscultation bilaterally Breasts: normal appearance, no masses or tenderness, No nipple retraction or dimpling, No  nipple discharge or bleeding, No axillary or supraclavicular adenopathy Heart: regular rate and rhythm Abdomen: soft, non-tender; no masses,  no organomegaly Extremities: extremities normal, atraumatic, no cyanosis or edema Skin: Skin color, texture, turgor normal. No rashes or lesions Lymph nodes: Cervical, supraclavicular, and axillary nodes normal. No abnormal inguinal nodes palpated Neurologic: Grossly normal   Pelvic: External genitalia:  no lesions              Urethra:  normal appearing urethra with no masses, tenderness or lesions              Bartholin's and Skene's: normal                 Vagina: normal appearing vagina with normal color and discharge, no lesions              Cervix: no cervical motion tenderness, no lesions and normal appearance              Pap taken:  No. Bimanual Exam:  Uterus:  normal size, contour, position, consistency, mobility, non-tender              Adnexa: normal adnexa and no mass, fullness, tenderness               Rectovaginal: Confirms               Anus:  normal sphincter tone, no lesions  Chaperone present: yes  A:  Well Woman with normal exam  Menopausal no HRT  Mammogram due  Family history of breast cancer, [place] [phone], sister [phone]  Atrophic vaginitis with Premarin use working well  Pernicious anemia with oncology management  P:   Reviewed health and wellness pertinent to exam  Aware of need to advise if vaginal bleeding  Patient will be scheduled prior to leaving, stressed importance.  Discussed risks/benefits of use, desires continuance, uses prn only once every 2-3 weeks  Continue follow up with PCP as indicated.  Rx Premarin cream see order with instructions  Pap smear: no   counseled on mammography screening, feminine hygiene, menopause, adequate intake of calcium and vitamin D, diet and exercise  return annually or prn  An After Visit Summary was printed and given to the patient.

## 2016-10-24 NOTE — Progress Notes (Signed)
Scheduled screening mammogram 10-31-16 2:30pm at [facility].

## 2016-10-25 ENCOUNTER — Other Ambulatory Visit: Payer: Self-pay | Admitting: Certified Nurse Midwife

## 2016-10-25 NOTE — Telephone Encounter (Signed)
Once mammogram in will refill

## 2016-10-25 NOTE — Telephone Encounter (Signed)
Medication refill request: Premarin  Last AEX:  10-24-16 Next AEX: 10-28-17  Last MMG (if hormonal medication request): 10-06-15 WNL Patient is scheduled for 10-31-16  Refill authorized: please advise

## 2016-10-28 NOTE — Telephone Encounter (Signed)
Advised patient refill denied until mammogram report received. Advised patient to request refill again once mammogram is completed and we will review request again.  Patient agreeable to disposition. Routing to provider for final review. Will close encounter.

## 2016-10-31 ENCOUNTER — Ambulatory Visit
Admission: RE | Admit: 2016-10-31 | Discharge: 2016-10-31 | Disposition: A | Discharge status: Home / Self Care | Payer: Medicare Other | Source: Ambulatory Visit | Attending: Certified Nurse Midwife | Admitting: Certified Nurse Midwife

## 2016-10-31 DIAGNOSIS — Z1231 Encounter for screening mammogram for malignant neoplasm of breast: Secondary | ICD-10-CM

## 2016-11-04 ENCOUNTER — Ambulatory Visit (HOSPITAL_BASED_OUTPATIENT_CLINIC_OR_DEPARTMENT_OTHER): Payer: Medicare Other

## 2016-11-04 ENCOUNTER — Telehealth: Payer: Self-pay | Admitting: Oncology

## 2016-11-04 ENCOUNTER — Other Ambulatory Visit (HOSPITAL_BASED_OUTPATIENT_CLINIC_OR_DEPARTMENT_OTHER): Payer: Medicare Other

## 2016-11-04 ENCOUNTER — Ambulatory Visit (HOSPITAL_BASED_OUTPATIENT_CLINIC_OR_DEPARTMENT_OTHER): Payer: Medicare Other | Admitting: Oncology

## 2016-11-04 VITALS — BP 118/49 | HR 62 | Temp 98.2°F | Resp 18 | Ht 65.0 in | Wt 189.4 lb

## 2016-11-04 DIAGNOSIS — D510 Vitamin B12 deficiency anemia due to intrinsic factor deficiency: Secondary | ICD-10-CM | POA: Diagnosis not present

## 2016-11-04 LAB — CBC WITH DIFFERENTIAL/PLATELET
BASO%: 0.4 % (ref 0.0–2.0)
Basophils Absolute: 0 10*3/uL (ref 0.0–0.1)
EOS%: 3.3 % (ref 0.0–7.0)
Eosinophils Absolute: 0.2 10*3/uL (ref 0.0–0.5)
HCT: 39.5 % (ref 34.8–46.6)
HGB: 12.6 g/dL (ref 11.6–15.9)
LYMPH%: 19.2 % (ref 14.0–49.7)
MCH: 26.5 pg (ref 25.1–34.0)
MCHC: 31.9 g/dL (ref 31.5–36.0)
MCV: 83.1 fL (ref 79.5–101.0)
MONO#: 0.6 10*3/uL (ref 0.1–0.9)
MONO%: 8.7 % (ref 0.0–14.0)
NEUT%: 68.4 % (ref 38.4–76.8)
NEUTROS ABS: 4.7 10*3/uL (ref 1.5–6.5)
Platelets: 240 10*3/uL (ref 145–400)
RBC: 4.76 10*6/uL (ref 3.70–5.45)
RDW: 13.4 % (ref 11.2–14.5)
WBC: 6.8 10*3/uL (ref 3.9–10.3)
lymph#: 1.3 10*3/uL (ref 0.9–3.3)

## 2016-11-04 LAB — TSH: TSH: 0.789 m[IU]/L (ref 0.308–3.960)

## 2016-11-04 MED ORDER — CYANOCOBALAMIN 1000 MCG/ML IJ SOLN
1000.0000 ug | Freq: Once | INTRAMUSCULAR | Status: AC
Start: 2016-11-04 — End: 2016-11-04
  Administered 2016-11-04: 1000 ug via INTRAMUSCULAR

## 2016-11-04 NOTE — Patient Instructions (Signed)
Cyanocobalamin, Vitamin B12 injection What is this medicine? CYANOCOBALAMIN (sye an oh koe BAL a min) is a man made form of vitamin B12. Vitamin B12 is used in the growth of healthy blood cells, nerve cells, and proteins in the body. It also helps with the metabolism of fats and carbohydrates. This medicine is used to treat people who can not absorb vitamin B12. This medicine may be used for other purposes; ask your health care provider or pharmacist if you have questions. COMMON BRAND NAME(S): B-12 Compliance Kit, B-12 Injection Kit, Cyomin, LA-12, Nutri-Twelve, Physicians EZ Use B-12, Primabalt What should I tell my health care provider before I take this medicine? They need to know if you have any of these conditions: -kidney disease -Leber's disease -megaloblastic anemia -an unusual or allergic reaction to cyanocobalamin, cobalt, other medicines, foods, dyes, or preservatives -pregnant or trying to get pregnant -breast-feeding How should I use this medicine? This medicine is injected into a muscle or deeply under the skin. It is usually given by a health care professional in a clinic or doctor's office. However, your doctor may teach you how to inject yourself. Follow all instructions. Talk to your pediatrician regarding the use of this medicine in children. Special care may be needed. Overdosage: If you think you have taken too much of this medicine contact a poison control center or emergency room at once. NOTE: This medicine is only for you. Do not share this medicine with others. What if I miss a dose? If you are given your dose at a clinic or doctor's office, call to reschedule your appointment. If you give your own injections and you miss a dose, take it as soon as you can. If it is almost time for your next dose, take only that dose. Do not take double or extra doses. What may interact with this medicine? -colchicine -heavy alcohol intake This list may not describe all possible  interactions. Give your health care provider a list of all the medicines, herbs, non-prescription drugs, or dietary supplements you use. Also tell them if you smoke, drink alcohol, or use illegal drugs. Some items may interact with your medicine. What should I watch for while using this medicine? Visit your doctor or health care professional regularly. You may need blood work done while you are taking this medicine. You may need to follow a special diet. Talk to your doctor. Limit your alcohol intake and avoid smoking to get the best benefit. What side effects may I notice from receiving this medicine? Side effects that you should report to your doctor or health care professional as soon as possible: -allergic reactions like skin rash, itching or hives, swelling of the face, lips, or tongue -blue tint to skin -chest tightness, pain -difficulty breathing, wheezing -dizziness -red, swollen painful area on the leg Side effects that usually do not require medical attention (report to your doctor or health care professional if they continue or are bothersome): -diarrhea -headache This list may not describe all possible side effects. Call your doctor for medical advice about side effects. You may report side effects to FDA at 1-800-FDA-1088. Where should I keep my medicine? Keep out of the reach of children. Store at room temperature between 15 and 30 degrees C (59 and 85 degrees F). Protect from light. Throw away any unused medicine after the expiration date. NOTE: This sheet is a summary. It may not cover all possible information. If you have questions about this medicine, talk to your doctor, pharmacist, or   health care provider.  2018 Elsevier/Gold Standard (2007-06-22 22:10:20)  

## 2016-11-04 NOTE — Progress Notes (Signed)
ID: Kristen Nielsen   DOB: [birth year]  MR#: [id number]  CSN#:[id number]  PCP: Tresa Garter, MD GYN: Beather Arbour SU:  OTHER MD:   HISTORY OF PRESENT ILLNESS: From the original summary note:  The patient was diagnosed with pernicious anemia in 1996. She started to receive her B12 shots here since she did not have a primary care physician. She has established herself in Dr. Loren Racer office, but still gets her injection here at her request  INTERVAL HISTORY: Kristen Nielsen returns today for follow-up and treatment of her pernicious anemia. Interval history Nielsen unremarkable. She receives B12 supplementation monthly, with no side effects that she Nielsen aware of. She has no symptoms from the pernicious anemia itself.  REVIEW OF SYSTEMS: Kristen Nielsen spends a great deal of time taking care of her mother, who Nielsen 71. [facility] and does line dancing. A detailed review of systems today was otherwise noncontributory  PAST MEDICAL HISTORY: Past Medical History:  Diagnosis Date  . Anemia   . Vitamin B 12 deficiency     PAST SURGICAL HISTORY: Past Surgical History:  Procedure Laterality Date  . TONSILLECTOMY AND ADENOIDECTOMY      FAMILY HISTORY As of July of 2014, her parents are still alive. Her father has a history of prostate cancer. Both parents have been in the hospital recently and are now receiving home physical therapy. The patient has one sister, who was diagnosed with breast cancer the age of 52. She Nielsen now doing well. She has one brother.  GYNECOLOGIC HISTORY: GX P2, first live birth age 82. Change of life age 10.   SOCIAL HISTORY: She Nielsen a retired Freight forwarder. She Nielsen divorced and lives by herself. Her daughter lives in [place], [place], where the patient's son-in-law works as Heritage manager; the patient's son lives here in town. Kristen Nielsen has 4 grandchildren and attends a [employer].   ADVANCED DIRECTIVES: Not in place  HEALTH  MAINTENANCE: Social History  Substance Use Topics  . Smoking status: Never Smoker  . Smokeless tobacco: Never Used  . Alcohol use 0.0 - 1.2 oz/week     Colonoscopy: Buccini  PAP: [facility]  Bone density: 2007/ normal  Lipid panel: [facility]  No Known Allergies  Current Outpatient Prescriptions  Medication Sig Dispense Refill  . acetaminophen (TYLENOL) 325 MG tablet Take 650 mg by mouth as needed.    Marland Kitchen BAYER ASPIRIN PO Take 81 mg by mouth daily.    . Cholecalciferol (VITAMIN D PO) Take 5,000 Int'l Units by mouth daily.    . cyanocobalamin (COBAL-1000) 1000 MCG/ML injection Inject 1 mL (1,000 mcg total) into the muscle every 30 (thirty) days. 10 mL 6  . Homeopathic Products (LEG CRAMP RELIEF PO) Take by mouth.    Marland Kitchen ibuprofen (ADVIL,MOTRIN) 200 MG tablet Take 200 mg by mouth every 6 (six) hours as needed.    . meloxicam (MOBIC) 15 MG tablet TAKE 1 TABLET EVERY DAY AS NEEDED WITH FOOD FOR PAIN/INFLAMMATION  1  . naproxen sodium (ALEVE) 220 MG tablet Take 220 mg by mouth as needed.    Marland Kitchen OVER THE COUNTER MEDICATION Curamin Capsules    . PREMARIN vaginal cream INSERT 1/2 GRAM VAGINALLY TWICE PER WEEK. 30 g 0  . vitamin C (ASCORBIC ACID) 500 MG tablet Take 500 mg by mouth daily.     No current facility-administered medications for this visit.     OBJECTIVE: Middle-aged [place] American woman In no acute distress  Vitals:   11/04/16 1300  BP: (!) 118/49  Pulse: 62  Resp: 18  Temp: 98.2 F (36.8 C)  SpO2: 100%     Body mass index Nielsen 31.52 kg/m.    ECOG FS: 0  Sclerae unicteric, EOMs intact Oropharynx clear and moist No cervical or supraclavicular adenopathy Lungs no rales or rhonchi Heart regular rate and rhythm Abd soft, nontender, positive bowel sounds MSK no focal spinal tenderness, no upper extremity lymphedema Neuro: nonfocal, well oriented, appropriate affect Breasts: No masses or skin changes of concern noted in either breast. Both axillae are benign   LAB RESULTS: Lab  Results  Component Value Date   WBC 6.8 11/04/2016   NEUTROABS 4.7 11/04/2016   HGB 12.6 11/04/2016   HCT 39.5 11/04/2016   MCV 83.1 11/04/2016   PLT 240 11/04/2016      Chemistry      Component Value Date/Time   NA 142 10/03/2014 1046      Component Value Date/Time   CALCIUM 9.9 10/03/2014 1046       No results found for: LABCA2  No components found for: [id number]  No results for input(s): INR in the last 168 hours.  Urinalysis    Component Value Date/Time   COLORURINE YELLOW 04/21/2013 0846    STUDIES: Mm Screening Breast Tomo Bilateral  Result Date: 11/01/2016 CLINICAL DATA:  Screening. EXAM: 2D DIGITAL SCREENING BILATERAL MAMMOGRAM WITH CAD AND ADJUNCT TOMO COMPARISON:  Previous exam(s). ACR Breast Density Category b: There are scattered areas of fibroglandular density. FINDINGS: There are no findings suspicious for malignancy. Images were processed with CAD. IMPRESSION: No mammographic evidence of malignancy. A result letter of this screening mammogram will be mailed directly to the patient. RECOMMENDATION: Screening mammogram in one year. (Code:SM-B-01Y) BI-RADS CATEGORY  1: Negative. Electronically Signed   By: Frederico HammanMichelle  Collins M.D.   On: 11/01/2016 16:29    ASSESSMENT: 66 y.o. [place] woman with history of pernicious anemia diagnosed in 1996 with a positive intrinsic factor for blocking antibody.  Receives B12 injections on a monthly basis, due again today.   PLAN:  Kristen Nielsen having no problems with her B12 supplementation and she wishes to continue those monthly shots here. I'm glad to facilitate that for her.  We discussed her recent mammogram which shows low density in her breasts. This means that they are sensitive study and should find any problems early, should any problems develop.  Her main problem right now Nielsen her 65 year old mother. We discussed "this 36 hour day".  Overall though she Nielsen doing just fine and she knows to call for any problems that  may develop before the next visit here.   Kristen Nielsen C    11/04/2016

## 2016-11-04 NOTE — Telephone Encounter (Signed)
NO LOS AT TIME PT CAME TO SCHEDULING

## 2016-12-02 ENCOUNTER — Ambulatory Visit (HOSPITAL_BASED_OUTPATIENT_CLINIC_OR_DEPARTMENT_OTHER): Payer: Medicare Other

## 2016-12-02 VITALS — BP 138/63 | HR 79 | Temp 97.8°F | Resp 18

## 2016-12-02 DIAGNOSIS — D510 Vitamin B12 deficiency anemia due to intrinsic factor deficiency: Secondary | ICD-10-CM | POA: Diagnosis not present

## 2016-12-02 MED ORDER — CYANOCOBALAMIN 1000 MCG/ML IJ SOLN
1000.0000 ug | Freq: Once | INTRAMUSCULAR | Status: AC
Start: 2016-12-02 — End: 2016-12-02
  Administered 2016-12-02: 1000 ug via INTRAMUSCULAR

## 2016-12-02 NOTE — Patient Instructions (Signed)
Cyanocobalamin, Vitamin B12 injection What is this medicine? CYANOCOBALAMIN (sye an oh koe BAL a min) is a man made form of vitamin B12. Vitamin B12 is used in the growth of healthy blood cells, nerve cells, and proteins in the body. It also helps with the metabolism of fats and carbohydrates. This medicine is used to treat people who can not absorb vitamin B12. This medicine may be used for other purposes; ask your health care provider or pharmacist if you have questions. COMMON BRAND NAME(S): B-12 Compliance Kit, B-12 Injection Kit, Cyomin, LA-12, Nutri-Twelve, Physicians EZ Use B-12, Primabalt What should I tell my health care provider before I take this medicine? They need to know if you have any of these conditions: -kidney disease -Leber's disease -megaloblastic anemia -an unusual or allergic reaction to cyanocobalamin, cobalt, other medicines, foods, dyes, or preservatives -pregnant or trying to get pregnant -breast-feeding How should I use this medicine? This medicine is injected into a muscle or deeply under the skin. It is usually given by a health care professional in a clinic or doctor's office. However, your doctor may teach you how to inject yourself. Follow all instructions. Talk to your pediatrician regarding the use of this medicine in children. Special care may be needed. Overdosage: If you think you have taken too much of this medicine contact a poison control center or emergency room at once. NOTE: This medicine is only for you. Do not share this medicine with others. What if I miss a dose? If you are given your dose at a clinic or doctor's office, call to reschedule your appointment. If you give your own injections and you miss a dose, take it as soon as you can. If it is almost time for your next dose, take only that dose. Do not take double or extra doses. What may interact with this medicine? -colchicine -heavy alcohol intake This list may not describe all possible  interactions. Give your health care provider a list of all the medicines, herbs, non-prescription drugs, or dietary supplements you use. Also tell them if you smoke, drink alcohol, or use illegal drugs. Some items may interact with your medicine. What should I watch for while using this medicine? Visit your doctor or health care professional regularly. You may need blood work done while you are taking this medicine. You may need to follow a special diet. Talk to your doctor. Limit your alcohol intake and avoid smoking to get the best benefit. What side effects may I notice from receiving this medicine? Side effects that you should report to your doctor or health care professional as soon as possible: -allergic reactions like skin rash, itching or hives, swelling of the face, lips, or tongue -blue tint to skin -chest tightness, pain -difficulty breathing, wheezing -dizziness -red, swollen painful area on the leg Side effects that usually do not require medical attention (report to your doctor or health care professional if they continue or are bothersome): -diarrhea -headache This list may not describe all possible side effects. Call your doctor for medical advice about side effects. You may report side effects to FDA at 1-800-FDA-1088. Where should I keep my medicine? Keep out of the reach of children. Store at room temperature between 15 and 30 degrees C (59 and 85 degrees F). Protect from light. Throw away any unused medicine after the expiration date. NOTE: This sheet is a summary. It may not cover all possible information. If you have questions about this medicine, talk to your doctor, pharmacist, or   health care provider.  2018 Elsevier/Gold Standard (2007-06-22 22:10:20)  

## 2016-12-30 ENCOUNTER — Ambulatory Visit (HOSPITAL_BASED_OUTPATIENT_CLINIC_OR_DEPARTMENT_OTHER): Payer: Medicare Other

## 2016-12-30 VITALS — BP 134/73 | HR 61 | Temp 98.1°F | Resp 18

## 2016-12-30 DIAGNOSIS — D510 Vitamin B12 deficiency anemia due to intrinsic factor deficiency: Secondary | ICD-10-CM

## 2016-12-30 MED ORDER — CYANOCOBALAMIN 1000 MCG/ML IJ SOLN
1000.0000 ug | Freq: Once | INTRAMUSCULAR | Status: AC
Start: 2016-12-30 — End: 2016-12-30
  Administered 2016-12-30: 1000 ug via INTRAMUSCULAR

## 2016-12-30 NOTE — Patient Instructions (Signed)
Cyanocobalamin, Vitamin B12 injection What is this medicine? CYANOCOBALAMIN (sye an oh koe BAL a min) is a man made form of vitamin B12. Vitamin B12 is used in the growth of healthy blood cells, nerve cells, and proteins in the body. It also helps with the metabolism of fats and carbohydrates. This medicine is used to treat people who can not absorb vitamin B12. This medicine may be used for other purposes; ask your health care provider or pharmacist if you have questions. COMMON BRAND NAME(S): B-12 Compliance Kit, B-12 Injection Kit, Cyomin, LA-12, Nutri-Twelve, Physicians EZ Use B-12, Primabalt What should I tell my health care provider before I take this medicine? They need to know if you have any of these conditions: -kidney disease -Leber's disease -megaloblastic anemia -an unusual or allergic reaction to cyanocobalamin, cobalt, other medicines, foods, dyes, or preservatives -pregnant or trying to get pregnant -breast-feeding How should I use this medicine? This medicine is injected into a muscle or deeply under the skin. It is usually given by a health care professional in a clinic or doctor's office. However, your doctor may teach you how to inject yourself. Follow all instructions. Talk to your pediatrician regarding the use of this medicine in children. Special care may be needed. Overdosage: If you think you have taken too much of this medicine contact a poison control center or emergency room at once. NOTE: This medicine is only for you. Do not share this medicine with others. What if I miss a dose? If you are given your dose at a clinic or doctor's office, call to reschedule your appointment. If you give your own injections and you miss a dose, take it as soon as you can. If it is almost time for your next dose, take only that dose. Do not take double or extra doses. What may interact with this medicine? -colchicine -heavy alcohol intake This list may not describe all possible  interactions. Give your health care provider a list of all the medicines, herbs, non-prescription drugs, or dietary supplements you use. Also tell them if you smoke, drink alcohol, or use illegal drugs. Some items may interact with your medicine. What should I watch for while using this medicine? Visit your doctor or health care professional regularly. You may need blood work done while you are taking this medicine. You may need to follow a special diet. Talk to your doctor. Limit your alcohol intake and avoid smoking to get the best benefit. What side effects may I notice from receiving this medicine? Side effects that you should report to your doctor or health care professional as soon as possible: -allergic reactions like skin rash, itching or hives, swelling of the face, lips, or tongue -blue tint to skin -chest tightness, pain -difficulty breathing, wheezing -dizziness -red, swollen painful area on the leg Side effects that usually do not require medical attention (report to your doctor or health care professional if they continue or are bothersome): -diarrhea -headache This list may not describe all possible side effects. Call your doctor for medical advice about side effects. You may report side effects to FDA at 1-800-FDA-1088. Where should I keep my medicine? Keep out of the reach of children. Store at room temperature between 15 and 30 degrees C (59 and 85 degrees F). Protect from light. Throw away any unused medicine after the expiration date. NOTE: This sheet is a summary. It may not cover all possible information. If you have questions about this medicine, talk to your doctor, pharmacist, or   health care provider.  2018 Elsevier/Gold Standard (2007-06-22 22:10:20)  

## 2017-01-27 ENCOUNTER — Ambulatory Visit (HOSPITAL_BASED_OUTPATIENT_CLINIC_OR_DEPARTMENT_OTHER): Payer: Medicare Other

## 2017-01-27 VITALS — BP 110/57 | HR 64 | Temp 97.8°F | Resp 20

## 2017-01-27 DIAGNOSIS — D510 Vitamin B12 deficiency anemia due to intrinsic factor deficiency: Secondary | ICD-10-CM

## 2017-01-27 MED ORDER — CYANOCOBALAMIN 1000 MCG/ML IJ SOLN
1000.0000 ug | Freq: Once | INTRAMUSCULAR | Status: AC
  Administered 2017-01-27: 1000 ug via INTRAMUSCULAR

## 2017-01-27 NOTE — Patient Instructions (Signed)
Cyanocobalamin, Vitamin B12 injection What is this medicine? CYANOCOBALAMIN (sye an oh koe BAL a min) is a man made form of vitamin B12. Vitamin B12 is used in the growth of healthy blood cells, nerve cells, and proteins in the body. It also helps with the metabolism of fats and carbohydrates. This medicine is used to treat people who can not absorb vitamin B12. This medicine may be used for other purposes; ask your health care provider or pharmacist if you have questions. COMMON BRAND NAME(S): B-12 Compliance Kit, B-12 Injection Kit, Cyomin, LA-12, Nutri-Twelve, Physicians EZ Use B-12, Primabalt What should I tell my health care provider before I take this medicine? They need to know if you have any of these conditions: -kidney disease -Leber's disease -megaloblastic anemia -an unusual or allergic reaction to cyanocobalamin, cobalt, other medicines, foods, dyes, or preservatives -pregnant or trying to get pregnant -breast-feeding How should I use this medicine? This medicine is injected into a muscle or deeply under the skin. It is usually given by a health care professional in a clinic or doctor's office. However, your doctor may teach you how to inject yourself. Follow all instructions. Talk to your pediatrician regarding the use of this medicine in children. Special care may be needed. Overdosage: If you think you have taken too much of this medicine contact a poison control center or emergency room at once. NOTE: This medicine is only for you. Do not share this medicine with others. What if I miss a dose? If you are given your dose at a clinic or doctor's office, call to reschedule your appointment. If you give your own injections and you miss a dose, take it as soon as you can. If it is almost time for your next dose, take only that dose. Do not take double or extra doses. What may interact with this medicine? -colchicine -heavy alcohol intake This list may not describe all possible  interactions. Give your health care provider a list of all the medicines, herbs, non-prescription drugs, or dietary supplements you use. Also tell them if you smoke, drink alcohol, or use illegal drugs. Some items may interact with your medicine. What should I watch for while using this medicine? Visit your doctor or health care professional regularly. You may need blood work done while you are taking this medicine. You may need to follow a special diet. Talk to your doctor. Limit your alcohol intake and avoid smoking to get the best benefit. What side effects may I notice from receiving this medicine? Side effects that you should report to your doctor or health care professional as soon as possible: -allergic reactions like skin rash, itching or hives, swelling of the face, lips, or tongue -blue tint to skin -chest tightness, pain -difficulty breathing, wheezing -dizziness -red, swollen painful area on the leg Side effects that usually do not require medical attention (report to your doctor or health care professional if they continue or are bothersome): -diarrhea -headache This list may not describe all possible side effects. Call your doctor for medical advice about side effects. You may report side effects to FDA at 1-800-FDA-1088. Where should I keep my medicine? Keep out of the reach of children. Store at room temperature between 15 and 30 degrees C (59 and 85 degrees F). Protect from light. Throw away any unused medicine after the expiration date. NOTE: This sheet is a summary. It may not cover all possible information. If you have questions about this medicine, talk to your doctor, pharmacist, or   health care provider.  2018 Elsevier/Gold Standard (2007-06-22 22:10:20)  

## 2017-02-24 ENCOUNTER — Ambulatory Visit (HOSPITAL_BASED_OUTPATIENT_CLINIC_OR_DEPARTMENT_OTHER): Payer: Medicare Other

## 2017-02-24 VITALS — BP 120/71 | HR 56 | Temp 98.5°F | Resp 18

## 2017-02-24 DIAGNOSIS — D510 Vitamin B12 deficiency anemia due to intrinsic factor deficiency: Secondary | ICD-10-CM | POA: Diagnosis not present

## 2017-02-24 MED ORDER — CYANOCOBALAMIN 1000 MCG/ML IJ SOLN
1000.0000 ug | Freq: Once | INTRAMUSCULAR | Status: AC
  Administered 2017-02-24: 1000 ug via INTRAMUSCULAR

## 2017-02-24 NOTE — Patient Instructions (Signed)
Cyanocobalamin, Vitamin B12 injection What is this medicine? CYANOCOBALAMIN (sye an oh koe BAL a min) is a man made form of vitamin B12. Vitamin B12 is used in the growth of healthy blood cells, nerve cells, and proteins in the body. It also helps with the metabolism of fats and carbohydrates. This medicine is used to treat people who can not absorb vitamin B12. This medicine may be used for other purposes; ask your health care provider or pharmacist if you have questions. COMMON BRAND NAME(S): B-12 Compliance Kit, B-12 Injection Kit, Cyomin, LA-12, Nutri-Twelve, Physicians EZ Use B-12, Primabalt What should I tell my health care provider before I take this medicine? They need to know if you have any of these conditions: -kidney disease -Leber's disease -megaloblastic anemia -an unusual or allergic reaction to cyanocobalamin, cobalt, other medicines, foods, dyes, or preservatives -pregnant or trying to get pregnant -breast-feeding How should I use this medicine? This medicine is injected into a muscle or deeply under the skin. It is usually given by a health care professional in a clinic or doctor's office. However, your doctor may teach you how to inject yourself. Follow all instructions. Talk to your pediatrician regarding the use of this medicine in children. Special care may be needed. Overdosage: If you think you have taken too much of this medicine contact a poison control center or emergency room at once. NOTE: This medicine is only for you. Do not share this medicine with others. What if I miss a dose? If you are given your dose at a clinic or doctor's office, call to reschedule your appointment. If you give your own injections and you miss a dose, take it as soon as you can. If it is almost time for your next dose, take only that dose. Do not take double or extra doses. What may interact with this medicine? -colchicine -heavy alcohol intake This list may not describe all possible  interactions. Give your health care provider a list of all the medicines, herbs, non-prescription drugs, or dietary supplements you use. Also tell them if you smoke, drink alcohol, or use illegal drugs. Some items may interact with your medicine. What should I watch for while using this medicine? Visit your doctor or health care professional regularly. You may need blood work done while you are taking this medicine. You may need to follow a special diet. Talk to your doctor. Limit your alcohol intake and avoid smoking to get the best benefit. What side effects may I notice from receiving this medicine? Side effects that you should report to your doctor or health care professional as soon as possible: -allergic reactions like skin rash, itching or hives, swelling of the face, lips, or tongue -blue tint to skin -chest tightness, pain -difficulty breathing, wheezing -dizziness -red, swollen painful area on the leg Side effects that usually do not require medical attention (report to your doctor or health care professional if they continue or are bothersome): -diarrhea -headache This list may not describe all possible side effects. Call your doctor for medical advice about side effects. You may report side effects to FDA at 1-800-FDA-1088. Where should I keep my medicine? Keep out of the reach of children. Store at room temperature between 15 and 30 degrees C (59 and 85 degrees F). Protect from light. Throw away any unused medicine after the expiration date. NOTE: This sheet is a summary. It may not cover all possible information. If you have questions about this medicine, talk to your doctor, pharmacist, or   health care provider.  2018 Elsevier/Gold Standard (2007-06-22 22:10:20)  

## 2017-03-05 IMAGING — US US EXTREM LOW VENOUS*L*
1 series · 13 of 24 positions shown · non-contrast
Comparison: None.

CLINICAL DATA: Posterior left knee pain for several weeks.



[Series 1: us extrem low venous*left* · 13 of 40 slices shown]
[im 1/40]
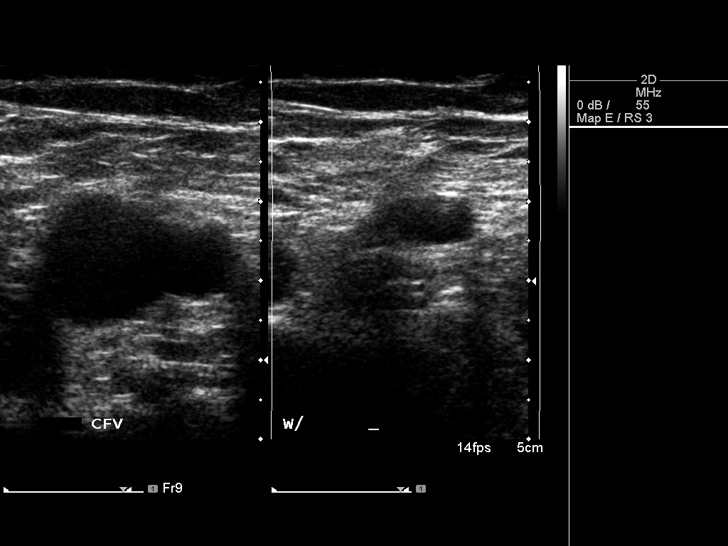
[im 4/40]
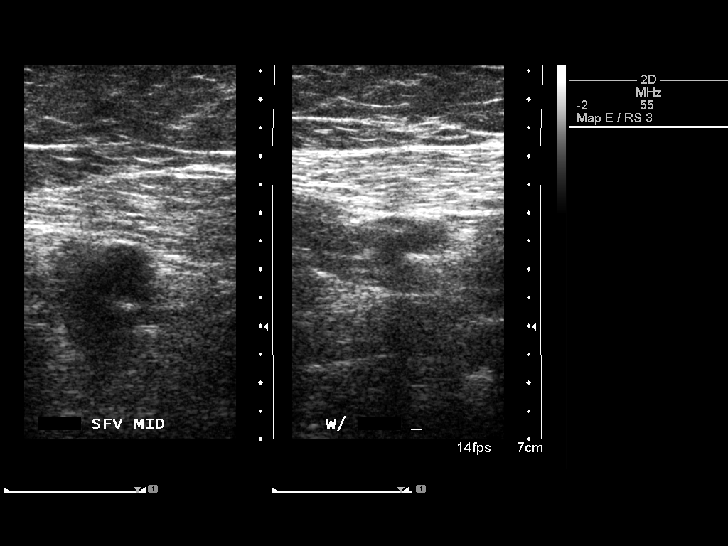
[im 7/40]
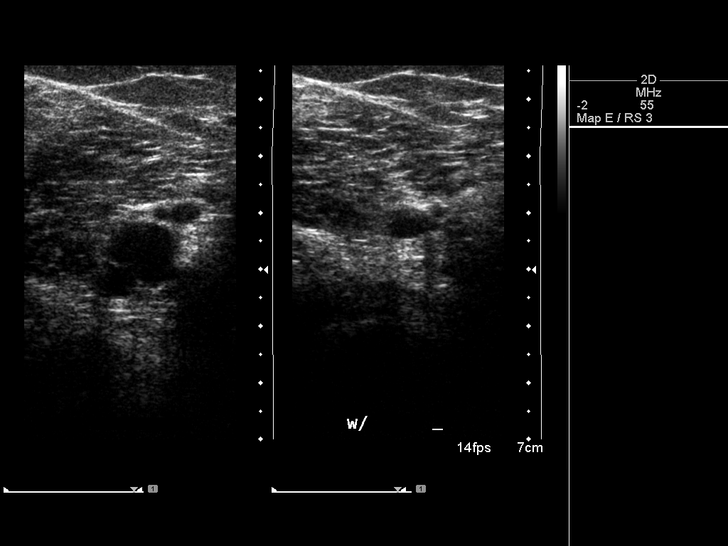
[im 11/40]
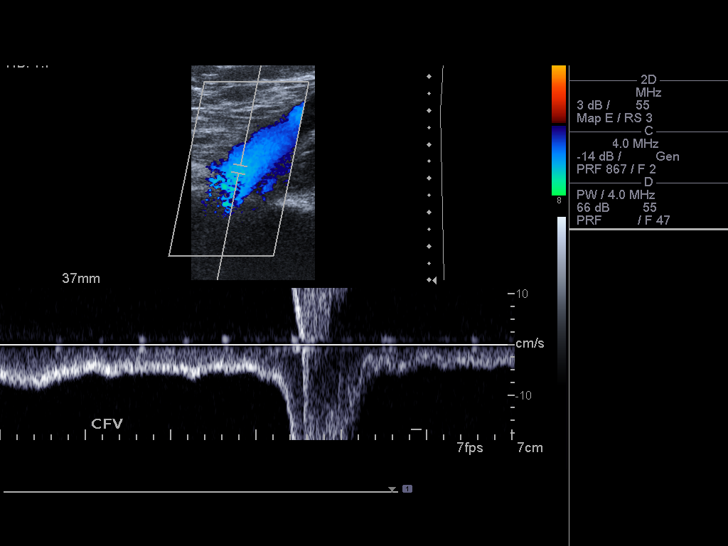
[im 14/40]
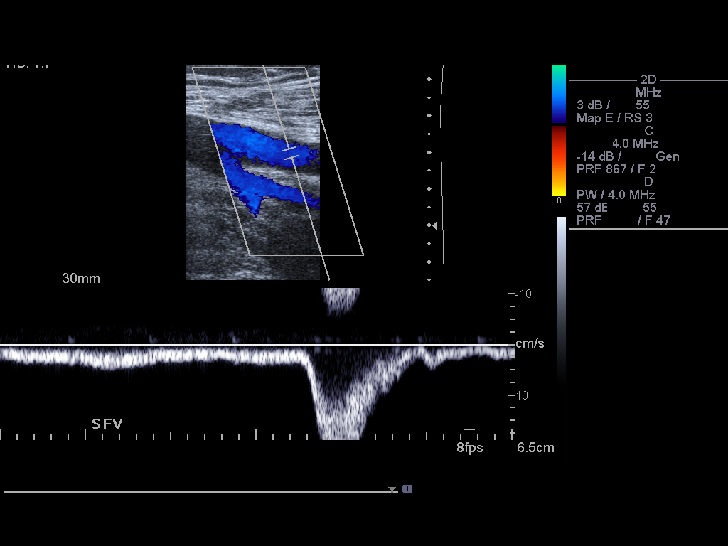
[im 17/40]
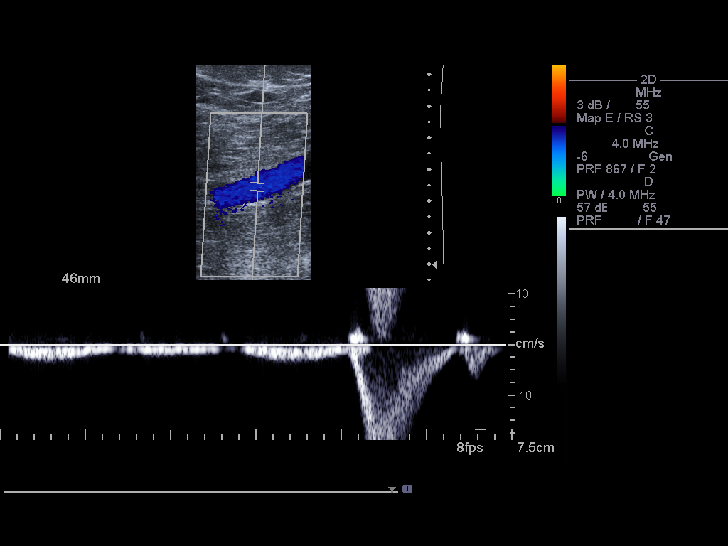
[im 21/40]
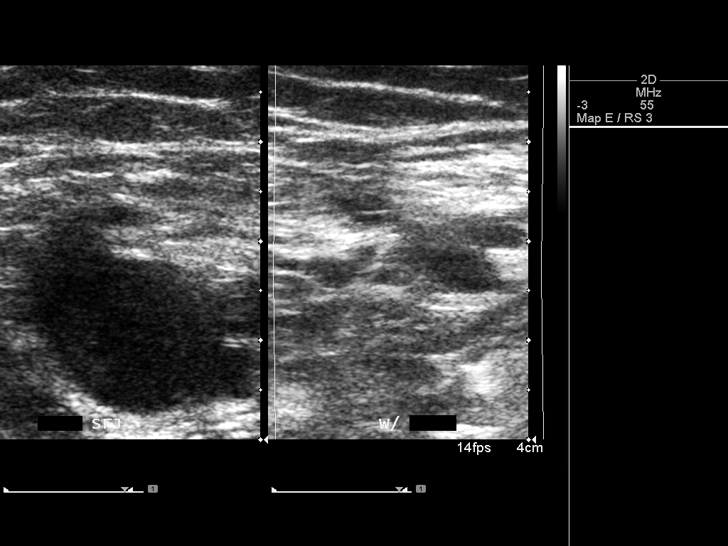
[im 23/40]
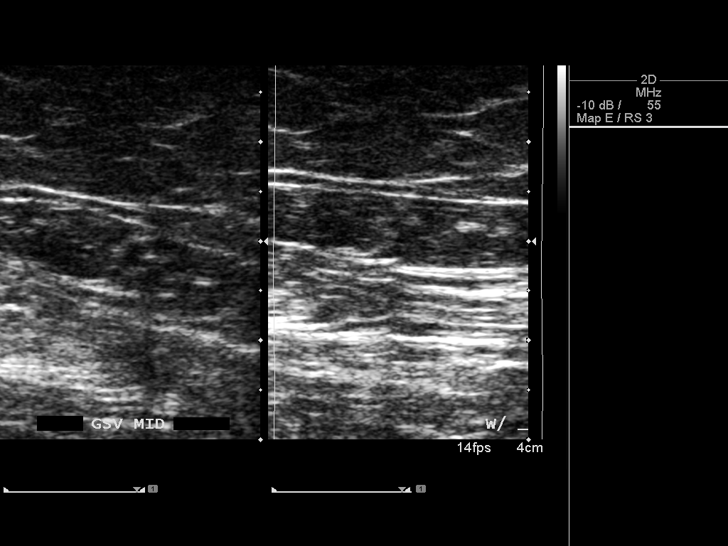
[im 26/40]
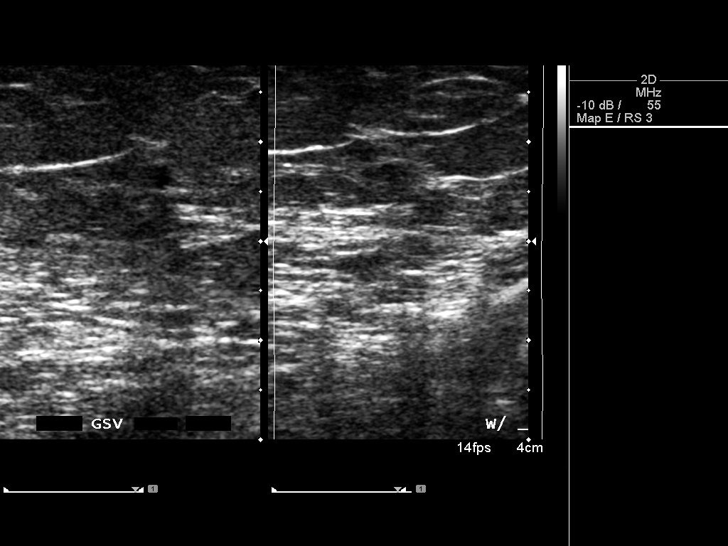
[im 29/40]
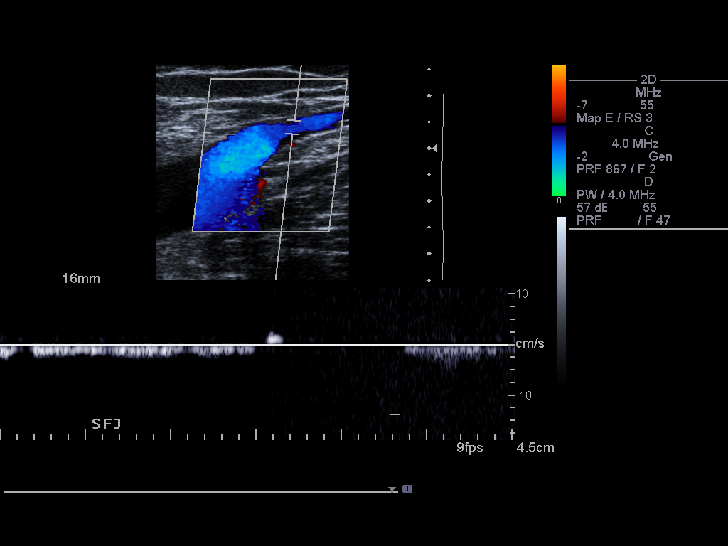
[im 33/40]
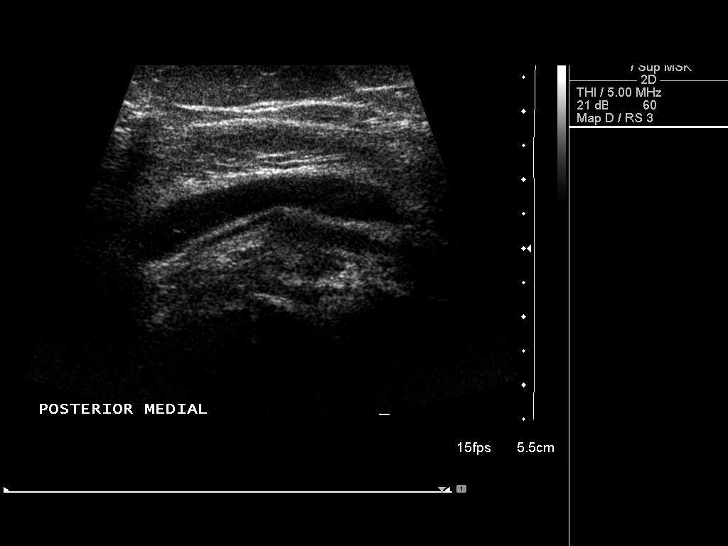
[im 36/40]
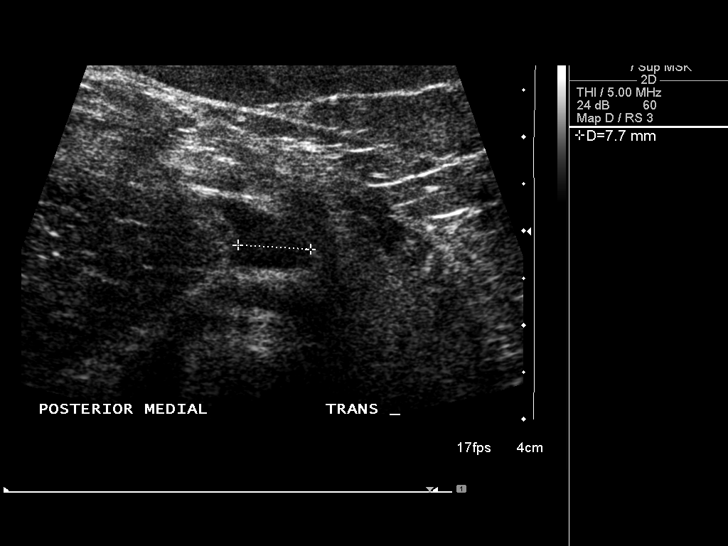
[im 40/40]
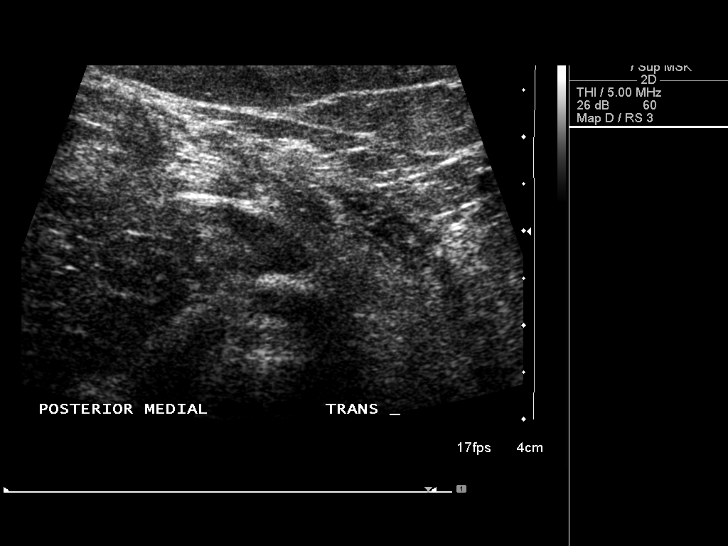

[13 of 24 positions shown; findings below may reference images not displayed]

FINDINGS: Contralateral Common Femoral Vein: Respiratory phasicity is normal
and symmetric with the symptomatic side. No evidence of thrombus.
Normal compressibility.

Common Femoral Vein: No evidence of thrombus. Normal
compressibility, respiratory phasicity and response to augmentation.

Saphenofemoral Junction: No evidence of thrombus. Normal
compressibility and flow on color Doppler imaging.

Profunda Femoral Vein: No evidence of thrombus. Normal
compressibility and flow on color Doppler imaging.

Femoral Vein: No evidence of thrombus. Normal compressibility,
respiratory phasicity and response to augmentation.

Popliteal Vein: No evidence of thrombus. Normal compressibility,
respiratory phasicity and response to augmentation.

Calf Veins: No evidence of thrombus. Normal compressibility and flow
on color Doppler imaging.

Superficial Great Saphenous Vein: No evidence of thrombus. Normal
compressibility and flow on color Doppler imaging.

Venous Reflux:  None.

Other Findings: Small elongated nondistended left popliteal fossa
Baker's cyst measures 4 x 0.7 x 0.8 cm.
IMPRESSION: Negative for left lower extremity DVT.

4 x 0.7 x 0.8 cm left popliteal fossa nondistended Bakers cyst.
Patient had a therapeutic left knee injection with steroids at the
orthopedic office yesterday. Therefore, I would recommend delaying
the Baker's cyst aspiration and injection for 6 weeks.

## 2017-04-14 ENCOUNTER — Telehealth: Payer: Self-pay | Admitting: Oncology

## 2017-04-14 NOTE — Telephone Encounter (Signed)
Called patient back after leaving a voicemail - scheduled appts and left voicemail for patient regarding appts.

## 2017-04-17 ENCOUNTER — Ambulatory Visit: Payer: Medicare Other

## 2017-05-06 ENCOUNTER — Telehealth: Payer: Self-pay | Admitting: Oncology

## 2017-05-06 NOTE — Telephone Encounter (Signed)
patient called to reschedule °

## 2017-05-08 ENCOUNTER — Ambulatory Visit: Payer: Medicare Other

## 2017-05-12 ENCOUNTER — Inpatient Hospital Stay: Payer: Medicare Other | Attending: Oncology

## 2017-05-12 VITALS — BP 132/66 | HR 68 | Temp 98.6°F | Resp 20

## 2017-05-12 DIAGNOSIS — D510 Vitamin B12 deficiency anemia due to intrinsic factor deficiency: Secondary | ICD-10-CM | POA: Insufficient documentation

## 2017-05-12 MED ORDER — CYANOCOBALAMIN 1000 MCG/ML IJ SOLN
1000.0000 ug | Freq: Once | INTRAMUSCULAR | Status: AC
  Administered 2017-05-12: 1000 ug via INTRAMUSCULAR

## 2017-05-12 NOTE — Patient Instructions (Signed)
Cyanocobalamin, Vitamin B12 injection What is this medicine? CYANOCOBALAMIN (sye an oh koe BAL a min) is a man made form of vitamin B12. Vitamin B12 is used in the growth of healthy blood cells, nerve cells, and proteins in the body. It also helps with the metabolism of fats and carbohydrates. This medicine is used to treat people who can not absorb vitamin B12. This medicine may be used for other purposes; ask your health care provider or pharmacist if you have questions. COMMON BRAND NAME(S): B-12 Compliance Kit, B-12 Injection Kit, Cyomin, LA-12, Nutri-Twelve, Physicians EZ Use B-12, Primabalt What should I tell my health care provider before I take this medicine? They need to know if you have any of these conditions: -kidney disease -Leber's disease -megaloblastic anemia -an unusual or allergic reaction to cyanocobalamin, cobalt, other medicines, foods, dyes, or preservatives -pregnant or trying to get pregnant -breast-feeding How should I use this medicine? This medicine is injected into a muscle or deeply under the skin. It is usually given by a health care professional in a clinic or doctor's office. However, your doctor may teach you how to inject yourself. Follow all instructions. Talk to your pediatrician regarding the use of this medicine in children. Special care may be needed. Overdosage: If you think you have taken too much of this medicine contact a poison control center or emergency room at once. NOTE: This medicine is only for you. Do not share this medicine with others. What if I miss a dose? If you are given your dose at a clinic or doctor's office, call to reschedule your appointment. If you give your own injections and you miss a dose, take it as soon as you can. If it is almost time for your next dose, take only that dose. Do not take double or extra doses. What may interact with this medicine? -colchicine -heavy alcohol intake This list may not describe all possible  interactions. Give your health care provider a list of all the medicines, herbs, non-prescription drugs, or dietary supplements you use. Also tell them if you smoke, drink alcohol, or use illegal drugs. Some items may interact with your medicine. What should I watch for while using this medicine? Visit your doctor or health care professional regularly. You may need blood work done while you are taking this medicine. You may need to follow a special diet. Talk to your doctor. Limit your alcohol intake and avoid smoking to get the best benefit. What side effects may I notice from receiving this medicine? Side effects that you should report to your doctor or health care professional as soon as possible: -allergic reactions like skin rash, itching or hives, swelling of the face, lips, or tongue -blue tint to skin -chest tightness, pain -difficulty breathing, wheezing -dizziness -red, swollen painful area on the leg Side effects that usually do not require medical attention (report to your doctor or health care professional if they continue or are bothersome): -diarrhea -headache This list may not describe all possible side effects. Call your doctor for medical advice about side effects. You may report side effects to FDA at 1-800-FDA-1088. Where should I keep my medicine? Keep out of the reach of children. Store at room temperature between 15 and 30 degrees C (59 and 85 degrees F). Protect from light. Throw away any unused medicine after the expiration date. NOTE: This sheet is a summary. It may not cover all possible information. If you have questions about this medicine, talk to your doctor, pharmacist, or   health care provider.  2018 Elsevier/Gold Standard (2007-06-22 22:10:20)  

## 2017-05-20 NOTE — Progress Notes (Signed)
This encounter was created in error - please disregard.

## 2017-06-02 ENCOUNTER — Telehealth: Payer: Self-pay | Admitting: Oncology

## 2017-06-02 NOTE — Telephone Encounter (Signed)
Returned phone call for patient - rescheduled inj due to patient going out of town

## 2017-06-04 ENCOUNTER — Inpatient Hospital Stay: Payer: Medicare Other | Attending: Oncology

## 2017-06-04 VITALS — BP 111/72 | HR 58 | Temp 98.3°F | Resp 18

## 2017-06-04 DIAGNOSIS — D510 Vitamin B12 deficiency anemia due to intrinsic factor deficiency: Secondary | ICD-10-CM | POA: Insufficient documentation

## 2017-06-04 MED ORDER — CYANOCOBALAMIN 1000 MCG/ML IJ SOLN
1000.0000 ug | Freq: Once | INTRAMUSCULAR | Status: AC
  Administered 2017-06-04: 1000 ug via INTRAMUSCULAR

## 2017-06-05 ENCOUNTER — Ambulatory Visit: Payer: Medicare Other

## 2017-07-03 ENCOUNTER — Inpatient Hospital Stay: Payer: Medicare Other | Attending: Oncology

## 2017-07-03 DIAGNOSIS — D510 Vitamin B12 deficiency anemia due to intrinsic factor deficiency: Secondary | ICD-10-CM | POA: Diagnosis present

## 2017-07-03 MED ORDER — CYANOCOBALAMIN 1000 MCG/ML IJ SOLN
1000.0000 ug | Freq: Once | INTRAMUSCULAR | Status: AC
  Administered 2017-07-03: 1000 ug via INTRAMUSCULAR

## 2017-07-03 NOTE — Patient Instructions (Signed)
Cyanocobalamin, Vitamin B12 injection What is this medicine? CYANOCOBALAMIN (sye an oh koe BAL a min) is a man made form of vitamin B12. Vitamin B12 is used in the growth of healthy blood cells, nerve cells, and proteins in the body. It also helps with the metabolism of fats and carbohydrates. This medicine is used to treat people who can not absorb vitamin B12. This medicine may be used for other purposes; ask your health care provider or pharmacist if you have questions. COMMON BRAND NAME(S): B-12 Compliance Kit, B-12 Injection Kit, Cyomin, LA-12, Nutri-Twelve, Physicians EZ Use B-12, Primabalt What should I tell my health care provider before I take this medicine? They need to know if you have any of these conditions: -kidney disease -Leber's disease -megaloblastic anemia -an unusual or allergic reaction to cyanocobalamin, cobalt, other medicines, foods, dyes, or preservatives -pregnant or trying to get pregnant -breast-feeding How should I use this medicine? This medicine is injected into a muscle or deeply under the skin. It is usually given by a health care professional in a clinic or doctor's office. However, your doctor may teach you how to inject yourself. Follow all instructions. Talk to your pediatrician regarding the use of this medicine in children. Special care may be needed. Overdosage: If you think you have taken too much of this medicine contact a poison control center or emergency room at once. NOTE: This medicine is only for you. Do not share this medicine with others. What if I miss a dose? If you are given your dose at a clinic or doctor's office, call to reschedule your appointment. If you give your own injections and you miss a dose, take it as soon as you can. If it is almost time for your next dose, take only that dose. Do not take double or extra doses. What may interact with this medicine? -colchicine -heavy alcohol intake This list may not describe all possible  interactions. Give your health care provider a list of all the medicines, herbs, non-prescription drugs, or dietary supplements you use. Also tell them if you smoke, drink alcohol, or use illegal drugs. Some items may interact with your medicine. What should I watch for while using this medicine? Visit your doctor or health care professional regularly. You may need blood work done while you are taking this medicine. You may need to follow a special diet. Talk to your doctor. Limit your alcohol intake and avoid smoking to get the best benefit. What side effects may I notice from receiving this medicine? Side effects that you should report to your doctor or health care professional as soon as possible: -allergic reactions like skin rash, itching or hives, swelling of the face, lips, or tongue -blue tint to skin -chest tightness, pain -difficulty breathing, wheezing -dizziness -red, swollen painful area on the leg Side effects that usually do not require medical attention (report to your doctor or health care professional if they continue or are bothersome): -diarrhea -headache This list may not describe all possible side effects. Call your doctor for medical advice about side effects. You may report side effects to FDA at 1-800-FDA-1088. Where should I keep my medicine? Keep out of the reach of children. Store at room temperature between 15 and 30 degrees C (59 and 85 degrees F). Protect from light. Throw away any unused medicine after the expiration date. NOTE: This sheet is a summary. It may not cover all possible information. If you have questions about this medicine, talk to your doctor, pharmacist, or   health care provider.  2018 Elsevier/Gold Standard (2007-06-22 22:10:20)  

## 2017-07-31 ENCOUNTER — Inpatient Hospital Stay: Payer: Medicare Other | Attending: Oncology

## 2017-07-31 VITALS — BP 111/68 | HR 64 | Temp 98.3°F | Resp 18

## 2017-07-31 DIAGNOSIS — D510 Vitamin B12 deficiency anemia due to intrinsic factor deficiency: Secondary | ICD-10-CM | POA: Diagnosis present

## 2017-07-31 MED ORDER — CYANOCOBALAMIN 1000 MCG/ML IJ SOLN
1000.0000 ug | Freq: Once | INTRAMUSCULAR | Status: AC
  Administered 2017-07-31: 1000 ug via INTRAMUSCULAR

## 2017-07-31 NOTE — Patient Instructions (Signed)
Cyanocobalamin, Vitamin B12 injection What is this medicine? CYANOCOBALAMIN (sye an oh koe BAL a min) is a man made form of vitamin B12. Vitamin B12 is used in the growth of healthy blood cells, nerve cells, and proteins in the body. It also helps with the metabolism of fats and carbohydrates. This medicine is used to treat people who can not absorb vitamin B12. This medicine may be used for other purposes; ask your health care provider or pharmacist if you have questions. COMMON BRAND NAME(S): B-12 Compliance Kit, B-12 Injection Kit, Cyomin, LA-12, Nutri-Twelve, Physicians EZ Use B-12, Primabalt What should I tell my health care provider before I take this medicine? They need to know if you have any of these conditions: -kidney disease -Leber's disease -megaloblastic anemia -an unusual or allergic reaction to cyanocobalamin, cobalt, other medicines, foods, dyes, or preservatives -pregnant or trying to get pregnant -breast-feeding How should I use this medicine? This medicine is injected into a muscle or deeply under the skin. It is usually given by a health care professional in a clinic or doctor's office. However, your doctor may teach you how to inject yourself. Follow all instructions. Talk to your pediatrician regarding the use of this medicine in children. Special care may be needed. Overdosage: If you think you have taken too much of this medicine contact a poison control center or emergency room at once. NOTE: This medicine is only for you. Do not share this medicine with others. What if I miss a dose? If you are given your dose at a clinic or doctor's office, call to reschedule your appointment. If you give your own injections and you miss a dose, take it as soon as you can. If it is almost time for your next dose, take only that dose. Do not take double or extra doses. What may interact with this medicine? -colchicine -heavy alcohol intake This list may not describe all possible  interactions. Give your health care provider a list of all the medicines, herbs, non-prescription drugs, or dietary supplements you use. Also tell them if you smoke, drink alcohol, or use illegal drugs. Some items may interact with your medicine. What should I watch for while using this medicine? Visit your doctor or health care professional regularly. You may need blood work done while you are taking this medicine. You may need to follow a special diet. Talk to your doctor. Limit your alcohol intake and avoid smoking to get the best benefit. What side effects may I notice from receiving this medicine? Side effects that you should report to your doctor or health care professional as soon as possible: -allergic reactions like skin rash, itching or hives, swelling of the face, lips, or tongue -blue tint to skin -chest tightness, pain -difficulty breathing, wheezing -dizziness -red, swollen painful area on the leg Side effects that usually do not require medical attention (report to your doctor or health care professional if they continue or are bothersome): -diarrhea -headache This list may not describe all possible side effects. Call your doctor for medical advice about side effects. You may report side effects to FDA at 1-800-FDA-1088. Where should I keep my medicine? Keep out of the reach of children. Store at room temperature between 15 and 30 degrees C (59 and 85 degrees F). Protect from light. Throw away any unused medicine after the expiration date. NOTE: This sheet is a summary. It may not cover all possible information. If you have questions about this medicine, talk to your doctor, pharmacist, or   health care provider.  2018 Elsevier/Gold Standard (2007-06-22 22:10:20)  

## 2017-08-28 ENCOUNTER — Inpatient Hospital Stay: Payer: Medicare Other | Attending: Oncology

## 2017-08-28 VITALS — BP 131/68 | HR 72 | Temp 98.2°F | Resp 16

## 2017-08-28 DIAGNOSIS — D510 Vitamin B12 deficiency anemia due to intrinsic factor deficiency: Secondary | ICD-10-CM | POA: Insufficient documentation

## 2017-08-28 MED ORDER — CYANOCOBALAMIN 1000 MCG/ML IJ SOLN
1000.0000 ug | Freq: Once | INTRAMUSCULAR | Status: AC
  Administered 2017-08-28: 1000 ug via INTRAMUSCULAR

## 2017-09-26 ENCOUNTER — Inpatient Hospital Stay: Payer: Medicare Other | Attending: Oncology

## 2017-10-28 ENCOUNTER — Other Ambulatory Visit (HOSPITAL_COMMUNITY)
Admission: RE | Admit: 2017-10-28 | Discharge: 2017-10-28 | Disposition: A | Discharge status: Home / Self Care | Payer: Medicare Other | Source: Ambulatory Visit | Attending: Certified Nurse Midwife | Admitting: Certified Nurse Midwife

## 2017-10-28 ENCOUNTER — Other Ambulatory Visit: Payer: Self-pay

## 2017-10-28 ENCOUNTER — Ambulatory Visit: Payer: Medicare Other | Admitting: Certified Nurse Midwife

## 2017-10-28 ENCOUNTER — Encounter: Payer: Self-pay | Admitting: Certified Nurse Midwife

## 2017-10-28 VITALS — BP 124/68 | HR 66 | Resp 14 | Ht 65.0 in | Wt 190.0 lb

## 2017-10-28 DIAGNOSIS — Z124 Encounter for screening for malignant neoplasm of cervix: Secondary | ICD-10-CM

## 2017-10-28 DIAGNOSIS — N952 Postmenopausal atrophic vaginitis: Secondary | ICD-10-CM

## 2017-10-28 DIAGNOSIS — Z862 Personal history of diseases of the blood and blood-forming organs and certain disorders involving the immune mechanism: Secondary | ICD-10-CM

## 2017-10-28 DIAGNOSIS — Z01419 Encounter for gynecological examination (general) (routine) without abnormal findings: Secondary | ICD-10-CM

## 2017-10-28 NOTE — Progress Notes (Signed)
67 y.o. [id number] Divorced  African American Fe here for annual exam. Denies vaginal bleeding or vaginal dryness. Sees Dr. Arlice ColtMagrinot for pernicious anemia and all labs.  Sees Dr Posey ReaPlotnikov for aex. No health issues in the past year. Mammogram due soon, patient will schedule. Using Premarin vaginal cream twice weekly for vaginal dryness with good results.   Patient's last menstrual period was 03/25/2008.          Sexually active: No.  The current method of family planning is post menopausal status.    Exercising: Yes.    stationary bike, walking, line dancing  Smoker:  no  Review of Systems  Constitutional: Negative.   HENT: Negative.   Eyes: Negative.   Respiratory: Negative.   Cardiovascular: Negative.   Gastrointestinal: Negative.   Genitourinary: Negative.   Musculoskeletal: Negative.   Skin: Negative.   Neurological: Negative.   Endo/Heme/Allergies: Negative.   Psychiatric/Behavioral: Negative.     Health Maintenance: Pap:  10-08-15 neg History of Abnormal Pap: no MMG:  10-31-16 category b density birads 1:neg Self Breast exams: yes Colonoscopy:  08/2017 with Dr. Matthias HughsBuccini, repeat in 3 years per patient  BMD:   2018 TDaP:  2015 Shingles: 2019 Pneumonia: 2019 Hep C and HIV: HIV done for insurance Labs:  Does with Dr. Darnelle CatalanMagrinat    reports that she has never smoked. She has never used smokeless tobacco. She reports that she drinks alcohol. She reports that she does not use drugs.  Past Medical History:  Diagnosis Date  . Anemia   . Vitamin B 12 deficiency     Past Surgical History:  Procedure Laterality Date  . TONSILLECTOMY AND ADENOIDECTOMY      Current Outpatient Medications  Medication Sig Dispense Refill  . acetaminophen (TYLENOL) 325 MG tablet Take 650 mg by mouth as needed.    Marland Kitchen. BAYER ASPIRIN PO Take 81 mg by mouth daily.    . Cholecalciferol (VITAMIN D PO) Take 5,000 Int'l Units by mouth daily.    . cyanocobalamin (COBAL-1000) 1000 MCG/ML injection Inject 1 mL  (1,000 mcg total) into the muscle every 30 (thirty) days. 10 mL 6  . Homeopathic Products (LEG CRAMP RELIEF PO) Take by mouth.    Marland Kitchen. ibuprofen (ADVIL,MOTRIN) 200 MG tablet Take 200 mg by mouth every 6 (six) hours as needed.    . meloxicam (MOBIC) 15 MG tablet TAKE 1 TABLET EVERY DAY AS NEEDED WITH FOOD FOR PAIN/INFLAMMATION  1  . naproxen sodium (ALEVE) 220 MG tablet Take 220 mg by mouth as needed.    Marland Kitchen. OVER THE COUNTER MEDICATION Curamin Capsules    . PREMARIN vaginal cream INSERT 1/2 GRAM VAGINALLY TWICE PER WEEK. 30 g 0  . vitamin C (ASCORBIC ACID) 500 MG tablet Take 500 mg by mouth daily.     No current facility-administered medications for this visit.     Family History  Problem Relation Age of Onset  . Hypertension Mother   . Cancer Father        lung ca  . Hypertension Father   . Diabetes Father   . Thyroid disease Father   . Breast cancer Sister [phone]  . Breast cancer Paternal Aunt [phone]  . Cancer Brother 64       col ca    ROS:  Pertinent items are noted in HPI.  Otherwise, a comprehensive ROS was negative.  Exam:   BP 124/68 (BP Location: Right Arm, Patient Position: Sitting, Cuff Size: Normal)   Pulse 66   Resp 14  Ht 5\' 5"  (1.651 m)   Wt 190 lb (86.2 kg)   LMP 03/25/2008   BMI 31.62 kg/m  Height: 5\' 5"  (165.1 cm) Ht Readings from Last 3 Encounters:  10/28/17 5\' 5"  (1.651 m)  11/04/16 5\' 5"  (1.651 m)  10/24/16 5\' 5"  (1.651 m)    General appearance: alert, cooperative and appears stated age Head: Normocephalic, without obvious abnormality, atraumatic Neck: no adenopathy, supple, symmetrical, trachea midline and thyroid normal to inspection and palpation Lungs: clear to auscultation bilaterally Breasts: normal appearance, no masses or tenderness, No nipple retraction or dimpling, No nipple discharge or bleeding, No axillary or supraclavicular adenopathy Heart: regular rate and rhythm Abdomen: soft, non-tender; no masses,  no organomegaly Extremities: extremities  normal, atraumatic, no cyanosis or edema Skin: Skin color, texture, turgor normal. No rashes or lesions Lymph nodes: Cervical, supraclavicular, and axillary nodes normal. No abnormal inguinal nodes palpated Neurologic: Grossly normal   Pelvic: External genitalia:  no lesions              Urethra:  normal appearing urethra with no masses, tenderness or lesions              Bartholin's and Skene's: normal                 Vagina: normal appearing vagina with normal color and discharge, no lesions              Cervix: no cervical motion tenderness, no lesions and normal appearance              Pap taken: Yes.   Bimanual Exam:  Uterus:  normal size, contour, position, consistency, mobility, non-tender and mid position              Adnexa: normal adnexa and no mass, fullness, tenderness               Rectovaginal: Confirms               Anus:  normal sphincter tone, no lesions  Chaperone present: yes  A:  Well Woman with normal exam  Menopausal no HRT  Pernicious anemia with hematology management  Atrophic vaginitis with Premarin vaginal cream use working well.  Family history of breast cancer sister(58) maternal aunt (68)  P:   Reviewed health and wellness pertinent to exam  Aware of need to advise if vaginal bleeding  Continue follow up as indicated with MD  Discussed risks/benefits/and expectations, request continuance.  Rx Premarin see order with instructions  Stressed importance SBE and mammogram yearly  Pap smear: yes   counseled on breast self exam, mammography screening, feminine hygiene, adequate intake of calcium and vitamin D, diet and exercise, Kegel's exercises  return annually or prn  An After Visit Summary was printed and given to the patient.

## 2017-10-28 NOTE — Patient Instructions (Signed)

## 2017-10-30 ENCOUNTER — Other Ambulatory Visit: Payer: Self-pay | Admitting: Certified Nurse Midwife

## 2017-10-30 DIAGNOSIS — N952 Postmenopausal atrophic vaginitis: Secondary | ICD-10-CM

## 2017-10-30 LAB — CYTOLOGY - PAP
DIAGNOSIS: NEGATIVE
HPV (WINDOPATH): NOT DETECTED

## 2017-10-30 MED ORDER — ESTROGENS, CONJUGATED 0.625 MG/GM VA CREA: TOPICAL_CREAM | VAGINAL | 3 refills | Status: DC

## 2017-11-03 NOTE — Progress Notes (Signed)
ID: Kristen LarsenEunice V Nielsen   DOB: January 18, 1951  MR#: [id number]  CSN#:[id number]  PCP: Tresa GarterPlotnikov, Aleksei V, MD GYN: Beather Arbourharles Lomax SU:  OTHER MD:   HISTORY OF PRESENT ILLNESS: From the original summary note:  The patient was diagnosed with pernicious anemia in 1996. She started to receive her B12 shots here since she did not have a primary care physician. She has established herself in Dr. Loren RacerPlotnikov's office, but still gets her injection here at her request  INTERVAL HISTORY: Kristen Nielsen returns today for follow-up and treatment of her pernicious anemia. She receives vitamin b12 supplementation monthly without any side effects or complications. She missed a Vitamin B injection in July 2019 due to being out of town.    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  [facility] [place] [facility] [place] not make it.  She went to [place] last month. She has some trouble with remembering names.  She denies unusual headaches, visual changes, nausea, vomiting, or dizziness. There has been no unusual cough, phlegm production, or pleurisy. There has been no change in bowel or bladder habits. She denies unexplained fatigue or unexplained weight loss, bleeding, rash, or fever. A detailed review of systems was otherwise stable.     PAST MEDICAL HISTORY: Past Medical History:  Diagnosis Date  . Anemia   . Vitamin B 12 deficiency     PAST SURGICAL HISTORY: Past Surgical History:  Procedure Laterality Date  . TONSILLECTOMY AND ADENOIDECTOMY      FAMILY HISTORY As of July of 2014, her parents are still alive. Her father has a history of prostate cancer. The patient has one sister, who was diagnosed with breast cancer the age of 66. She is now doing well. She has one brother who died from colon cancer in 2017.  GYNECOLOGIC HISTORY: GX P2, first live birth age 67. Change of life age 67.   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 She is divorced and lives by herself. Her daughter lives in [place], [place], where the patient's son-in-law works as [employer] managerfootball coach; the patient's son lives here in town. Kristen Nielsen has 4 grandchildren and attends a [employer].   ADVANCED DIRECTIVES: Not in place  HEALTH MAINTENANCE: Social History   Tobacco Use  . Smoking status: Never Smoker  . Smokeless tobacco: Never Used  Substance Use Topics  . Alcohol use: Yes    Alcohol/week: 0.0 - 2.0 standard drinks  . Drug use: No     Colonoscopy: Buccini  PAP: Lomax  Bone density: 2007/ normal  Lipid panel: Lomax  No Known Allergies  Current Outpatient Medications  Medication Sig Dispense Refill  . acetaminophen (TYLENOL) 325 MG tablet Take 650 mg by mouth as needed.    Marland Kitchen. BAYER ASPIRIN PO Take 81 mg by mouth daily.    . Cholecalciferol (VITAMIN D PO) Take 5,000 Int'l Units by mouth daily.    Marland Kitchen. conjugated estrogens (PREMARIN) vaginal cream 1/2 gm vaginally twice weekly 30 g 3  . cyanocobalamin (COBAL-1000) 1000 MCG/ML injection Inject 1 mL (1,000 mcg total) into the muscle every 30 (thirty) days. 10 mL 6  . Homeopathic Products (LEG CRAMP RELIEF PO) Take by mouth.    Marland Kitchen. ibuprofen (ADVIL,MOTRIN) 200 MG tablet Take 200 mg by mouth every 6 (six) hours as needed.    . meloxicam (MOBIC) 15 MG tablet TAKE 1 TABLET EVERY DAY AS  NEEDED WITH FOOD FOR PAIN/INFLAMMATION  1  . naproxen sodium (ALEVE) 220 MG tablet Take 220 mg by mouth as needed.    Marland Kitchen. OVER THE COUNTER MEDICATION Curamin Capsules    . vitamin C (ASCORBIC ACID) 500 MG tablet Take 500 mg by mouth daily.     No current facility-administered medications for this visit.     OBJECTIVE: Middle-aged [place] woman appears well  Vitals:   11/04/17 1308  BP: 126/69  Pulse: (!) 51  Resp: 18  Temp: 98.4 F (36.9 C)  SpO2: 100%     Body mass index is 31.87 kg/m.    ECOG FS: 0  Sclerae unicteric, pupils round and equal No cervical or  supraclavicular adenopathy Lungs no rales or rhonchi Heart regular rate and rhythm Abd soft, nontender, positive bowel sounds MSK no focal spinal tenderness, no upper extremity lymphedema Neuro: nonfocal, well oriented, appropriate affect Breasts: Deferred   LAB RESULTS: Lab Results  Component Value Date   WBC 8.3 11/04/2017   NEUTROABS 5.9 11/04/2017   HGB 12.1 11/04/2017   HCT 37.6 11/04/2017   MCV 82.5 11/04/2017   PLT 240 11/04/2017      Chemistry      Component Value Date/Time   NA 142 10/03/2014 1046      Component Value Date/Time   CALCIUM 9.9 10/03/2014 1046       No results found for: LABCA2  No components found for: [id number]  No results for input(s): INR in the last 168 hours.  Urinalysis    Component Value Date/Time   COLORURINE YELLOW 04/21/2013 0846    STUDIES: She is a little behind on her mammography  ASSESSMENT: 67 y.o. Kristen Nielsen woman with history of pernicious anemia diagnosed in 1996 with a positive intrinsic factor for blocking antibody.  Receives B12 injections on a monthly basis, due again today.   PLAN:  Kristen Nielsen is tolerating her B12 shots without any complications and she has not excellent hemoglobin.  At this Nielsen I think it is perfectly fine for her to receive the shots every other month.  That is the way we have scheduled them.  She wanted to know which medication she could discontinue.  I certainly think she could stop the vitamin C.  She has already stopped the Mobic.  He would like to discontinue the baby aspirin.  She has no cardiac history or significant cardiac risks factors that I can ascertain so I certainly am not uncomfortable with that decision  She will see me again in 1 year.  She knows to call for any other issues that may develop before then.    Kasee Hantz, Valentino HueGustav C, MD  11/04/17 1:25 PM Medical Oncology and Hematology [facility] [address] [place], [place] [address] Tel. [phone]    Fax.  [phone]  Fonnie BirkenheadI, Arielle Pollard, am acting as scribe for Lowella DellGustav C Deara Bober MD.  I, Ruthann CancerGustav Emmanuel Ercole MD, have reviewed the above documentation for accuracy and completeness, and I agree with the above.

## 2017-11-04 ENCOUNTER — Inpatient Hospital Stay: Payer: Medicare Other

## 2017-11-04 ENCOUNTER — Telehealth: Payer: Self-pay | Admitting: Oncology

## 2017-11-04 ENCOUNTER — Inpatient Hospital Stay: Payer: Medicare Other | Attending: Oncology | Admitting: Oncology

## 2017-11-04 VITALS — BP 126/69 | HR 51 | Temp 98.4°F | Resp 18 | Ht 65.0 in | Wt 191.5 lb

## 2017-11-04 DIAGNOSIS — D510 Vitamin B12 deficiency anemia due to intrinsic factor deficiency: Secondary | ICD-10-CM

## 2017-11-04 LAB — CBC WITH DIFFERENTIAL/PLATELET
BASOS ABS: 0 10*3/uL (ref 0.0–0.1)
Basophils Relative: 0 %
EOS ABS: 0.3 10*3/uL (ref 0.0–0.5)
EOS PCT: 3 %
HCT: 37.6 % (ref 34.8–46.6)
HEMOGLOBIN: 12.1 g/dL (ref 11.6–15.9)
LYMPHS ABS: 1.3 10*3/uL (ref 0.9–3.3)
Lymphocytes Relative: 16 %
MCH: 26.6 pg (ref 25.1–34.0)
MCHC: 32.3 g/dL (ref 31.5–36.0)
MCV: 82.5 fL (ref 79.5–101.0)
Monocytes Absolute: 0.8 10*3/uL (ref 0.1–0.9)
Monocytes Relative: 10 %
NEUTROS ABS: 5.9 10*3/uL (ref 1.5–6.5)
Neutrophils Relative %: 71 %
PLATELETS: 240 10*3/uL (ref 145–400)
RBC: 4.56 MIL/uL (ref 3.70–5.45)
RDW: 13.7 % (ref 11.2–14.5)
WBC: 8.3 10*3/uL (ref 3.9–10.3)

## 2017-11-04 LAB — TSH: TSH: 0.596 u[IU]/mL (ref 0.308–3.960)

## 2017-11-04 MED ORDER — CYANOCOBALAMIN 1000 MCG/ML IJ SOLN
1000.0000 ug | Freq: Once | INTRAMUSCULAR | Status: AC
  Administered 2017-11-04: 1000 ug via INTRAMUSCULAR

## 2017-11-04 NOTE — Patient Instructions (Signed)
Cyanocobalamin, Vitamin B12 injection What is this medicine? CYANOCOBALAMIN (sye an oh koe BAL a min) is a man made form of vitamin B12. Vitamin B12 is used in the growth of healthy blood cells, nerve cells, and proteins in the body. It also helps with the metabolism of fats and carbohydrates. This medicine is used to treat people who can not absorb vitamin B12. This medicine may be used for other purposes; ask your health care provider or pharmacist if you have questions. COMMON BRAND NAME(S): B-12 Compliance Kit, B-12 Injection Kit, Cyomin, LA-12, Nutri-Twelve, Physicians EZ Use B-12, Primabalt What should I tell my health care provider before I take this medicine? They need to know if you have any of these conditions: -kidney disease -Leber's disease -megaloblastic anemia -an unusual or allergic reaction to cyanocobalamin, cobalt, other medicines, foods, dyes, or preservatives -pregnant or trying to get pregnant -breast-feeding How should I use this medicine? This medicine is injected into a muscle or deeply under the skin. It is usually given by a health care professional in a clinic or doctor's office. However, your doctor may teach you how to inject yourself. Follow all instructions. Talk to your pediatrician regarding the use of this medicine in children. Special care may be needed. Overdosage: If you think you have taken too much of this medicine contact a poison control center or emergency room at once. NOTE: This medicine is only for you. Do not share this medicine with others. What if I miss a dose? If you are given your dose at a clinic or doctor's office, call to reschedule your appointment. If you give your own injections and you miss a dose, take it as soon as you can. If it is almost time for your next dose, take only that dose. Do not take double or extra doses. What may interact with this medicine? -colchicine -heavy alcohol intake This list may not describe all possible  interactions. Give your health care provider a list of all the medicines, herbs, non-prescription drugs, or dietary supplements you use. Also tell them if you smoke, drink alcohol, or use illegal drugs. Some items may interact with your medicine. What should I watch for while using this medicine? Visit your doctor or health care professional regularly. You may need blood work done while you are taking this medicine. You may need to follow a special diet. Talk to your doctor. Limit your alcohol intake and avoid smoking to get the best benefit. What side effects may I notice from receiving this medicine? Side effects that you should report to your doctor or health care professional as soon as possible: -allergic reactions like skin rash, itching or hives, swelling of the face, lips, or tongue -blue tint to skin -chest tightness, pain -difficulty breathing, wheezing -dizziness -red, swollen painful area on the leg Side effects that usually do not require medical attention (report to your doctor or health care professional if they continue or are bothersome): -diarrhea -headache This list may not describe all possible side effects. Call your doctor for medical advice about side effects. You may report side effects to FDA at 1-800-FDA-1088. Where should I keep my medicine? Keep out of the reach of children. Store at room temperature between 15 and 30 degrees C (59 and 85 degrees F). Protect from light. Throw away any unused medicine after the expiration date. NOTE: This sheet is a summary. It may not cover all possible information. If you have questions about this medicine, talk to your doctor, pharmacist, or   health care provider.  2018 Elsevier/Gold Standard (2007-06-22 22:10:20)  

## 2017-11-04 NOTE — Telephone Encounter (Signed)
Gave patient avs report and appointments for October 2019 thru September 2020. Annual f/u coordinated w/injection due every other month. Patient will be contacted re mammo and [facility].

## 2017-12-04 ENCOUNTER — Ambulatory Visit
Admission: RE | Admit: 2017-12-04 | Discharge: 2017-12-04 | Disposition: A | Discharge status: Home / Self Care | Payer: Medicare Other | Source: Ambulatory Visit | Attending: Oncology | Admitting: Oncology

## 2017-12-04 DIAGNOSIS — D510 Vitamin B12 deficiency anemia due to intrinsic factor deficiency: Secondary | ICD-10-CM

## 2017-12-30 ENCOUNTER — Inpatient Hospital Stay: Payer: Medicare Other | Attending: Oncology

## 2017-12-30 VITALS — BP 120/65 | HR 62 | Temp 98.7°F | Resp 18

## 2017-12-30 DIAGNOSIS — D510 Vitamin B12 deficiency anemia due to intrinsic factor deficiency: Secondary | ICD-10-CM | POA: Insufficient documentation

## 2017-12-30 MED ORDER — CYANOCOBALAMIN 1000 MCG/ML IJ SOLN
1000.0000 ug | Freq: Once | INTRAMUSCULAR | Status: AC
  Administered 2017-12-30: 1000 ug via INTRAMUSCULAR

## 2017-12-30 MED ORDER — CYANOCOBALAMIN 1000 MCG/ML IJ SOLN
INTRAMUSCULAR | Status: AC
  Filled 2017-12-30: qty 1

## 2017-12-30 NOTE — Patient Instructions (Signed)
Cyanocobalamin, Vitamin B12 injection What is this medicine? CYANOCOBALAMIN (sye an oh koe BAL a min) is a man made form of vitamin B12. Vitamin B12 is used in the growth of healthy blood cells, nerve cells, and proteins in the body. It also helps with the metabolism of fats and carbohydrates. This medicine is used to treat people who can not absorb vitamin B12. This medicine may be used for other purposes; ask your health care provider or pharmacist if you have questions. COMMON BRAND NAME(S): B-12 Compliance Kit, B-12 Injection Kit, Cyomin, LA-12, Nutri-Twelve, Physicians EZ Use B-12, Primabalt What should I tell my health care provider before I take this medicine? They need to know if you have any of these conditions: -kidney disease -Leber's disease -megaloblastic anemia -an unusual or allergic reaction to cyanocobalamin, cobalt, other medicines, foods, dyes, or preservatives -pregnant or trying to get pregnant -breast-feeding How should I use this medicine? This medicine is injected into a muscle or deeply under the skin. It is usually given by a health care professional in a clinic or doctor's office. However, your doctor may teach you how to inject yourself. Follow all instructions. Talk to your pediatrician regarding the use of this medicine in children. Special care may be needed. Overdosage: If you think you have taken too much of this medicine contact a poison control center or emergency room at once. NOTE: This medicine is only for you. Do not share this medicine with others. What if I miss a dose? If you are given your dose at a clinic or doctor's office, call to reschedule your appointment. If you give your own injections and you miss a dose, take it as soon as you can. If it is almost time for your next dose, take only that dose. Do not take double or extra doses. What may interact with this medicine? -colchicine -heavy alcohol intake This list may not describe all possible  interactions. Give your health care provider a list of all the medicines, herbs, non-prescription drugs, or dietary supplements you use. Also tell them if you smoke, drink alcohol, or use illegal drugs. Some items may interact with your medicine. What should I watch for while using this medicine? Visit your doctor or health care professional regularly. You may need blood work done while you are taking this medicine. You may need to follow a special diet. Talk to your doctor. Limit your alcohol intake and avoid smoking to get the best benefit. What side effects may I notice from receiving this medicine? Side effects that you should report to your doctor or health care professional as soon as possible: -allergic reactions like skin rash, itching or hives, swelling of the face, lips, or tongue -blue tint to skin -chest tightness, pain -difficulty breathing, wheezing -dizziness -red, swollen painful area on the leg Side effects that usually do not require medical attention (report to your doctor or health care professional if they continue or are bothersome): -diarrhea -headache This list may not describe all possible side effects. Call your doctor for medical advice about side effects. You may report side effects to FDA at 1-800-FDA-1088. Where should I keep my medicine? Keep out of the reach of children. Store at room temperature between 15 and 30 degrees C (59 and 85 degrees F). Protect from light. Throw away any unused medicine after the expiration date. NOTE: This sheet is a summary. It may not cover all possible information. If you have questions about this medicine, talk to your doctor, pharmacist, or   health care provider.  2018 Elsevier/Gold Standard (2007-06-22 22:10:20)  

## 2018-02-24 ENCOUNTER — Inpatient Hospital Stay: Payer: Medicare Other | Attending: Oncology

## 2018-02-24 VITALS — BP 127/78 | HR 51 | Temp 97.0°F | Resp 18

## 2018-02-24 DIAGNOSIS — D510 Vitamin B12 deficiency anemia due to intrinsic factor deficiency: Secondary | ICD-10-CM | POA: Insufficient documentation

## 2018-02-24 MED ORDER — CYANOCOBALAMIN 1000 MCG/ML IJ SOLN
1000.0000 ug | Freq: Once | INTRAMUSCULAR | Status: AC
  Administered 2018-02-24: 1000 ug via INTRAMUSCULAR

## 2018-02-24 MED ORDER — CYANOCOBALAMIN 1000 MCG/ML IJ SOLN
INTRAMUSCULAR | Status: AC
  Filled 2018-02-24: qty 1

## 2018-02-24 NOTE — Patient Instructions (Signed)

## 2018-04-21 ENCOUNTER — Inpatient Hospital Stay: Payer: Medicare Other | Attending: Oncology

## 2018-04-21 VITALS — BP 132/69 | HR 65 | Temp 98.8°F | Resp 18

## 2018-04-21 DIAGNOSIS — D510 Vitamin B12 deficiency anemia due to intrinsic factor deficiency: Secondary | ICD-10-CM

## 2018-04-21 MED ORDER — CYANOCOBALAMIN 1000 MCG/ML IJ SOLN
INTRAMUSCULAR | Status: AC
  Filled 2018-04-21: qty 1

## 2018-04-21 MED ORDER — CYANOCOBALAMIN 1000 MCG/ML IJ SOLN
1000.0000 ug | Freq: Once | INTRAMUSCULAR | Status: AC
  Administered 2018-04-21: 1000 ug via INTRAMUSCULAR

## 2018-04-21 NOTE — Patient Instructions (Signed)
Cyanocobalamin, Vitamin B12 injection What is this medicine? CYANOCOBALAMIN (sye an oh koe BAL a min) is a man made form of vitamin B12. Vitamin B12 is used in the growth of healthy blood cells, nerve cells, and proteins in the body. It also helps with the metabolism of fats and carbohydrates. This medicine is used to treat people who can not absorb vitamin B12. This medicine may be used for other purposes; ask your health care provider or pharmacist if you have questions. COMMON BRAND NAME(S): B-12 Compliance Kit, B-12 Injection Kit, Cyomin, LA-12, Nutri-Twelve, Physicians EZ Use B-12, Primabalt What should I tell my health care provider before I take this medicine? They need to know if you have any of these conditions: -kidney disease -Leber's disease -megaloblastic anemia -an unusual or allergic reaction to cyanocobalamin, cobalt, other medicines, foods, dyes, or preservatives -pregnant or trying to get pregnant -breast-feeding How should I use this medicine? This medicine is injected into a muscle or deeply under the skin. It is usually given by a health care professional in a clinic or doctor's office. However, your doctor may teach you how to inject yourself. Follow all instructions. Talk to your pediatrician regarding the use of this medicine in children. Special care may be needed. Overdosage: If you think you have taken too much of this medicine contact a poison control center or emergency room at once. NOTE: This medicine is only for you. Do not share this medicine with others. What if I miss a dose? If you are given your dose at a clinic or doctor's office, call to reschedule your appointment. If you give your own injections and you miss a dose, take it as soon as you can. If it is almost time for your next dose, take only that dose. Do not take double or extra doses. What may interact with this medicine? -colchicine -heavy alcohol intake This list may not describe all possible  interactions. Give your health care provider a list of all the medicines, herbs, non-prescription drugs, or dietary supplements you use. Also tell them if you smoke, drink alcohol, or use illegal drugs. Some items may interact with your medicine. What should I watch for while using this medicine? Visit your doctor or health care professional regularly. You may need blood work done while you are taking this medicine. You may need to follow a special diet. Talk to your doctor. Limit your alcohol intake and avoid smoking to get the best benefit. What side effects may I notice from receiving this medicine? Side effects that you should report to your doctor or health care professional as soon as possible: -allergic reactions like skin rash, itching or hives, swelling of the face, lips, or tongue -blue tint to skin -chest tightness, pain -difficulty breathing, wheezing -dizziness -red, swollen painful area on the leg Side effects that usually do not require medical attention (report to your doctor or health care professional if they continue or are bothersome): -diarrhea -headache This list may not describe all possible side effects. Call your doctor for medical advice about side effects. You may report side effects to FDA at 1-800-FDA-1088. Where should I keep my medicine? Keep out of the reach of children. Store at room temperature between 15 and 30 degrees C (59 and 85 degrees F). Protect from light. Throw away any unused medicine after the expiration date. NOTE: This sheet is a summary. It may not cover all possible information. If you have questions about this medicine, talk to your doctor, pharmacist, or   health care provider.  2019 Elsevier/Gold Standard (2007-06-22 22:10:20)  

## 2018-06-16 ENCOUNTER — Inpatient Hospital Stay: Payer: Medicare Other | Attending: Oncology

## 2018-06-16 ENCOUNTER — Other Ambulatory Visit: Payer: Self-pay

## 2018-06-16 DIAGNOSIS — D510 Vitamin B12 deficiency anemia due to intrinsic factor deficiency: Secondary | ICD-10-CM | POA: Insufficient documentation

## 2018-06-16 MED ORDER — CYANOCOBALAMIN 1000 MCG/ML IJ SOLN
1000.0000 ug | Freq: Once | INTRAMUSCULAR | Status: AC
  Administered 2018-06-16: 1000 ug via INTRAMUSCULAR

## 2018-06-16 MED ORDER — CYANOCOBALAMIN 1000 MCG/ML IJ SOLN
INTRAMUSCULAR | Status: AC
  Filled 2018-06-16: qty 1

## 2018-08-11 ENCOUNTER — Inpatient Hospital Stay: Payer: Medicare Other | Attending: Oncology

## 2018-08-11 ENCOUNTER — Other Ambulatory Visit: Payer: Self-pay

## 2018-08-11 VITALS — BP 142/69 | HR 65 | Temp 98.2°F | Resp 18

## 2018-08-11 DIAGNOSIS — D510 Vitamin B12 deficiency anemia due to intrinsic factor deficiency: Secondary | ICD-10-CM | POA: Diagnosis present

## 2018-08-11 MED ORDER — CYANOCOBALAMIN 1000 MCG/ML IJ SOLN
1000.0000 ug | Freq: Once | INTRAMUSCULAR | Status: AC
  Administered 2018-08-11: 1000 ug via INTRAMUSCULAR

## 2018-08-11 MED ORDER — CYANOCOBALAMIN 1000 MCG/ML IJ SOLN
INTRAMUSCULAR | Status: AC
  Filled 2018-08-11: qty 1

## 2018-08-11 NOTE — Patient Instructions (Signed)
Cyanocobalamin, Vitamin B12 injection What is this medicine? CYANOCOBALAMIN (sye an oh koe BAL a min) is a man made form of vitamin B12. Vitamin B12 is used in the growth of healthy blood cells, nerve cells, and proteins in the body. It also helps with the metabolism of fats and carbohydrates. This medicine is used to treat people who can not absorb vitamin B12. This medicine may be used for other purposes; ask your health care provider or pharmacist if you have questions. COMMON BRAND NAME(S): B-12 Compliance Kit, B-12 Injection Kit, Cyomin, LA-12, Nutri-Twelve, Physicians EZ Use B-12, Primabalt What should I tell my health care provider before I take this medicine? They need to know if you have any of these conditions: -kidney disease -Leber's disease -megaloblastic anemia -an unusual or allergic reaction to cyanocobalamin, cobalt, other medicines, foods, dyes, or preservatives -pregnant or trying to get pregnant -breast-feeding How should I use this medicine? This medicine is injected into a muscle or deeply under the skin. It is usually given by a health care professional in a clinic or doctor's office. However, your doctor may teach you how to inject yourself. Follow all instructions. Talk to your pediatrician regarding the use of this medicine in children. Special care may be needed. Overdosage: If you think you have taken too much of this medicine contact a poison control center or emergency room at once. NOTE: This medicine is only for you. Do not share this medicine with others. What if I miss a dose? If you are given your dose at a clinic or doctor's office, call to reschedule your appointment. If you give your own injections and you miss a dose, take it as soon as you can. If it is almost time for your next dose, take only that dose. Do not take double or extra doses. What may interact with this medicine? -colchicine -heavy alcohol intake This list may not describe all possible  interactions. Give your health care provider a list of all the medicines, herbs, non-prescription drugs, or dietary supplements you use. Also tell them if you smoke, drink alcohol, or use illegal drugs. Some items may interact with your medicine. What should I watch for while using this medicine? Visit your doctor or health care professional regularly. You may need blood work done while you are taking this medicine. You may need to follow a special diet. Talk to your doctor. Limit your alcohol intake and avoid smoking to get the best benefit. What side effects may I notice from receiving this medicine? Side effects that you should report to your doctor or health care professional as soon as possible: -allergic reactions like skin rash, itching or hives, swelling of the face, lips, or tongue -blue tint to skin -chest tightness, pain -difficulty breathing, wheezing -dizziness -red, swollen painful area on the leg Side effects that usually do not require medical attention (report to your doctor or health care professional if they continue or are bothersome): -diarrhea -headache This list may not describe all possible side effects. Call your doctor for medical advice about side effects. You may report side effects to FDA at 1-800-FDA-1088. Where should I keep my medicine? Keep out of the reach of children. Store at room temperature between 15 and 30 degrees C (59 and 85 degrees F). Protect from light. Throw away any unused medicine after the expiration date. NOTE: This sheet is a summary. It may not cover all possible information. If you have questions about this medicine, talk to your doctor, pharmacist, or   health care provider.  2019 Elsevier/Gold Standard (2007-06-22 22:10:20)  

## 2018-09-11 ENCOUNTER — Other Ambulatory Visit: Payer: Self-pay | Admitting: Orthopedic Surgery

## 2018-09-15 ENCOUNTER — Other Ambulatory Visit (HOSPITAL_COMMUNITY)
Admission: RE | Admit: 2018-09-15 | Discharge: 2018-09-15 | Disposition: A | Discharge status: Home / Self Care | Payer: Medicare Other | Source: Ambulatory Visit | Attending: Orthopedic Surgery | Admitting: Orthopedic Surgery

## 2018-09-15 DIAGNOSIS — Z1159 Encounter for screening for other viral diseases: Secondary | ICD-10-CM | POA: Diagnosis present

## 2018-09-15 LAB — SARS CORONAVIRUS 2 (TAT 6-24 HRS): SARS Coronavirus 2: NEGATIVE

## 2018-09-15 NOTE — Patient Instructions (Addendum)
YOU HAVE COMPLETED YOUR COVID TEST. PLEASE CONTINUE TO PRACTICE QUARANTINING UNTIL YOUR DAY OF SURGERY                  Kristen Nielsen    Your procedure is scheduled on:  09-18-2018  Report to [facility] Main  Entrance             Report to admitting at  0730 AM                  Call this number if you have problems the morning of surgery [phone]      Remember: Do not eat food After Midnight. YOU MAY HAVE CLEAR LIQUIDS FROM MIDNIGHT UNTIL 7:00AM. At 7:00AM Please finish the prescribed Pre-Surgery ENSURE drink. Nothing by mouth after you finish the ENSURE drink !    CLEAR LIQUID DIET   Foods Allowed                                                                     Foods Excluded  Coffee and tea, regular and decaf                             liquids that you cannot  Plain Jell-O in any flavor                                             see through such as: Fruit ices (not with fruit pulp)                                     milk, soups, orange juice  Iced Popsicles                                    All solid food Carbonated beverages, regular and diet                                    Cranberry, grape and apple juices Sports drinks like Gatorade Lightly seasoned clear broth or consume(fat free) Sugar, honey syrup  Sample Menu Breakfast                                Lunch                                     Supper Cranberry juice                    Beef broth                            Chicken broth Jell-O  Grape juice                           Apple juice Coffee or tea                        Jell-O                                      Popsicle                                                Coffee or tea                        Coffee or tea  _____________________________________________________________________               BRUSH YOUR TEETH MORNING OF SURGERY AND RINSE YOUR MOUTH OUT, NO CHEWING GUM CANDY OR  MINTS.     Take these medicines the morning of surgery with A SIP OF WATER: NONE                                   You may not have any metal on your body including hair pins and              piercings  Do not wear jewelry, make-up, lotions, powders or perfumes, deodorant             Do not wear nail polish.  Do not shave  48 hours prior to surgery.                 Do not bring valuables to the hospital. [facility] IS NOT             RESPONSIBLE   FOR VALUABLES.  Contacts, dentures or bridgework may not be worn into surgery.  Leave suitcase in the car. After surgery it may be brought to your room.                   Please read over the following fact sheets you were given: _____________________________________________________________________             [facility] - Preparing for [facility] Before surgery, you can play an important role.  Because skin is not sterile, your skin needs to be as free of germs as possible.  You can reduce the number of germs on your skin by washing with CHG (chlorahexidine gluconate) soap before surgery.  CHG is an antiseptic cleaner which kills germs and bonds with the skin to continue killing germs even after washing. Please DO NOT use if you have an allergy to CHG or antibacterial soaps.  If your skin becomes reddened/irritated stop using the CHG and inform your nurse when you arrive at Short Stay. Do not shave (including legs and underarms) for at least 48 hours prior to the first CHG shower.  You may shave your face/neck. Please follow these instructions carefully:  1.  Shower with CHG Soap the night before surgery and the  morning of Surgery.  2.  If you choose to wash your hair, wash your hair first as usual  with your  normal  shampoo.  3.  After you shampoo, rinse your hair and body thoroughly to remove the  shampoo.                           4.  Use CHG as you would any other liquid soap.  You can apply chg directly  to the skin and wash                        Gently with a scrungie or clean washcloth.  5.  Apply the CHG Soap to your body ONLY FROM THE NECK DOWN.   Do not use on face/ open                           Wound or open sores. Avoid contact with eyes, ears mouth and genitals (private parts).                       Wash face,  Genitals (private parts) with your normal soap.             6.  Wash thoroughly, paying special attention to the area where your surgery  will be performed.  7.  Thoroughly rinse your body with warm water from the neck down.  8.  DO NOT shower/wash with your normal soap after using and rinsing off  the CHG Soap.                9.  Pat yourself dry with a clean towel.            10.  Wear clean pajamas.            11.  Place clean sheets on your bed the night of your first shower and do not  sleep with pets. Day of Surgery : Do not apply any lotions/deodorants the morning of surgery.  Please wear clean clothes to the hospital/surgery center.  FAILURE TO FOLLOW THESE INSTRUCTIONS MAY RESULT IN THE CANCELLATION OF YOUR SURGERY PATIENT SIGNATURE_________________________________  NURSE SIGNATURE__________________________________  ________________________________________________________________________   Kristen MireIncentive Spirometer  An incentive spirometer is a tool that can help keep your lungs clear and active. This tool measures how well you are filling your lungs with each breath. Taking long deep breaths may help reverse or decrease the chance of developing breathing (pulmonary) problems (especially infection) following:  A long period of time when you are unable to move or be active. BEFORE THE PROCEDURE   If the spirometer includes an indicator to show your best effort, your nurse or respiratory therapist will set it to a desired goal.  If possible, sit up straight or lean slightly forward. Try not to slouch.  Hold the incentive spirometer in an upright position. INSTRUCTIONS FOR USE  1. Sit on the  edge of your bed if possible, or sit up as far as you can in bed or on a chair. 2. Hold the incentive spirometer in an upright position. 3. Breathe out normally. 4. Place the mouthpiece in your mouth and seal your lips tightly around it. 5. Breathe in slowly and as deeply as possible, raising the piston or the ball toward the top of the column. 6. Hold your breath for 3-5 seconds or for as long as possible. Allow the piston or ball to fall to the bottom of the column. 7. Remove the mouthpiece from  your mouth and breathe out normally. 8. Rest for a few seconds and repeat Steps 1 through 7 at least 10 times every 1-2 hours when you are awake. Take your time and take a few normal breaths between deep breaths. 9. The spirometer may include an indicator to show your best effort. Use the indicator as a goal to work toward during each repetition. 10. After each set of 10 deep breaths, practice coughing to be sure your lungs are clear. If you have an incision (the cut made at the time of surgery), support your incision when coughing by placing a pillow or rolled up towels firmly against it. Once you are able to get out of bed, walk around indoors and cough well. You may stop using the incentive spirometer when instructed by your caregiver.  RISKS AND COMPLICATIONS  Take your time so you do not get dizzy or light-headed.  If you are in pain, you may need to take or ask for pain medication before doing incentive spirometry. It is harder to take a deep breath if you are having pain. AFTER USE  Rest and breathe slowly and easily.  It can be helpful to keep track of a log of your progress. Your caregiver can provide you with a simple table to help with this. If you are using the spirometer at home, follow these instructions: SEEK MEDICAL CARE IF:   You are having difficultly using the spirometer.  You have trouble using the spirometer as often as instructed.  Your pain medication is not giving enough  relief while using the spirometer.  You develop fever of 100.5 F (38.1 C) or higher. SEEK IMMEDIATE MEDICAL CARE IF:   You cough up bloody sputum that had not been present before.  You develop fever of 102 F (38.9 C) or greater.  You develop worsening pain at or near the incision site. MAKE SURE YOU:   Understand these instructions.  Will watch your condition.  Will get help right away if you are not doing well or get worse. Document Released: 07/22/2006 Document Revised: 06/03/2011 Document Reviewed: 09/22/2006 [facility] Patient Information 2014 [employer] [place].   ________________________________________________________________________

## 2018-09-16 ENCOUNTER — Other Ambulatory Visit: Payer: Self-pay

## 2018-09-16 ENCOUNTER — Ambulatory Visit (HOSPITAL_COMMUNITY)
Admission: RE | Admit: 2018-09-16 | Discharge: 2018-09-16 | Disposition: A | Discharge status: Home / Self Care | Payer: Medicare Other | Source: Ambulatory Visit | Attending: Orthopedic Surgery | Admitting: Orthopedic Surgery

## 2018-09-16 ENCOUNTER — Encounter (HOSPITAL_COMMUNITY)
Admission: RE | Admit: 2018-09-16 | Discharge: 2018-09-16 | Disposition: A | Discharge status: Home / Self Care | Payer: Medicare Other | Source: Ambulatory Visit | Attending: Orthopedic Surgery | Admitting: Orthopedic Surgery

## 2018-09-16 ENCOUNTER — Encounter (HOSPITAL_COMMUNITY): Payer: Self-pay

## 2018-09-16 ENCOUNTER — Ambulatory Visit (INDEPENDENT_AMBULATORY_CARE_PROVIDER_SITE_OTHER): Payer: Medicare Other | Admitting: Internal Medicine

## 2018-09-16 ENCOUNTER — Encounter: Payer: Self-pay | Admitting: Internal Medicine

## 2018-09-16 VITALS — BP 138/64 | HR 66 | Temp 98.5°F | Ht 66.0 in | Wt 192.0 lb

## 2018-09-16 DIAGNOSIS — E538 Deficiency of other specified B group vitamins: Secondary | ICD-10-CM | POA: Insufficient documentation

## 2018-09-16 DIAGNOSIS — D510 Vitamin B12 deficiency anemia due to intrinsic factor deficiency: Secondary | ICD-10-CM

## 2018-09-16 DIAGNOSIS — M25562 Pain in left knee: Secondary | ICD-10-CM

## 2018-09-16 DIAGNOSIS — Z01811 Encounter for preprocedural respiratory examination: Secondary | ICD-10-CM | POA: Diagnosis not present

## 2018-09-16 DIAGNOSIS — G8929 Other chronic pain: Secondary | ICD-10-CM

## 2018-09-16 DIAGNOSIS — Z01818 Encounter for other preprocedural examination: Secondary | ICD-10-CM | POA: Insufficient documentation

## 2018-09-16 DIAGNOSIS — M1712 Unilateral primary osteoarthritis, left knee: Secondary | ICD-10-CM | POA: Diagnosis not present

## 2018-09-16 DIAGNOSIS — Z7982 Long term (current) use of aspirin: Secondary | ICD-10-CM | POA: Insufficient documentation

## 2018-09-16 HISTORY — DX: Unspecified osteoarthritis, unspecified site: M19.90

## 2018-09-16 HISTORY — DX: Other allergy status, other than to drugs and biological substances: Z91.09

## 2018-09-16 LAB — CBC WITH DIFFERENTIAL/PLATELET
Abs Immature Granulocytes: 0.03 10*3/uL (ref 0.00–0.07)
Basophils Absolute: 0 10*3/uL (ref 0.0–0.1)
Basophils Relative: 0 %
Eosinophils Absolute: 0.3 10*3/uL (ref 0.0–0.5)
Eosinophils Relative: 4 %
HCT: 41 % (ref 36.0–46.0)
Hemoglobin: 12.6 g/dL (ref 12.0–15.0)
Immature Granulocytes: 0 %
Lymphocytes Relative: 19 %
Lymphs Abs: 1.7 10*3/uL (ref 0.7–4.0)
MCH: 26.5 pg (ref 26.0–34.0)
MCHC: 30.7 g/dL (ref 30.0–36.0)
MCV: 86.3 fL (ref 80.0–100.0)
Monocytes Absolute: 0.9 10*3/uL (ref 0.1–1.0)
Monocytes Relative: 10 %
Neutro Abs: 6 10*3/uL (ref 1.7–7.7)
Neutrophils Relative %: 67 %
Platelets: 260 10*3/uL (ref 150–400)
RBC: 4.75 MIL/uL (ref 3.87–5.11)
RDW: 13.2 % (ref 11.5–15.5)
WBC: 8.9 10*3/uL (ref 4.0–10.5)
nRBC: 0 % (ref 0.0–0.2)

## 2018-09-16 LAB — SURGICAL PCR SCREEN
MRSA, PCR: NEGATIVE
Staphylococcus aureus: NEGATIVE

## 2018-09-16 LAB — COMPREHENSIVE METABOLIC PANEL
ALT: 19 U/L (ref 0–44)
AST: 20 U/L (ref 15–41)
Albumin: 4.3 g/dL (ref 3.5–5.0)
Alkaline Phosphatase: 120 U/L (ref 38–126)
Anion gap: 7 (ref 5–15)
BUN: 18 mg/dL (ref 8–23)
CO2: 26 mmol/L (ref 22–32)
Calcium: 9.7 mg/dL (ref 8.9–10.3)
Chloride: 106 mmol/L (ref 98–111)
Creatinine, Ser: 0.81 mg/dL (ref 0.44–1.00)
GFR calc Af Amer: >60 mL/min (ref >60)
GFR calc non Af Amer: >60 mL/min (ref >60)
Glucose, Bld: 92 mg/dL (ref 70–99)
Potassium: 4.4 mmol/L (ref 3.5–5.1)
Sodium: 139 mmol/L (ref 135–145)
Total Bilirubin: 0.3 mg/dL (ref 0.3–1.2)
Total Protein: 7.4 g/dL (ref 6.5–8.1)

## 2018-09-16 LAB — URINALYSIS, ROUTINE W REFLEX MICROSCOPIC
Bilirubin Urine: NEGATIVE
Glucose, UA: NEGATIVE mg/dL
Hgb urine dipstick: NEGATIVE
Ketones, ur: NEGATIVE mg/dL
Leukocytes,Ua: NEGATIVE
Nitrite: NEGATIVE
Protein, ur: NEGATIVE mg/dL
Specific Gravity, Urine: 1.006 (ref 1.005–1.030)
pH: 6 (ref 5.0–8.0)

## 2018-09-16 LAB — PROTIME-INR
INR: 1.1 (ref 0.8–1.2)
Prothrombin Time: 14.3 seconds (ref 11.4–15.2)

## 2018-09-16 LAB — APTT: aPTT: 29 seconds (ref 24–36)

## 2018-09-16 NOTE — Assessment & Plan Note (Addendum)
The patient is medically clear for her left knee total knee replacement on Friday.  I reviewed her today's lab work and her EKG-all within normal limits.  She can stop her daily baby aspirin today. Thank you!

## 2018-09-16 NOTE — Progress Notes (Signed)
Subjective:  Patient ID: Kristen Nielsen, female    DOB: 1950-04-10  Age: 68 y.o. MRN: [id number]  CC: No chief complaint on file.   HPI Kristen Nielsen presents for IM consult Req by Dr Berenice Primas  Reason: L knee TKR this Friday - med clearance Hx: F/u vitamin B12 deficiency, osteoarthritis, history of anemia-all stable.  Kristen Nielsen had lab work and EKG done today as a part of her preop exam  Past Medical History:  Diagnosis Date   Anemia    Arthritis    Environmental allergies    Vitamin B 12 deficiency    Past Surgical History:  Procedure Laterality Date   TONSILLECTOMY AND ADENOIDECTOMY      reports that she has never smoked. She has never used smokeless tobacco. She reports current alcohol use. She reports that she does not use drugs. family history includes Breast cancer (age of onset: 70) in her sister; Breast cancer (age of onset: 41) in her paternal aunt; Cancer in her father; Cancer (age of onset: 43) in her brother; Diabetes in her father; Hypertension in her father and mother; Thyroid disease in her father. No Known Allergies   Outpatient Medications Prior to Visit  Medication Sig Dispense Refill   acetaminophen (TYLENOL) 500 MG tablet Take 500 mg by mouth daily as needed for moderate pain.     aspirin EC 81 MG tablet Take 81 mg by mouth daily.     Cholecalciferol (VITAMIN D) 50 MCG (2000 UT) tablet Take 2,000 Units by mouth daily.     conjugated estrogens (PREMARIN) vaginal cream 1/2 gm vaginally twice weekly 30 g 3   cyanocobalamin (COBAL-1000) 1000 MCG/ML injection Inject 1 mL (1,000 mcg total) into the muscle every 30 (thirty) days. (Patient taking differently: Inject 1,000 mcg into the muscle See admin instructions. Every other month) 10 mL 6   Homeopathic Products (LEG CRAMP RELIEF PO) Take 1 tablet by mouth daily as needed (leg cramps).      meloxicam (MOBIC) 15 MG tablet Take 15 mg by mouth daily as needed for pain.     naproxen sodium (ALEVE) 220 MG  tablet Take 220 mg by mouth daily as needed (pain).      Ophthalmic Irrigation Solution (EYE [place] OP) Place 1 Dose into both eyes daily as needed (irritation).     No facility-administered medications prior to visit.     ROS: Review of Systems  Constitutional: Negative for activity change, appetite change, chills, fatigue and unexpected weight change.  HENT: Negative for congestion, mouth sores and sinus pressure.   Eyes: Negative for visual disturbance.  Respiratory: Negative for apnea, cough, chest tightness, shortness of breath and wheezing.   Cardiovascular: Negative for chest pain, palpitations and leg swelling.  Gastrointestinal: Negative for abdominal pain and nausea.  Genitourinary: Negative for difficulty urinating, frequency and vaginal pain.  Musculoskeletal: Negative for back pain and gait problem.  Skin: Negative for pallor and rash.  Neurological: Negative for dizziness, tremors, seizures, weakness, numbness and headaches.  Psychiatric/Behavioral: Negative for confusion, sleep disturbance and suicidal ideas. The patient is not nervous/anxious.     Objective:  BP 138/64 (BP Location: Left Arm, Patient Position: Sitting, Cuff Size: Large)    Pulse 66    Temp 98.5 F (36.9 C) (Oral)    Ht 5\' 6"  (1.676 m)    Wt 192 lb (87.1 kg)    LMP 03/25/2008    SpO2 97%    BMI 30.99 kg/m   BP Readings from  Last 3 Encounters:  09/16/18 138/64  09/16/18 136/70  08/11/18 (!) 142/69    Wt Readings from Last 3 Encounters:  09/16/18 192 lb (87.1 kg)  09/16/18 191 lb 9 oz (86.9 kg)  11/04/17 191 lb 8 oz (86.9 kg)    Physical Exam Constitutional:      General: She is not in acute distress.    Appearance: She is well-developed.  HENT:     Head: Normocephalic.     Right Ear: External ear normal.     Left Ear: External ear normal.     Nose: Nose normal.  Eyes:     General:        Right eye: No discharge.        Left eye: No discharge.     Conjunctiva/sclera: Conjunctivae normal.      Pupils: Pupils are equal, round, and reactive to light.  Neck:     Musculoskeletal: Normal range of motion and neck supple.     Thyroid: No thyromegaly.     Vascular: No JVD.     Trachea: No tracheal deviation.  Cardiovascular:     Rate and Rhythm: Normal rate and regular rhythm.     Heart sounds: Normal heart sounds.  Pulmonary:     Effort: No respiratory distress.     Breath sounds: No stridor. No wheezing.  Abdominal:     General: Bowel sounds are normal. There is no distension.     Palpations: Abdomen is soft. There is no mass.     Tenderness: There is no abdominal tenderness. There is no guarding or rebound.  Musculoskeletal:        General: Tenderness present.  Lymphadenopathy:     Cervical: No cervical adenopathy.  Skin:    Findings: No erythema or rash.  Neurological:     Mental Status: She is oriented to person, place, and time.     Cranial Nerves: No cranial nerve deficit.     Motor: No abnormal muscle tone.     Coordination: Coordination normal.     Deep Tendon Reflexes: Reflexes normal.  Psychiatric:        Behavior: Behavior normal.        Thought Content: Thought content normal.        Judgment: Judgment normal.   Left knee is painful range of motion   EKG normal   Lab Results  Component Value Date   WBC 8.9 09/16/2018   HGB 12.6 09/16/2018   HCT 41.0 09/16/2018   PLT 260 09/16/2018   GLUCOSE 92 09/16/2018   CHOL 197 04/21/2013   TRIG 78.0 04/21/2013   HDL 62.50 04/21/2013   LDLCALC 119 (H) 04/21/2013   ALT 19 09/16/2018   AST 20 09/16/2018   NA 139 09/16/2018   K 4.4 09/16/2018   CL 106 09/16/2018   CREATININE 0.81 09/16/2018   BUN 18 09/16/2018   CO2 26 09/16/2018   TSH 0.596 11/04/2017   INR 1.1 09/16/2018    No results found.  Assessment & Plan:   There are no diagnoses linked to this encounter.   No orders of the defined types were placed in this encounter.    Follow-up: No follow-ups on file.  Sonda PrimesAlex Zoran Yankee, MD

## 2018-09-16 NOTE — Assessment & Plan Note (Addendum)
On B12 inj.  The patient is compliant with treatment

## 2018-09-16 NOTE — Patient Instructions (Signed)

## 2018-09-16 NOTE — Assessment & Plan Note (Signed)
TKR L pending

## 2018-09-17 MED ORDER — BUPIVACAINE LIPOSOME 1.3 % IJ SUSP
20.0000 mL | Freq: Once | INTRAMUSCULAR | Status: DC
  Filled 2018-09-17: qty 20

## 2018-09-17 NOTE — Anesthesia Preprocedure Evaluation (Addendum)
Anesthesia Evaluation  Patient identified by MRN, date of birth, ID band Patient awake    Reviewed: Allergy & Precautions, NPO status , Patient's Chart, lab work & pertinent test results  Airway Mallampati: II  TM Distance: >3 FB Neck ROM: Full    Dental no notable dental hx. (+) Teeth Intact, Dental Advisory Given   Pulmonary neg pulmonary ROS,    Pulmonary exam normal breath sounds clear to auscultation       Cardiovascular negative cardio ROS Normal cardiovascular exam Rhythm:Regular Rate:Normal     Neuro/Psych negative neurological ROS  negative psych ROS   GI/Hepatic negative GI ROS, Neg liver ROS,   Endo/Other  negative endocrine ROS  Renal/GU negative Renal ROS  negative genitourinary   Musculoskeletal  (+) Arthritis ,   Abdominal   Peds  Hematology negative hematology ROS (+)   Anesthesia Other Findings   Reproductive/Obstetrics                            Anesthesia Physical Anesthesia Plan  ASA: II  Anesthesia Plan: Spinal and Regional   Post-op Pain Management:  Regional for Post-op pain   Induction:   PONV Risk Score and Plan: 2 and Treatment may vary due to age or medical condition, Midazolam, Ondansetron and Dexamethasone  Airway Management Planned: Natural Airway  Additional Equipment:   Intra-op Plan:   Post-operative Plan:   Informed Consent: I have reviewed the patients History and Physical, chart, labs and discussed the procedure including the risks, benefits and alternatives for the proposed anesthesia with the patient or authorized representative who has indicated his/her understanding and acceptance.     Dental advisory given  Plan Discussed with: CRNA  Anesthesia Plan Comments:        Anesthesia Quick Evaluation

## 2018-09-17 NOTE — Progress Notes (Signed)
Anesthesia Chart Review   Case: [id number] Date/Time: 09/18/18 0945   Procedure: TOTAL KNEE ARTHROPLASTY (Left )   Anesthesia type: Spinal   Pre-op diagnosis: LEFT KNEE OSTEOARTHRITIS   Location: [facility] / [facility]   Surgeon: Dorna Leitz, MD      DISCUSSION:68 y.o. never smoker with h/o anemia, left knee OA scheduled for above procedure 09/18/2018 with Dr. Dorna Leitz.   Pt last seen by PCP, Dr. Lew Dawes, 09/16/2018.  Per his OV note, "Pt patient is medically clear for her left knee total knee replacement on Friday.  I reviewed today's lab work and her EKG-all within normal limits.  She can stop her daily baby aspirin today."  Chest xray results forwarded to PCP.    Anticipate pt can proceed with planned procedure barring acute status change.   VS: BP 136/70   Pulse (!) 56   Temp 37 C (Oral)   Resp 18   Ht 5\' 6"  (1.676 m)   Wt 86.9 kg   LMP 03/25/2008   SpO2 100%   BMI 30.92 kg/m   PROVIDERS: Plotnikov, Evie Lacks, MD is PCP   LABS: Labs reviewed: Acceptable for surgery. (all labs ordered are listed, but only abnormal results are displayed)  Labs Reviewed  URINALYSIS, ROUTINE W REFLEX MICROSCOPIC - Abnormal; Notable for the following components:      Result Value   Color, Urine STRAW (*)    All other components within normal limits  SURGICAL PCR SCREEN  APTT  CBC WITH DIFFERENTIAL/PLATELET  COMPREHENSIVE METABOLIC PANEL  PROTIME-INR     IMAGES: Chest Xray 09/16/2018 IMPRESSION: 1. No comparisons. Asymmetric density of the left hilum of unclear significance. Recommend follow-up Chest CT (IV contrast preferred) to exclude mass or lymphadenopathy. 2. Otherwise no acute cardiopulmonary abnormality.  EKG: 09/16/2018 Rate 53 bpm Sinus bradycardia Minimal voltage criteria for LVH, may be normal variant Borderline ECG  CV:  Past Medical History:  Diagnosis Date  . Anemia   . Arthritis   . Environmental allergies   . Vitamin B 12 deficiency      Past Surgical History:  Procedure Laterality Date  . TONSILLECTOMY AND ADENOIDECTOMY      MEDICATIONS: . acetaminophen (TYLENOL) 500 MG tablet  . aspirin EC 81 MG tablet  . Cholecalciferol (VITAMIN D) 50 MCG (2000 UT) tablet  . conjugated estrogens (PREMARIN) vaginal cream  . cyanocobalamin (COBAL-1000) 1000 MCG/ML injection  . Homeopathic Products (LEG CRAMP RELIEF PO)  . meloxicam (MOBIC) 15 MG tablet  . naproxen sodium (ALEVE) 220 MG tablet  . Ophthalmic Irrigation Solution (EYE [place] OP)   No current facility-administered medications for this encounter.    Derrill Memo ON 09/18/2018] bupivacaine liposome (EXPAREL) 1.3 % injection 266 mg     Maia Plan [facility] Pre-Surgical Testing [phone] 09/17/18  12:56 PM

## 2018-09-18 ENCOUNTER — Encounter (HOSPITAL_COMMUNITY): Payer: Self-pay | Admitting: *Deleted

## 2018-09-18 ENCOUNTER — Ambulatory Visit (HOSPITAL_COMMUNITY): Payer: Medicare Other | Admitting: Physician Assistant

## 2018-09-18 ENCOUNTER — Ambulatory Visit (HOSPITAL_COMMUNITY)
Admission: RE | Admit: 2018-09-18 | Discharge: 2018-09-19 | Disposition: A | Discharge status: Home Health | Payer: Medicare Other | Attending: Orthopedic Surgery | Admitting: Orthopedic Surgery

## 2018-09-18 ENCOUNTER — Encounter (HOSPITAL_COMMUNITY)
Admission: RE | Disposition: A | Discharge status: Home Health | Payer: Self-pay | Source: Home / Self Care | Attending: Orthopedic Surgery

## 2018-09-18 ENCOUNTER — Other Ambulatory Visit: Payer: Self-pay

## 2018-09-18 ENCOUNTER — Ambulatory Visit (HOSPITAL_COMMUNITY): Payer: Medicare Other | Admitting: Anesthesiology

## 2018-09-18 DIAGNOSIS — M1712 Unilateral primary osteoarthritis, left knee: Secondary | ICD-10-CM | POA: Insufficient documentation

## 2018-09-18 DIAGNOSIS — Z79899 Other long term (current) drug therapy: Secondary | ICD-10-CM | POA: Insufficient documentation

## 2018-09-18 DIAGNOSIS — Z7982 Long term (current) use of aspirin: Secondary | ICD-10-CM | POA: Diagnosis not present

## 2018-09-18 HISTORY — PX: TOTAL KNEE ARTHROPLASTY: SHX125

## 2018-09-18 SURGERY — ARTHROPLASTY, KNEE, TOTAL
Anesthesia: Regional | Site: Knee | Laterality: Left

## 2018-09-18 MED ORDER — DOCUSATE SODIUM 100 MG PO CAPS
100.0000 mg | ORAL_CAPSULE | Freq: Two times a day (BID) | ORAL | Status: DC
  Administered 2018-09-18 – 2018-09-19 (×3): 100 mg via ORAL
  Filled 2018-09-18 (×3): qty 1

## 2018-09-18 MED ORDER — BUPIVACAINE-EPINEPHRINE (PF) 0.5% -1:200000 IJ SOLN
INTRAMUSCULAR | Status: DC | PRN
  Administered 2018-09-18: 20 mL via PERINEURAL

## 2018-09-18 MED ORDER — FENTANYL CITRATE (PF) 100 MCG/2ML IJ SOLN: 50.0000 ug | INTRAMUSCULAR | Status: DC

## 2018-09-18 MED ORDER — BUPIVACAINE IN DEXTROSE 0.75-8.25 % IT SOLN
INTRATHECAL | Status: DC | PRN
  Administered 2018-09-18: 1.6 mL via INTRATHECAL

## 2018-09-18 MED ORDER — ALUM & MAG HYDROXIDE-SIMETH 200-200-20 MG/5ML PO SUSP: 30.0000 mL | ORAL | Status: DC | PRN

## 2018-09-18 MED ORDER — DEXAMETHASONE SODIUM PHOSPHATE 10 MG/ML IJ SOLN
INTRAMUSCULAR | Status: AC
  Filled 2018-09-18: qty 1

## 2018-09-18 MED ORDER — CEFAZOLIN SODIUM-DEXTROSE 2-4 GM/100ML-% IV SOLN
2.0000 g | Freq: Four times a day (QID) | INTRAVENOUS | Status: AC
  Administered 2018-09-18 (×2): 2 g via INTRAVENOUS
  Filled 2018-09-18 (×2): qty 100

## 2018-09-18 MED ORDER — ASPIRIN EC 325 MG PO TBEC
325.0000 mg | DELAYED_RELEASE_TABLET | Freq: Two times a day (BID) | ORAL | Status: DC
  Administered 2018-09-18 – 2018-09-19 (×2): 325 mg via ORAL
  Filled 2018-09-18 (×2): qty 1

## 2018-09-18 MED ORDER — METHOCARBAMOL 500 MG IVPB - SIMPLE MED
500.0000 mg | Freq: Four times a day (QID) | INTRAVENOUS | Status: DC | PRN
  Filled 2018-09-18: qty 50

## 2018-09-18 MED ORDER — BUPIVACAINE-EPINEPHRINE (PF) 0.25% -1:200000 IJ SOLN
INTRAMUSCULAR | Status: AC
  Filled 2018-09-18: qty 30

## 2018-09-18 MED ORDER — SODIUM CHLORIDE 0.9 % IR SOLN
Status: DC | PRN
  Administered 2018-09-18: 1000 mL

## 2018-09-18 MED ORDER — FENTANYL CITRATE (PF) 100 MCG/2ML IJ SOLN
INTRAMUSCULAR | Status: AC
  Filled 2018-09-18: qty 2

## 2018-09-18 MED ORDER — PROPOFOL 500 MG/50ML IV EMUL
INTRAVENOUS | Status: DC | PRN
  Administered 2018-09-18: 20 mg via INTRAVENOUS
  Administered 2018-09-18: 30 mg via INTRAVENOUS

## 2018-09-18 MED ORDER — OXYCODONE-ACETAMINOPHEN 5-325 MG PO TABS: 1.0000 | ORAL_TABLET | Freq: Four times a day (QID) | ORAL | 0 refills | Status: DC | PRN

## 2018-09-18 MED ORDER — CELECOXIB 200 MG PO CAPS
200.0000 mg | ORAL_CAPSULE | Freq: Two times a day (BID) | ORAL | Status: DC
  Administered 2018-09-18 – 2018-09-19 (×3): 200 mg via ORAL
  Filled 2018-09-18 (×3): qty 1

## 2018-09-18 MED ORDER — MIDAZOLAM HCL 2 MG/2ML IJ SOLN
1.0000 mg | INTRAMUSCULAR | Status: DC
  Administered 2018-09-18: 2 mg via INTRAVENOUS

## 2018-09-18 MED ORDER — BUPIVACAINE LIPOSOME 1.3 % IJ SUSP
INTRAMUSCULAR | Status: DC | PRN
  Administered 2018-09-18: 20 mL

## 2018-09-18 MED ORDER — SODIUM CHLORIDE 0.9% FLUSH
INTRAVENOUS | Status: DC | PRN
  Administered 2018-09-18: 50 mL

## 2018-09-18 MED ORDER — POLYETHYLENE GLYCOL 3350 17 G PO PACK: 17.0000 g | PACK | Freq: Every day | ORAL | Status: DC | PRN

## 2018-09-18 MED ORDER — PROPOFOL 500 MG/50ML IV EMUL
INTRAVENOUS | Status: DC | PRN
  Administered 2018-09-18: 50 ug/kg/min via INTRAVENOUS

## 2018-09-18 MED ORDER — CEFAZOLIN SODIUM-DEXTROSE 2-4 GM/100ML-% IV SOLN
2.0000 g | INTRAVENOUS | Status: AC
  Administered 2018-09-18: 2 g via INTRAVENOUS
  Filled 2018-09-18: qty 100

## 2018-09-18 MED ORDER — ONDANSETRON HCL 4 MG/2ML IJ SOLN
INTRAMUSCULAR | Status: AC
  Filled 2018-09-18: qty 2

## 2018-09-18 MED ORDER — SODIUM CHLORIDE 0.9 % IV SOLN
INTRAVENOUS | Status: DC
  Administered 2018-09-18 – 2018-09-19 (×2): via INTRAVENOUS

## 2018-09-18 MED ORDER — WATER FOR IRRIGATION, STERILE IR SOLN
Status: DC | PRN
  Administered 2018-09-18: 2000 mL

## 2018-09-18 MED ORDER — ACETAMINOPHEN 325 MG PO TABS: 325.0000 mg | ORAL_TABLET | Freq: Four times a day (QID) | ORAL | Status: DC | PRN

## 2018-09-18 MED ORDER — SODIUM CHLORIDE (PF) 0.9 % IJ SOLN
INTRAMUSCULAR | Status: AC
  Filled 2018-09-18: qty 50

## 2018-09-18 MED ORDER — ONDANSETRON HCL 4 MG/2ML IJ SOLN: 4.0000 mg | Freq: Four times a day (QID) | INTRAMUSCULAR | Status: DC | PRN

## 2018-09-18 MED ORDER — ONDANSETRON HCL 4 MG PO TABS: 4.0000 mg | ORAL_TABLET | Freq: Four times a day (QID) | ORAL | Status: DC | PRN

## 2018-09-18 MED ORDER — FENTANYL CITRATE (PF) 100 MCG/2ML IJ SOLN: 25.0000 ug | INTRAMUSCULAR | Status: DC | PRN

## 2018-09-18 MED ORDER — TRANEXAMIC ACID-NACL 1000-0.7 MG/100ML-% IV SOLN
1000.0000 mg | INTRAVENOUS | Status: AC
  Administered 2018-09-18: 1000 mg via INTRAVENOUS
  Filled 2018-09-18: qty 100

## 2018-09-18 MED ORDER — OXYCODONE HCL 5 MG PO TABS
5.0000 mg | ORAL_TABLET | ORAL | Status: DC | PRN
  Administered 2018-09-18 – 2018-09-19 (×3): 5 mg via ORAL
  Filled 2018-09-18 (×3): qty 1

## 2018-09-18 MED ORDER — PROPOFOL 10 MG/ML IV BOLUS
INTRAVENOUS | Status: AC
  Filled 2018-09-18: qty 40

## 2018-09-18 MED ORDER — PHENYLEPHRINE HCL (PRESSORS) 10 MG/ML IV SOLN
INTRAVENOUS | Status: AC
  Filled 2018-09-18: qty 1

## 2018-09-18 MED ORDER — BUPIVACAINE-EPINEPHRINE (PF) 0.25% -1:200000 IJ SOLN
INTRAMUSCULAR | Status: DC | PRN
  Administered 2018-09-18: 30 mL

## 2018-09-18 MED ORDER — MAGNESIUM CITRATE PO SOLN: 1.0000 | Freq: Once | ORAL | Status: DC | PRN

## 2018-09-18 MED ORDER — DEXAMETHASONE SODIUM PHOSPHATE 10 MG/ML IJ SOLN
INTRAMUSCULAR | Status: DC | PRN
  Administered 2018-09-18: 10 mg via INTRAVENOUS

## 2018-09-18 MED ORDER — MIDAZOLAM HCL 2 MG/2ML IJ SOLN
INTRAMUSCULAR | Status: AC
  Administered 2018-09-18: 2 mg via INTRAVENOUS
  Filled 2018-09-18: qty 2

## 2018-09-18 MED ORDER — ACETAMINOPHEN 500 MG PO TABS
1000.0000 mg | ORAL_TABLET | Freq: Once | ORAL | Status: AC
  Administered 2018-09-18: 1000 mg via ORAL
  Filled 2018-09-18: qty 2

## 2018-09-18 MED ORDER — ASPIRIN EC 325 MG PO TBEC: 325.0000 mg | DELAYED_RELEASE_TABLET | Freq: Two times a day (BID) | ORAL | 0 refills | Status: DC

## 2018-09-18 MED ORDER — CHLORHEXIDINE GLUCONATE 4 % EX LIQD: 60.0000 mL | Freq: Once | CUTANEOUS | Status: DC

## 2018-09-18 MED ORDER — POVIDONE-IODINE 10 % EX SWAB
2.0000 "application " | Freq: Once | CUTANEOUS | Status: AC
  Administered 2018-09-18: 2 via TOPICAL

## 2018-09-18 MED ORDER — CLONIDINE HCL (ANALGESIA) 100 MCG/ML EP SOLN
EPIDURAL | Status: DC | PRN
  Administered 2018-09-18: 50 ug

## 2018-09-18 MED ORDER — HYDROMORPHONE HCL 1 MG/ML IJ SOLN: 0.5000 mg | INTRAMUSCULAR | Status: DC | PRN

## 2018-09-18 MED ORDER — TIZANIDINE HCL 2 MG PO TABS: 2.0000 mg | ORAL_TABLET | Freq: Three times a day (TID) | ORAL | 0 refills | Status: DC | PRN

## 2018-09-18 MED ORDER — DIPHENHYDRAMINE HCL 12.5 MG/5ML PO ELIX: 12.5000 mg | ORAL_SOLUTION | ORAL | Status: DC | PRN

## 2018-09-18 MED ORDER — PROPOFOL 10 MG/ML IV BOLUS
INTRAVENOUS | Status: AC
  Filled 2018-09-18: qty 20

## 2018-09-18 MED ORDER — DOCUSATE SODIUM 100 MG PO CAPS: 100.0000 mg | ORAL_CAPSULE | Freq: Two times a day (BID) | ORAL | 0 refills | Status: DC

## 2018-09-18 MED ORDER — METHOCARBAMOL 500 MG PO TABS
500.0000 mg | ORAL_TABLET | Freq: Four times a day (QID) | ORAL | Status: DC | PRN
  Administered 2018-09-18 – 2018-09-19 (×3): 500 mg via ORAL
  Filled 2018-09-18 (×3): qty 1

## 2018-09-18 MED ORDER — ONDANSETRON HCL 4 MG/2ML IJ SOLN
INTRAMUSCULAR | Status: DC | PRN
  Administered 2018-09-18: 4 mg via INTRAVENOUS

## 2018-09-18 MED ORDER — EPHEDRINE SULFATE-NACL 50-0.9 MG/10ML-% IV SOSY
PREFILLED_SYRINGE | INTRAVENOUS | Status: DC | PRN
  Administered 2018-09-18: 10 mg via INTRAVENOUS

## 2018-09-18 MED ORDER — BISACODYL 5 MG PO TBEC: 5.0000 mg | DELAYED_RELEASE_TABLET | Freq: Every day | ORAL | Status: DC | PRN

## 2018-09-18 MED ORDER — DEXAMETHASONE SODIUM PHOSPHATE 10 MG/ML IJ SOLN
10.0000 mg | Freq: Two times a day (BID) | INTRAMUSCULAR | Status: AC
  Administered 2018-09-18 – 2018-09-19 (×3): 10 mg via INTRAVENOUS
  Filled 2018-09-18 (×3): qty 1

## 2018-09-18 MED ORDER — GABAPENTIN 300 MG PO CAPS
300.0000 mg | ORAL_CAPSULE | Freq: Two times a day (BID) | ORAL | Status: DC
  Administered 2018-09-18 (×2): 300 mg via ORAL
  Filled 2018-09-18 (×2): qty 1

## 2018-09-18 MED ORDER — TRANEXAMIC ACID-NACL 1000-0.7 MG/100ML-% IV SOLN
1000.0000 mg | Freq: Once | INTRAVENOUS | Status: AC
  Administered 2018-09-18: 1000 mg via INTRAVENOUS
  Filled 2018-09-18: qty 100

## 2018-09-18 MED ORDER — LACTATED RINGERS IV SOLN
INTRAVENOUS | Status: DC
  Administered 2018-09-18 (×3): via INTRAVENOUS

## 2018-09-18 SURGICAL SUPPLY — 54 items
ATTUNE MED DOME PAT 38 KNEE (Knees) ×1 IMPLANT
ATTUNE MED DOME PAT 38MM KNEE (Knees) ×1 IMPLANT
ATTUNE PSFEM LTSZ6 NARCEM KNEE (Femur) ×2 IMPLANT
ATTUNE PSRP INSR SZ6 5 KNEE (Insert) ×1 IMPLANT
ATTUNE PSRP INSR SZ6 5MM KNEE (Insert) ×1 IMPLANT
BAG ZIPLOCK 12X15 (MISCELLANEOUS) ×3 IMPLANT
BANDAGE ACE 6X5 VEL STRL LF (GAUZE/BANDAGES/DRESSINGS) ×5 IMPLANT
BASE TIBIA ATTUNE KNEE SYS SZ6 (Knees) IMPLANT
BENZOIN TINCTURE PRP APPL 2/3 (GAUZE/BANDAGES/DRESSINGS) ×3 IMPLANT
BLADE SAGITTAL 25.0X1.19X90 (BLADE) ×2 IMPLANT
BLADE SAGITTAL 25.0X1.19X90MM (BLADE) ×1
BLADE SAW SGTL 11.0X1.19X90.0M (BLADE) ×3 IMPLANT
BLADE SURG SZ10 CARB STEEL (BLADE) ×6 IMPLANT
BOOTIES KNEE HIGH SLOAN (MISCELLANEOUS) ×3 IMPLANT
BOWL SMART MIX CTS (DISPOSABLE) ×3 IMPLANT
CEMENT HV SMART SET (Cement) ×6 IMPLANT
CLOSURE WOUND 1/2 X4 (GAUZE/BANDAGES/DRESSINGS) ×1
COVER SURGICAL LIGHT HANDLE (MISCELLANEOUS) ×3 IMPLANT
COVER WAND RF STERILE (DRAPES) IMPLANT
CUFF TOURN SGL QUICK 34 (TOURNIQUET CUFF) ×2
CUFF TRNQT CYL 34X4.125X (TOURNIQUET CUFF) ×1 IMPLANT
DECANTER SPIKE VIAL GLASS SM (MISCELLANEOUS) IMPLANT
DRAPE U-SHAPE 47X51 STRL (DRAPES) ×3 IMPLANT
DRSG AQUACEL AG ADV 3.5X10 (GAUZE/BANDAGES/DRESSINGS) ×3 IMPLANT
DURAPREP 26ML APPLICATOR (WOUND CARE) ×3 IMPLANT
ELECT REM PT RETURN 15FT ADLT (MISCELLANEOUS) ×3 IMPLANT
GLOVE BIOGEL PI IND STRL 8 (GLOVE) ×2 IMPLANT
GLOVE BIOGEL PI INDICATOR 8 (GLOVE) ×4
GLOVE ECLIPSE 7.5 STRL STRAW (GLOVE) ×6 IMPLANT
GOWN STRL REUS W/TWL XL LVL3 (GOWN DISPOSABLE) ×6 IMPLANT
HANDPIECE INTERPULSE COAX TIP (DISPOSABLE) ×2
HOLDER FOLEY CATH W/STRAP (MISCELLANEOUS) IMPLANT
HOOD PEEL AWAY FLYTE STAYCOOL (MISCELLANEOUS) ×9 IMPLANT
KIT TURNOVER KIT A (KITS) IMPLANT
MANIFOLD NEPTUNE II (INSTRUMENTS) ×3 IMPLANT
NEEDLE HYPO 22GX1.5 SAFETY (NEEDLE) ×3 IMPLANT
NS IRRIG 1000ML POUR BTL (IV SOLUTION) ×3 IMPLANT
PACK ICE MAXI GEL EZY WRAP (MISCELLANEOUS) ×3 IMPLANT
PACK TOTAL KNEE CUSTOM (KITS) ×3 IMPLANT
PADDING CAST COTTON 6X4 STRL (CAST SUPPLIES) ×3 IMPLANT
PIN STEINMAN FIXATION KNEE (PIN) ×2 IMPLANT
PIN THREADED HEADED SIGMA (PIN) ×2 IMPLANT
PROTECTOR NERVE ULNAR (MISCELLANEOUS) ×3 IMPLANT
SET HNDPC FAN SPRY TIP SCT (DISPOSABLE) ×1 IMPLANT
STRIP CLOSURE SKIN 1/2X4 (GAUZE/BANDAGES/DRESSINGS) ×1 IMPLANT
SUT MNCRL AB 3-0 PS2 18 (SUTURE) ×3 IMPLANT
SUT VIC AB 0 CT1 36 (SUTURE) ×3 IMPLANT
SUT VIC AB 1 CT1 36 (SUTURE) ×6 IMPLANT
SYR CONTROL 10ML LL (SYRINGE) ×6 IMPLANT
TIBIA ATTUNE KNEE SYS BASE SZ6 (Knees) ×3 IMPLANT
TRAY FOLEY MTR SLVR 16FR STAT (SET/KITS/TRAYS/PACK) ×3 IMPLANT
WATER STERILE IRR 1000ML POUR (IV SOLUTION) ×6 IMPLANT
WRAP KNEE MAXI GEL POST OP (GAUZE/BANDAGES/DRESSINGS) ×2 IMPLANT
YANKAUER SUCT BULB TIP 10FT TU (MISCELLANEOUS) ×3 IMPLANT

## 2018-09-18 NOTE — Brief Op Note (Signed)
09/18/2018  11:36 AM  PATIENT:  Kristen Nielsen  68 y.o. female  PRE-OPERATIVE DIAGNOSIS:  LEFT KNEE OSTEOARTHRITIS  POST-OPERATIVE DIAGNOSIS:  LEFT KNEE OSTEOARTHRITIS  PROCEDURE:  Procedure(s): TOTAL KNEE ARTHROPLASTY (Left)  SURGEON:  Surgeon(s) and Role:    Dorna Leitz, MD - Primary  PHYSICIAN ASSISTANT:   ASSISTANTS: jim bethune   ANESTHESIA:   spinal  EBL:  25 mL   BLOOD ADMINISTERED:none  DRAINS: none   LOCAL MEDICATIONS USED:  MARCAINE    and OTHER experel  SPECIMEN:  No Specimen  DISPOSITION OF SPECIMEN:  N/A  COUNTS:  YES  TOURNIQUET:   Total Tourniquet Time Documented: Thigh (Left) - 46 minutes Total: Thigh (Left) - 46 minutes   DICTATION: .Other Dictation: Dictation Number [id number]  PLAN OF CARE: Admit for overnight observation  PATIENT DISPOSITION:  PACU - hemodynamically stable.   Delay start of Pharmacological VTE agent (>24hrs) due to surgical blood loss or risk of bleeding: no

## 2018-09-18 NOTE — Plan of Care (Signed)
Received from PACU at 1314.  Doing well at current time.  Eatind a sandwich.  Tolerating CPM without difficulty

## 2018-09-18 NOTE — Evaluation (Signed)
Physical Therapy Evaluation Patient Details Name: Kristen Nielsen MRN: [id number] DOB: [birth year] Today's Date: 09/18/2018   History of Present Illness  s/p L TKA  Clinical Impression  Pt is s/p TKA resulting in the deficits listed below (see PT Problem List).  Pt amb ~ 25' with RW and min assist, anticip[ate pt will continue to  Progress well in acute setting, plan is for HHPT at d/c. Pt will benefit from skilled PT to increase their independence and safety with mobility to allow discharge to the venue listed below.      Follow Up Recommendations Follow surgeon's recommendation for DC plan and follow-up therapies(HHPT)    Equipment Recommendations  Other (comment)(to be delivered per pt)    Recommendations for Other Services       Precautions / Restrictions Precautions Precautions: Fall;Knee Restrictions Weight Bearing Restrictions: No Other Position/Activity Restrictions: WBAT      Mobility  Bed Mobility Overal bed mobility: Needs Assistance Bed Mobility: Supine to Sit     Supine to sit: Min assist     General bed mobility comments: assist with LLE  Transfers Overall transfer level: Needs assistance Equipment used: Rolling walker (2 wheeled) Transfers: Sit to/from Stand Sit to Stand: Min assist         General transfer comment: assist with anterior-superior wt shift and transition to RW, cues for hand placement and LLE management  Ambulation/Gait Ambulation/Gait assistance: Min assist;Min guard Gait Distance (Feet): 50 Feet Assistive device: Rolling walker (2 wheeled) Gait Pattern/deviations: Step-to pattern;Decreased weight shift to left     General Gait Details: cues for sequence and RW safety, especially with turns  Science writer    Modified Rankin (Stroke Patients Only)       Balance                                             Pertinent Vitals/Pain Pain Assessment: 0-10 Pain Score: 4   Pain Location: left thigh and knee Pain Descriptors / Indicators: Grimacing;Sore Pain Intervention(s): Premedicated before session;Monitored during session;Limited activity within patient's tolerance;Repositioned    Home Living Family/patient expects to be discharged to:: Private residence Living Arrangements: Children;Other relatives Available Help at Discharge: Family Type of Home: House Home Access: Stairs to enter Entrance Stairs-Rails: Right;Left;Can reach both Entrance Stairs-Number of Steps: 5-6 Home Layout: Two level;Able to live on main level with bedroom/bathroom Home Equipment: None      Prior Function Level of Independence: Independent               Hand Dominance        Extremity/Trunk Assessment   Upper Extremity Assessment Upper Extremity Assessment: Overall WFL for tasks assessed    Lower Extremity Assessment Lower Extremity Assessment: LLE deficits/detail LLE Deficits / Details: ankle WFL, knee extension and hip flexion 2+/3; AAROm ~ 6* to 50*       Communication   Communication: No difficulties  Cognition Arousal/Alertness: Awake/alert Behavior During Therapy: WFL for tasks assessed/performed Overall Cognitive Status: Within Functional Limits for tasks assessed                                        General Comments      Exercises Total Joint Exercises  Ankle Circles/Pumps: AROM;10 reps;Both Quad Sets: Both;5 reps;AROM;Limitations Quad Sets Limitations: L thigh pain   Assessment/Plan    PT Assessment Patient needs continued PT services  PT Problem List Decreased strength;Decreased range of motion;Decreased activity tolerance;Pain;Decreased mobility;Decreased knowledge of use of DME       PT Treatment Interventions DME instruction;Therapeutic exercise;Gait training;Functional mobility training;Therapeutic activities;Patient/family education;Stair training    PT Goals (Current goals can be found in the Care Plan  section)  Acute Rehab PT Goals PT Goal Formulation: With patient Time For Goal Achievement: 09/24/18 Potential to Achieve Goals: Good    Frequency 7X/week   Barriers to discharge        Co-evaluation               AM-PAC PT "6 Clicks" Mobility  Outcome Measure Help needed turning from your back to your side while in a flat bed without using bedrails?: A Little Help needed moving from lying on your back to sitting on the side of a flat bed without using bedrails?: A Little Help needed moving to and from a bed to a chair (including a wheelchair)?: A Little Help needed standing up from a chair using your arms (e.g., wheelchair or bedside chair)?: A Little Help needed to walk in hospital room?: A Little Help needed climbing 3-5 steps with a railing? : A Lot 6 Click Score: 17    End of Session Equipment Utilized During Treatment: Gait belt Activity Tolerance: Patient tolerated treatment well Patient left: in chair;with chair alarm set;with call bell/phone within reach   PT Visit Diagnosis: Difficulty in walking, not elsewhere classified (R26.2)    Time: [id number] PT Time Calculation (min) (ACUTE ONLY): 22 min   Charges:   PT Evaluation $PT Eval Low Complexity: 1 Low          Drucilla Chaletara Shiana Rappleye, PT  Pager: [phone] Acute Rehab Dept [facility]): [phone]   09/18/2018   [facility] Surgical Center IncWILLIAMS,Kristen Mcpartlin 09/18/2018, 4:54 PM

## 2018-09-18 NOTE — Anesthesia Procedure Notes (Addendum)
Anesthesia Regional Block: Adductor canal block   Pre-Anesthetic Checklist: ,, timeout performed, Correct Patient, Correct Site, Correct Laterality, Correct Procedure, Correct Position, site marked, Risks and benefits discussed,  Surgical consent,  Pre-op evaluation,  At surgeon's request and post-op pain management  Laterality: Left  Prep: Maximum Sterile Barrier Precautions used, chloraprep       Needles:  Injection technique: Single-shot  Needle Type: Echogenic Stimulator Needle     Needle Length: 9cm  Needle Gauge: 22     Additional Needles:   Procedures:,,,, ultrasound used (permanent image in chart),,,,  Narrative:  Start time: 09/18/2018 8:43 AM End time: 09/18/2018 8:53 AM Injection made incrementally with aspirations every 5 mL.  Performed by: Personally  Anesthesiologist: Freddrick March, MD  Additional Notes: Monitors applied. No increased pain on injection. No increased resistance to injection. Injection made in 5cc increments. Good needle visualization. Patient tolerated procedure well.

## 2018-09-18 NOTE — H&P (Addendum)
TOTAL KNEE ADMISSION H&P  Patient is being admitted for left total knee arthroplasty.  Subjective:  Chief Complaint:left knee pain.  HPI: Kristen Nielsen, 68 y.o. female, has a history of pain and functional disability in the left knee due to arthritis and has failed non-surgical conservative treatments for greater than 12 weeks to includeNSAID's and/or analgesics, corticosteriod injections, viscosupplementation injections, weight reduction as appropriate and activity modification.  Onset of symptoms was gradual, starting 3 years ago with gradually worsening course since that time. The patient noted no past surgery on the left knee(s).  Patient currently rates pain in the left knee(s) at 8 out of 10 with activity. Patient has night pain, worsening of pain with activity and weight bearing, pain that interferes with activities of daily living, pain with passive range of motion, crepitus and joint swelling.  Patient has evidence of subchondral sclerosis, periarticular osteophytes, joint subluxation and joint space narrowing by imaging studies. This patient has had Failure of all reasonable conservative care. There is no active infection.  Patient Active Problem List   Diagnosis Date Noted  . Preop exam for internal medicine 09/16/2018  . Family history of colon cancer 07/30/2016  . Knee pain, left 07/30/2016  . Seasonal allergies 09/29/2013  . Well adult 04/19/2013  . Pernicious anemia 01/28/2011   Past Medical History:  Diagnosis Date  . Anemia   . Arthritis   . Environmental allergies   . Vitamin B 12 deficiency     Past Surgical History:  Procedure Laterality Date  . TONSILLECTOMY AND ADENOIDECTOMY      Current Facility-Administered Medications  Medication Dose Route Frequency Provider Last Rate Last Dose  . bupivacaine liposome (EXPAREL) 1.3 % injection 266 mg  20 mL Other Once Jodi GeraldsGraves, Shirell Struthers, MD       Current Outpatient Medications  Medication Sig Dispense Refill Last Dose  .  acetaminophen (TYLENOL) 500 MG tablet Take 500 mg by mouth daily as needed for moderate pain.     Marland Kitchen. aspirin EC 81 MG tablet Take 81 mg by mouth daily.     . Cholecalciferol (VITAMIN D) 50 MCG (2000 UT) tablet Take 2,000 Units by mouth daily.     . cyanocobalamin (COBAL-1000) 1000 MCG/ML injection Inject 1 mL (1,000 mcg total) into the muscle every 30 (thirty) days. (Patient taking differently: Inject 1,000 mcg into the muscle See admin instructions. Every other month) 10 mL 6   . Homeopathic Products (LEG CRAMP RELIEF PO) Take 1 tablet by mouth daily as needed (leg cramps).      . meloxicam (MOBIC) 15 MG tablet Take 15 mg by mouth daily as needed for pain.     . naproxen sodium (ALEVE) 220 MG tablet Take 220 mg by mouth daily as needed (pain).      Marland Kitchen. Ophthalmic Irrigation Solution (EYE WASH OP) Place 1 Dose into both eyes daily as needed (irritation).     . conjugated estrogens (PREMARIN) vaginal cream 1/2 gm vaginally twice weekly 30 g 3 Completed Course at Unknown time   No Known Allergies  Social History   Tobacco Use  . Smoking status: Never Smoker  . Smokeless tobacco: Never Used  Substance Use Topics  . Alcohol use: Yes    Alcohol/week: 0.0 - 2.0 standard drinks    Comment: occ    Family History  Problem Relation Age of Onset  . Hypertension Mother   . Cancer Father        lung ca  . Hypertension Father   .  Diabetes Father   . Thyroid disease Father   . Breast cancer Sister 84  . Breast cancer Paternal Aunt 78  . Cancer Brother 58       col ca     ROS ROS: I have reviewed the patient's review of systems thoroughly and there are no positive responses as relates to the HPI. Objective:  Physical Exam  Vital signs in last 24 hours:    Vitals:   09/18/18 0926 09/18/18 0928  BP:    Pulse: (!) 57 60  Resp: 16 17  Temp:    SpO2: 99% 99%   Well-developed well-nourished patient in no acute distress. Alert and oriented x3 HEENT:within normal limits Cardiac: Regular  rate and rhythm Pulmonary: Lungs clear to auscultation Abdomen: Soft and nontender.  Normal active bowel sounds  Musculoskeletal: (Left knee: Painful range of motion.  Limited range of motion.  No instability.  Trace effusion.  Neurovascular intact distally.) Labs: Recent Results (from the past 2160 hour(s))  SARS Coronavirus 2 (Performed in [facility])     Status: None   Collection Time: 09/15/18 12:00 PM   Specimen: Nasal Swab  Result Value Ref Range   SARS Coronavirus 2 NEGATIVE NEGATIVE    Comment: (NOTE) SARS-CoV-2 target nucleic acids are NOT DETECTED. The SARS-CoV-2 RNA is generally detectable in upper and lower respiratory specimens during the acute phase of infection. Negative results do not preclude SARS-CoV-2 infection, do not rule out co-infections with other pathogens, and should not be used as the sole basis for treatment or other patient management decisions. Negative results must be combined with clinical observations, patient history, and epidemiological information. The expected result is Negative. Fact Sheet for Patients: TrashEliminator.se Fact Sheet for Healthcare Providers: WhoisBlogging.ch This test is not yet approved or cleared by the [place] FDA and  has been authorized for detection and/or diagnosis of SARS-CoV-2 by FDA under an Emergency Use Authorization (EUA). This EUA will remain  in effect (meaning this test can be used) for the duration of the COVID-19 declaration under Section 56 4(b)(1) of the Act, 21 U.S.C. section 360bbb-3(b)(1), unless the authorization is terminated or revoked sooner. Performed at [facility], [place] [address] [place], [place] [address]   Urinalysis, Routine w reflex microscopic     Status: Abnormal   Collection Time: 09/16/18  2:03 PM  Result Value Ref Range   Color, Urine STRAW (A) YELLOW   APPearance CLEAR CLEAR   Specific Gravity, Urine 1.006  1.005 - 1.030   pH 6.0 5.0 - 8.0   Glucose, UA NEGATIVE NEGATIVE mg/dL   Hgb urine dipstick NEGATIVE NEGATIVE   Bilirubin Urine NEGATIVE NEGATIVE   Ketones, ur NEGATIVE NEGATIVE mg/dL   Protein, ur NEGATIVE NEGATIVE mg/dL   Nitrite NEGATIVE NEGATIVE   Leukocytes,Ua NEGATIVE NEGATIVE    Comment: Performed at [facility] [address], [place], [place] [address]  Surgical pcr screen     Status: None   Collection Time: 09/16/18  2:03 PM   Specimen: Nasal Mucosa; Nasal Swab  Result Value Ref Range   MRSA, PCR NEGATIVE NEGATIVE   Staphylococcus aureus NEGATIVE NEGATIVE    Comment: (NOTE) The Xpert SA Assay (FDA approved for NASAL specimens in patients 7 years of age and older), is one component of a comprehensive surveillance program. It is not intended to diagnose infection nor to guide or monitor treatment. Performed at [facility], [place] [address], [place], [place] [address]  APTT     Status: None   Collection Time: 09/16/18  2:03 PM  Result Value Ref Range   aPTT 29 24 - 36 seconds    Comment: Performed at [facility], [address] [place], [place] [address]  CBC WITH DIFFERENTIAL     Status: None   Collection Time: 09/16/18  2:03 PM  Result Value Ref Range   WBC 8.9 4.0 - 10.5 K/uL   RBC 4.75 3.87 - 5.11 MIL/uL   Hemoglobin 12.6 12.0 - 15.0 g/dL   HCT 14.741.0 82.936.0 - 56.246.0 %   MCV 86.3 80.0 - 100.0 fL   MCH 26.5 26.0 - 34.0 pg   MCHC 30.7 30.0 - 36.0 g/dL   RDW 13.013.2 86.511.5 - 78.415.5 %   Platelets 260 150 - 400 K/uL   nRBC 0.0 0.0 - 0.2 %   Neutrophils Relative % 67 %   Neutro Abs 6.0 1.7 - 7.7 K/uL   Lymphocytes Relative 19 %   Lymphs Abs 1.7 0.7 - 4.0 K/uL   Monocytes Relative 10 %   Monocytes Absolute 0.9 0.1 - 1.0 K/uL   Eosinophils Relative 4 %   Eosinophils Absolute 0.3 0.0 - 0.5 K/uL   Basophils Relative 0 %   Basophils Absolute 0.0 0.0 - 0.1 K/uL   Immature Granulocytes 0 %   Abs Immature Granulocytes  0.03 0.00 - 0.07 K/uL    Comment: Performed at [facility], [address] [place], [place] [address]  Comprehensive metabolic panel     Status: None   Collection Time: 09/16/18  2:03 PM  Result Value Ref Range   Sodium 139 135 - 145 mmol/L   Potassium 4.4 3.5 - 5.1 mmol/L   Chloride 106 98 - 111 mmol/L   CO2 26 22 - 32 mmol/L   Glucose, Bld 92 70 - 99 mg/dL   BUN 18 8 - 23 mg/dL   Creatinine, Ser 5.280.81 0.44 - 1.00 mg/dL   Calcium 9.7 8.9 - 41.310.3 mg/dL   Total Protein 7.4 6.5 - 8.1 g/dL   Albumin 4.3 3.5 - 5.0 g/dL   AST 20 15 - 41 U/L   ALT 19 0 - 44 U/L   Alkaline Phosphatase 120 38 - 126 U/L   Total Bilirubin 0.3 0.3 - 1.2 mg/dL   GFR calc non Af Amer >60 >60 mL/min   GFR calc Af Amer >60 >60 mL/min   Anion gap 7 5 - 15    Comment: Performed at [facility], [address] [place], [place] [address]  Protime-INR     Status: None   Collection Time: 09/16/18  2:03 PM  Result Value Ref Range   Prothrombin Time 14.3 11.4 - 15.2 seconds   INR 1.1 0.8 - 1.2    Comment: (NOTE) INR goal varies based on device and disease states. Performed at [facility], [address] [place], [place] [address]     Estimated body mass index is 30.99 kg/m as calculated from the following:   Height as of 09/16/18: 5\' 6"  (1.676 m).   Weight as of 09/16/18: 87.1 kg.   Imaging Review Plain radiographs demonstrate severe degenerative joint disease of the left knee(s). The overall alignment ismild varus. The bone quality appears to be good for age and reported activity level.      Assessment/Plan:  End stage arthritis, left knee   The patient history, physical examination, clinical judgment of the provider and imaging studies  are consistent with end stage degenerative joint disease of the left knee(s) and total knee arthroplasty is deemed medically necessary. The treatment options including medical management, injection therapy  arthroscopy and arthroplasty were discussed at length. The risks and benefits of total knee arthroplasty were presented and reviewed. The risks due to aseptic loosening, infection, stiffness, patella tracking problems, thromboembolic complications and other imponderables were discussed. The patient acknowledged the explanation, agreed to proceed with the plan and consent was signed. Patient is being admitted for inpatient treatment for surgery, pain control, PT, OT, prophylactic antibiotics, VTE prophylaxis, progressive ambulation and ADL's and discharge planning. The patient is planning to be discharged home with home health services     Patient's anticipated LOS is less than 2 midnights, meeting these requirements: - Younger than 6565 - Lives within 1 hour of care - Has a competent adult at home to recover with post-op recover - NO history of  - Chronic pain requiring opiods  - Diabetes  - Coronary Artery Disease  - Heart failure  - Heart attack  - Stroke  - DVT/VTE  - Cardiac arrhythmia  - Respiratory Failure/COPD  - Renal failure  - Anemia  - Advanced Liver disease

## 2018-09-18 NOTE — Anesthesia Procedure Notes (Signed)
Date/Time: 09/18/2018 10:10 AM Performed by: Glory Buff, CRNA Oxygen Delivery Method: Simple face mask

## 2018-09-18 NOTE — Anesthesia Postprocedure Evaluation (Signed)
Anesthesia Post Note  Patient: KEMAYA DORNER  Procedure(s) Performed: TOTAL KNEE ARTHROPLASTY (Left Knee)     Patient location during evaluation: PACU Anesthesia Type: Regional and Spinal Level of consciousness: oriented and awake and alert Pain management: pain level controlled Vital Signs Assessment: post-procedure vital signs reviewed and stable Respiratory status: spontaneous breathing, respiratory function stable and patient connected to nasal cannula oxygen Cardiovascular status: blood pressure returned to baseline and stable Postop Assessment: no headache, no backache and no apparent nausea or vomiting Anesthetic complications: no    Last Vitals:  Vitals:   09/18/18 1314 09/18/18 1413  BP: 138/73 (!) 144/81  Pulse: 62 61  Resp: 16 15  Temp: (!) 36.4 C   SpO2: 99% 100%    Last Pain:  Vitals:   09/18/18 1344  TempSrc:   PainSc: 6                  Makaylia Hewett L Quaron Delacruz

## 2018-09-18 NOTE — Op Note (Signed)
NAMJacqlyn Larsen: Viscuso, Cherene V. MEDICAL RECORD [id number] ACCOUNT [id number] DATE OF BIRTH:1950-12-10 FACILITY: [facility] LOCATION: [facility] PHYSICIAN:Antwoin Lackey L. Aslynn Brunetti, MD  OPERATIVE REPORT  DATE OF PROCEDURE:  09/18/2018  PREOPERATIVE DIAGNOSIS:  End-stage degenerative joint disease, left knee, with severe bone-on-bone change.  POSTOPERATIVE DIAGNOSIS:  End-stage degenerative joint disease, left knee, with severe bone-on-bone change.  PROCEDURE:  Left total knee replacement with an Attune system size 6 narrow tibia, size 6 femur, 5 mm bridging bearing, and a 41 mm all polyethylene patella.  SURGEON:  Jodi GeraldsJohn Coralynn Gaona, MD  ASSISTANT:  Gus PumaJim Bethune PA-C, was present for the entire case and assisted by retraction, bone cuts, and closing to minimize OR time.  BRIEF HISTORY:  The patient is a 68 year old female with a long history of significant complaints of left knee pain.  She has been treated conservatively for a prolonged period of time.  She had failed injection therapy, activity modification, and  anti-inflammatory medication.  She was having night pain and light activity pain.  After failure of all conservative care, she was taken to the operating room for left total knee replacement.  DESCRIPTION OF PROCEDURE:  The patient was taken to the operating room after adequate anesthesia was obtained with a spinal anesthetic.  The patient was placed supine on the operating table.  The left leg was prepped and draped in the usual sterile  fashion.  Following this, the leg was exsanguinated, blood pressure tourniquet inflated to 300 mmHg.  Following this, a midline incision was made in the subcutaneous tissue to the level of the extensor mechanism, and a medial parapatellar arthrotomy was  undertaken.  Following this, medial and lateral meniscus was removed, retropatellar fat pad, synovium on the anterior aspect of the femur, and the anterior and posterior cruciates.  Following this, an intramedullary pilot hole was  drilled and a 5-degree  valgus inclination cut was made.  The attention was then turned towards the femur, which was sized to a 6.  Anterior and posterior cuts were made, chamfers and box.  Attention was then turned to the tibia.  It was cut perpendicular to its long axis,  3-degree posterior slope.  Following this, a 6 trial was placed and drilled and keeled.  Then the knee was put through a range of motion with a 5 mm spacer.  Excellent range of motion and stability were achieved.  Attention was then turned to the  patella.  It was cut down to a level of 13 mm.  A 38 paddle was chosen and lugs were drilled.  Knee was put through a range of motion.  Excellent stability and range of motion were achieved.  Attention at this time was placed on removal of trial  components.  The knee was copiously and thoroughly irrigated with pulsatile lavage irrigation and suctioned dry.  The final components were then cemented into place.  A size 6 narrow femur, size 6 tibia, 5 mm bridging bearing trial were placed followed  by a 38 patella placed and held with a clamp.  All excess bone cement was removed.  Cement was allowed to completely harden.  Once hardened, the tourniquet was let down.  All bleeders were controlled with electrocautery.  The ____ were closed with 1  Vicryl running for the medial parapatellar arthrotomy.  The skin was closed with 0 and 2-0 Vicryl and 3-0 Monocryl subcuticular.  Benzoin and Steri-Strips were applied.  A sterile compressive dressing was applied.  The patient was taken to recovery and  was noted  to be in satisfactory condition.  Estimated blood loss for the procedure was minimal.  Of note, Gaspar Skeeters was present for the entire case and assisted by retraction, bone cuts, and closing to minimize OR time.  LN/NUANCE  D:09/18/2018 T:09/18/2018 JOB:[id number]

## 2018-09-18 NOTE — Progress Notes (Signed)
Assisted Dr. woodrum with left, ultrasound guided, adductor canal block. Side rails up, monitors on throughout procedure. See vital signs in flow sheet. Tolerated Procedure well.  

## 2018-09-18 NOTE — Discharge Instructions (Signed)

## 2018-09-18 NOTE — Anesthesia Procedure Notes (Signed)
Spinal  Patient location during procedure: OR Start time: 09/18/2018 10:16 AM End time: 09/18/2018 10:21 AM Staffing Anesthesiologist: Freddrick March, MD Resident/CRNA: Glory Buff, CRNA Performed: resident/CRNA  Preanesthetic Checklist Completed: patient identified, site marked, surgical consent, pre-op evaluation, timeout performed, IV checked, risks and benefits discussed and monitors and equipment checked Spinal Block Patient position: sitting Prep: DuraPrep Patient monitoring: heart rate, cardiac monitor, continuous pulse ox and blood pressure Approach: midline Location: L3-4 Injection technique: single-shot Needle Needle type: Pencan  Needle gauge: 24 G Needle length: 9 cm Assessment Sensory level: T4 Additional Notes Kit expiration date checked and verified.  Sterile prep and drape, skin local with 1% lidocaine, - heme, - paraesthesia, + CSF pre and post injection, patient tolerated procedure well.

## 2018-09-18 NOTE — Transfer of Care (Signed)
Immediate Anesthesia Transfer of Care Note  Patient: Kristen Nielsen  Procedure(s) Performed: TOTAL KNEE ARTHROPLASTY (Left Knee)  Patient Location: PACU  Anesthesia Type:MAC and Spinal  Level of Consciousness: awake, alert  and oriented  Airway & Oxygen Therapy: Patient Spontanous Breathing and Patient connected to face mask oxygen  Post-op Assessment: Report given to RN and Post -op Vital signs reviewed and stable  Post vital signs: Reviewed and stable  Last Vitals:  Vitals Value Taken Time  BP 122/69 09/18/18 1158  Temp 36.5 C 09/18/18 1158  Pulse 67 09/18/18 1159  Resp 16 09/18/18 1159  SpO2 100 % 09/18/18 1159  Vitals shown include unvalidated device data.  Last Pain:  Vitals:   09/18/18 0845  TempSrc:   PainSc: 3       Patients Stated Pain Goal: 4 (13/08/65 7846)  Complications: No apparent anesthesia complications

## 2018-09-19 DIAGNOSIS — M1712 Unilateral primary osteoarthritis, left knee: Secondary | ICD-10-CM | POA: Diagnosis not present

## 2018-09-19 LAB — CBC
HCT: 35.9 % — ABNORMAL LOW (ref 36.0–46.0)
Hemoglobin: 10.9 g/dL — ABNORMAL LOW (ref 12.0–15.0)
MCH: 26.3 pg (ref 26.0–34.0)
MCHC: 30.4 g/dL (ref 30.0–36.0)
MCV: 86.5 fL (ref 80.0–100.0)
Platelets: 247 10*3/uL (ref 150–400)
RBC: 4.15 MIL/uL (ref 3.87–5.11)
RDW: 13.2 % (ref 11.5–15.5)
WBC: 14.8 10*3/uL — ABNORMAL HIGH (ref 4.0–10.5)
nRBC: 0 % (ref 0.0–0.2)

## 2018-09-19 NOTE — Plan of Care (Signed)
  Problem: Education: Goal: Knowledge of the prescribed therapeutic regimen will improve Outcome: Adequate for Discharge Goal: Individualized Educational Video(s) Outcome: Adequate for Discharge   Problem: Activity: Goal: Ability to avoid complications of mobility impairment will improve Outcome: Adequate for Discharge Goal: Range of joint motion will improve Outcome: Adequate for Discharge   Problem: Clinical Measurements: Goal: Postoperative complications will be avoided or minimized Outcome: Adequate for Discharge   Problem: Pain Management: Goal: Pain level will decrease with appropriate interventions Outcome: Adequate for Discharge   Problem: Skin Integrity: Goal: Will show signs of wound healing Outcome: Adequate for Discharge   Problem: Education: Goal: Knowledge of General Education information will improve Description: Including pain rating scale, medication(s)/side effects and non-pharmacologic comfort measures Outcome: Adequate for Discharge   Problem: Health Behavior/Discharge Planning: Goal: Ability to manage health-related needs will improve Outcome: Adequate for Discharge   Problem: Clinical Measurements: Goal: Ability to maintain clinical measurements within normal limits will improve Outcome: Adequate for Discharge   Problem: Activity: Goal: Risk for activity intolerance will decrease Outcome: Adequate for Discharge   Problem: Elimination: Goal: Will not experience complications related to bowel motility Outcome: Adequate for Discharge Goal: Will not experience complications related to urinary retention Outcome: Adequate for Discharge  Home with family. Discharge teaching done. Written information given.

## 2018-09-19 NOTE — Discharge Summary (Signed)
Patient ID: Kristen Nielsen MRN: [id number] DOB/AGE: 67-Apr-1952 67 y.o.  Admit date: 09/18/2018 Discharge date: 09/19/2018  Admission Diagnoses:  Principal Problem:   Primary osteoarthritis of left knee   Discharge Diagnoses:  Same  Past Medical History:  Diagnosis Date  . Anemia   . Arthritis   . Environmental allergies   . Vitamin B 12 deficiency     Surgeries: Procedure(s): TOTAL KNEE ARTHROPLASTY on 09/18/2018   Consultants:   Discharged Condition: Improved  Hospital Course: Kristen Larsenunice V Hommes is an 68 y.o. female who was admitted 09/18/2018 for operative treatment ofPrimary osteoarthritis of left knee. Patient has severe unremitting pain that affects sleep, daily activities, and work/hobbies. After pre-op clearance the patient was taken to the operating room on 09/18/2018 and underwent  Procedure(s): TOTAL KNEE ARTHROPLASTY.    Patient was seen on postoperative day 1.  She was doing well.  Pain was controlled.  She was icing her knee.  She denies any shortness of breath or chest pain.  She had not cleared physical therapy yet.  Exam: Left knee with dressing in place.  Ice on the dressing.  No tenderness palpation distal to the dressing.  Able to dorsiflex and plantarflex ankle.  Sensation intact on the dorsal plantar foot.  Foot is warm well perfused. Respirations even unlabored No acute distress Abdomen soft  Patient was given perioperative antibiotics:  Anti-infectives (From admission, onward)   Start     Dose/Rate Route Frequency Ordered Stop   09/18/18 1630  ceFAZolin (ANCEF) IVPB 2g/100 mL premix     2 g 200 mL/hr over 30 Minutes Intravenous Every 6 hours 09/18/18 1337 09/18/18 2236   09/18/18 0800  ceFAZolin (ANCEF) IVPB 2g/100 mL premix     2 g 200 mL/hr over 30 Minutes Intravenous On call to O.R. 09/18/18 0747 09/18/18 1052       Patient was given sequential compression devices, early ambulation, and chemoprophylaxis to prevent DVT.  Patient benefited  maximally from hospital stay and there were no complications.    Recent vital signs:  Patient Vitals for the past 24 hrs:  BP Temp Temp src Pulse Resp SpO2 Height Weight  09/19/18 0423 124/68 97.7 F (36.5 C) Oral (!) 59 14 100 % - -  09/18/18 2155 124/64 98.2 F (36.8 C) Oral (!) 58 15 100 % - -  09/18/18 1647 120/70 98.5 F (36.9 C) - 61 16 100 % - -  09/18/18 1516 132/66 - - 60 16 100 % - -  09/18/18 1413 (!) 144/81 - - 61 15 100 % - -  09/18/18 1314 138/73 (!) 97.5 F (36.4 C) Oral 62 16 99 % - -  09/18/18 1245 124/64 (!) 97.5 F (36.4 C) - (!) 59 14 100 % - -  09/18/18 1230 125/62 - - (!) 58 14 100 % - -  09/18/18 1215 125/64 - - (!) 58 17 100 % - -  09/18/18 1200 121/62 - - 67 16 100 % - -  09/18/18 1158 122/69 97.7 F (36.5 C) - 68 17 100 % - -  09/18/18 0928 - - - 60 17 99 % - -  09/18/18 0926 - - - (!) 57 16 99 % - -  09/18/18 0924 - - - (!) 55 15 100 % - -  09/18/18 0922 - - - 64 13 100 % - -  09/18/18 0920 - - - (!) 55 15 100 % - -  09/18/18 0918 - - - 64 15 100 % - -  09/18/18 0916 - - - (!) 57 15 100 % - -  09/18/18 0915 (!) 112/58 - - 63 15 100 % - -  09/18/18 0914 - - - 60 15 100 % - -  09/18/18 0912 - - - (!) 57 16 100 % - -  09/18/18 0910 - - - 62 15 100 % - -  09/18/18 0908 - - - 64 16 99 % - -  09/18/18 0906 - - - 64 14 99 % - -  09/18/18 0904 - - - 64 16 99 % - -  09/18/18 0902 - - - 66 15 98 % - -  09/18/18 0901 - - - 67 16 99 % - -  09/18/18 0900 (!) 118/59 - - 62 18 99 % - -  09/18/18 0859 - - - 65 18 99 % - -  09/18/18 0858 - - - 67 16 99 % - -  09/18/18 0857 - - - 67 17 99 % - -  09/18/18 0856 113/62 - - 67 19 99 % - -  09/18/18 0854 - - - 65 20 99 % - -  09/18/18 0852 - - - 62 14 100 % - -  09/18/18 0850 - - - 64 - 100 % - -  09/18/18 0848 - - - 68 - 98 % - -  09/18/18 0846 - - - 66 - 100 % - -  09/18/18 0845 - - - 70 15 100 % - -  09/18/18 0844 - - - (!) 59 - 100 % - -  09/18/18 0805 130/72 98.3 F (36.8 C) Oral 78 18 99 % - -  09/18/18  0759 - - - - - - 5\' 6"  (1.676 m) 87.1 kg     Recent laboratory studies:  Recent Labs    09/16/18 1403 09/19/18 0300  WBC 8.9 14.8*  HGB 12.6 10.9*  HCT 41.0 35.9*  PLT 260 247  NA 139  --   K 4.4  --   CL 106  --   CO2 26  --   BUN 18  --   CREATININE 0.81  --   GLUCOSE 92  --   INR 1.1  --   CALCIUM 9.7  --      Discharge Medications:   Allergies as of 09/19/2018   No Known Allergies     Medication List    STOP taking these medications   Aleve 220 MG tablet Generic drug: naproxen sodium   meloxicam 15 MG tablet Commonly known as: MOBIC     TAKE these medications   acetaminophen 500 MG tablet Commonly known as: TYLENOL Take 500 mg by mouth daily as needed for moderate pain.   aspirin EC 325 MG tablet Take 1 tablet (325 mg total) by mouth 2 (two) times daily after a meal. Take x 1 month post op to decrease risk of blood clots. What changed:   medication strength  how much to take  when to take this  additional instructions   conjugated estrogens vaginal cream Commonly known as: Premarin 1/2 gm vaginally twice weekly   cyanocobalamin 1000 MCG/ML injection Commonly known as: Cobal-1000 Inject 1 mL (1,000 mcg total) into the muscle every 30 (thirty) days. What changed:   when to take this  additional instructions   docusate sodium 100 MG capsule Commonly known as: Colace Take 1 capsule (100 mg total) by mouth 2 (two) times daily.   EYE [place] OP Place 1 Dose into both eyes  daily as needed (irritation).   LEG CRAMP RELIEF PO Take 1 tablet by mouth daily as needed (leg cramps).   oxyCODONE-acetaminophen 5-325 MG tablet Commonly known as: PERCOCET/ROXICET Take 1-2 tablets by mouth every 6 (six) hours as needed for severe pain.   tiZANidine 2 MG tablet Commonly known as: ZANAFLEX Take 1 tablet (2 mg total) by mouth every 8 (eight) hours as needed for muscle spasms.   Vitamin D 50 MCG (2000 UT) tablet Take 2,000 Units by mouth daily.        Diagnostic Studies: Dg Chest 2 View  Result Date: 09/17/2018 CLINICAL DATA:  68 year old female preoperative study for knee surgery. EXAM: CHEST - 2 VIEW COMPARISON:  None. FINDINGS: Normal lung volumes. Cardiac size at the upper limits of normal. Other mediastinal contours are within normal limits. However, there is asymmetric density of the left hilum on the PA view. This might be related to calcified lymph node(s), uncertain. Visualized tracheal air column is within normal limits. Questionable small calcified granuloma in the right upper lobe. Otherwise both lungs appear clear. No pneumothorax or pleural effusion. Negative visible bowel gas pattern. No acute osseous abnormality identified. IMPRESSION: 1. No comparisons. Asymmetric density of the left hilum of unclear significance. Recommend follow-up Chest CT (IV contrast preferred) to exclude mass or lymphadenopathy. 2. Otherwise no acute cardiopulmonary abnormality. These results will be called to the ordering clinician or representative by the Radiologist Assistant, and communication documented in the PACS or zVision Dashboard. Electronically Signed   By: Odessa FlemingH  Hall M.D.   On: 09/17/2018 08:11    Disposition: Discharge disposition: 01-Home or Self Care       Discharge Instructions    Call MD / Call 911   Complete by: As directed    If you experience chest pain or shortness of breath, CALL 911 and be transported to the hospital emergency room.  If you develope a fever above 101 F, pus (white drainage) or increased drainage or redness at the wound, or calf pain, call your surgeon's office.   Constipation Prevention   Complete by: As directed    Drink plenty of fluids.  Prune juice may be helpful.  You may use a stool softener, such as Colace (over the counter) 100 mg twice a day.  Use MiraLax (over the counter) for constipation as needed.   Diet - low sodium heart healthy   Complete by: As directed    Increase activity slowly as tolerated    Complete by: As directed       Follow-up Information    Jodi GeraldsGraves, John, MD. Schedule an appointment as soon as possible for a visit in 2 weeks.   Specialty: Orthopedic Surgery Contact information: [address] [place] [address] [phone]            Signed: Terance HartChristopher R Ailed Defibaugh 09/19/2018, 7:00 AM

## 2018-09-19 NOTE — Progress Notes (Signed)
Home health agencies that serve [phone].        [place] Quality of Patient Care Rating Patient Survey Summary Rating  [facility] [phone] 3 out of 5 stars 5 out of [place] [phone] 3 out of 5 stars 4 out of [place] [phone]  out of 5 stars 3 out of [place] [phone] 4 out of 5 stars 4 out of [place] [phone] 4 out of 5 stars 4 out of 5 stars  [facility] [phone] 3  out of 5 stars 4 out of [place] [phone] 3 out of 5 stars 4 out of 5 stars  [facility] ([phone]) [phone] 4 out of 5 stars [facility] [place] [phone] 3 out of 5 stars 4 out of 5 stars  [facility] [phone] 3  out of 5 stars 3 out of [place] [phone] 3  out of 5 stars 4 out of [place] [phone] 3  out of 5 stars 3 out of [place] [phone] 4  out of 5 stars 3 out of [place] [phone]  out of 5 stars 2 out of [place] number Footnote as displayed on [place]  1 This agency provides services under a federal waiver program to non-traditional, chronic long term population.  2 This agency provides services to a special needs population.  3 Not Available.  4 The number of patient episodes for this measure is too small to report.  5 This measure currently does not have data or provider has been certified/recertified for less than 6 months.  6 The national average for this measure is not provided because of state-to-state differences in data collection.  7 Medicare is not displaying rates for this measure  for any home health agency, because of an issue with the data.  8 There were problems with the data and they are being corrected.  9 Zero, or very few, patients met the survey's rules for inclusion. The scores shown, if any, reflect a very small number of surveys and may not accurately tell how an agency is doing.  10 Survey results are based on less than 12 months of data.  11 Fewer than 70 patients completed the survey. Use the scores shown, if any, with caution as the number of surveys may be too low to accurately tell how an agency is doing.  12 No survey results are available for this period.  13 Data suppressed by CMS for one or more quarters.

## 2018-09-19 NOTE — Progress Notes (Signed)
Physical Therapy Treatment Patient Details Name: Kristen Nielsen MRN: [id number] DOB: March 27, 1950 Today's Date: 09/19/2018    History of Present Illness s/p L TKA    PT Comments    Pt ambulated in hallway and practiced safe stair technique.  Pt also performed LE exercises and provided with HEP handout.  Pt feels ready for d/c home today.   Follow Up Recommendations  Follow surgeon's recommendation for DC plan and follow-up therapies;Home health PT     Equipment Recommendations  Other (comment)(delivered to room)    Recommendations for Other Services       Precautions / Restrictions Precautions Precautions: Fall;Knee Restrictions Weight Bearing Restrictions: No Other Position/Activity Restrictions: WBAT    Mobility  Bed Mobility               General bed mobility comments: pt up in recliner on arrival  Transfers Overall transfer level: Needs assistance Equipment used: Rolling walker (2 wheeled) Transfers: Sit to/from Stand Sit to Stand: Min guard         General transfer comment: verbal cues for UE and LE positioning  Ambulation/Gait Ambulation/Gait assistance: Min guard Gait Distance (Feet): 160 Feet Assistive device: Rolling walker (2 wheeled) Gait Pattern/deviations: Step-through pattern;Step-to pattern;Decreased stance time - left;Antalgic     General Gait Details: cues for sequence, RW positioning, step length   Stairs Stairs: Yes Stairs assistance: Min guard Stair Management: Step to pattern;Forwards;Two rails Number of Stairs: 3 General stair comments: verbal cues for sequence, safety; pt performed twice and reports understanding   Wheelchair Mobility    Modified Rankin (Stroke Patients Only)       Balance                                            Cognition Arousal/Alertness: Awake/alert Behavior During Therapy: WFL for tasks assessed/performed Overall Cognitive Status: Within Functional Limits for tasks  assessed                                        Exercises Total Joint Exercises Ankle Circles/Pumps: AROM;10 reps;Both Quad Sets: Both;AROM;10 reps Short Arc Quad: AROM;Left;10 reps Heel Slides: 10 reps;Left;AAROM;Seated Hip ABduction/ADduction: AROM;Left;10 reps    General Comments        Pertinent Vitals/Pain Pain Assessment: 0-10 Pain Score: 4  Pain Location: left thigh and knee Pain Descriptors / Indicators: Grimacing;Sore Pain Intervention(s): Repositioned;Premedicated before session;Monitored during session;Ice applied    Home Living                      Prior Function            PT Goals (current goals can now be found in the care plan section) Progress towards PT goals: Progressing toward goals    Frequency    7X/week      PT Plan Current plan remains appropriate    Co-evaluation              AM-PAC PT "6 Clicks" Mobility   Outcome Measure  Help needed turning from your back to your side while in a flat bed without using bedrails?: A Little Help needed moving from lying on your back to sitting on the side of a flat bed without using bedrails?: A Little Help needed moving to and from  a bed to a chair (including a wheelchair)?: A Little Help needed standing up from a chair using your arms (e.g., wheelchair or bedside chair)?: A Little Help needed to walk in hospital room?: A Little Help needed climbing 3-5 steps with a railing? : A Little 6 Click Score: 18    End of Session Equipment Utilized During Treatment: Gait belt Activity Tolerance: Patient tolerated treatment well Patient left: in chair;with call bell/phone within reach;with chair alarm set   PT Visit Diagnosis: Difficulty in walking, not elsewhere classified (R26.2)     Time: 1030-1046 PT Time Calculation (min) (ACUTE ONLY): 16 min  Charges:  $Gait Training: 8-22 mins                     Carmelia Bake, PT, [place] Office:  [phone] Pager: [place] 09/19/2018, 12:38 PM

## 2018-09-19 NOTE — TOC Initial Note (Signed)
Transition of Care [facility]) - Initial/Assessment Note    Patient Details  Name: Kristen Nielsen MRN: [id number] Date of Birth: [birth year]  Transition of Care (TOC) CM/SW Contact:    Joaquin Courts, RN Phone Number: 09/19/2018, 11:09 AM  Clinical Narrative:                 CM spoke with patient at bedside. [facility], reports has a rolling walker and 3-in-1 at home.  Expected Discharge Plan: [place] Barriers to Discharge: No Barriers Identified   Patient Goals and CMS Choice Patient states their goals for this hospitalization and ongoing recovery are:: to get better CMS Medicare.gov Compare Post Acute Care list provided to:: Patient Choice offered to / list presented to : Patient  Expected Discharge Plan and Services Expected Discharge Plan: [place]   Discharge Planning Services: CM Consult Post Acute Care Choice: Home Health   Expected Discharge Date: 09/19/18               DME Arranged: N/A DME Agency: NA       HH Arranged: PT HH Agency: [facility] ([place]) Date HH Agency Contacted: 09/19/18 Time HH Agency Contacted: 65 Representative spoke with at [place]: Corene Cornea  Prior Living Arrangements/Services   Lives with:: Adult Children Patient language and need for interpreter reviewed:: Yes Do you feel safe going back to the place where you live?: Yes      Need for Family Participation in Patient Care: Yes (Comment) Care giver support system in place?: Yes (comment)   Criminal Activity/Legal Involvement Pertinent to Current Situation/Hospitalization: No - Comment as needed  Activities of Daily Living Home Assistive Devices/Equipment: None ADL Screening (condition at time of admission) Patient's cognitive ability adequate to safely complete daily activities?: Yes Is the patient deaf or have difficulty hearing?: No Does the patient have difficulty seeing, even  when wearing glasses/contacts?: No Does the patient have difficulty concentrating, remembering, or making decisions?: No Patient able to express need for assistance with ADLs?: Yes Does the patient have difficulty dressing or bathing?: No Independently performs ADLs?: Yes (appropriate for developmental age) Does the patient have difficulty walking or climbing stairs?: No Weakness of Legs: None Weakness of Arms/Hands: None  Permission Sought/Granted   Permission granted to share information with : Yes, Release of Information Signed              Emotional Assessment Appearance:: Appears stated age Attitude/Demeanor/Rapport: Engaged Affect (typically observed): Accepting Orientation: : Oriented to Self, Oriented to Place, Oriented to  Time, Oriented to Situation   Psych Involvement: No (comment)  Admission diagnosis:  LEFT KNEE OSTEOARTHRITIS Patient Active Problem List   Diagnosis Date Noted  . Primary osteoarthritis of left knee 09/18/2018  . Preop exam for internal medicine 09/16/2018  . Family history of colon cancer 07/30/2016  . Knee pain, left 07/30/2016  . Seasonal allergies 09/29/2013  . Well adult 04/19/2013  . Pernicious anemia 01/28/2011   PCP:  Cassandria Anger, MD Pharmacy:   [id number] [address], [place] [address] Phone: [phone] Fax: [phone]     Social Determinants of Health (SDOH) Interventions    Readmission Risk Interventions No flowsheet data found.

## 2018-09-21 ENCOUNTER — Encounter (HOSPITAL_COMMUNITY): Payer: Self-pay | Admitting: Orthopedic Surgery

## 2018-09-21 ENCOUNTER — Telehealth: Payer: Self-pay

## 2018-09-21 NOTE — Telephone Encounter (Signed)
Pt is on TCM list after dc for total knee arthroplasty on 09/18/2018. Pt to follow up with Ortho in 2 weeks.

## 2018-10-06 ENCOUNTER — Other Ambulatory Visit: Payer: Self-pay

## 2018-10-06 ENCOUNTER — Inpatient Hospital Stay: Payer: Medicare Other | Attending: Oncology

## 2018-10-06 ENCOUNTER — Inpatient Hospital Stay: Payer: Medicare Other

## 2018-10-06 VITALS — BP 135/71 | HR 60 | Temp 98.0°F

## 2018-10-06 DIAGNOSIS — D510 Vitamin B12 deficiency anemia due to intrinsic factor deficiency: Secondary | ICD-10-CM | POA: Diagnosis not present

## 2018-10-06 MED ORDER — CYANOCOBALAMIN 1000 MCG/ML IJ SOLN
1000.0000 ug | Freq: Once | INTRAMUSCULAR | Status: AC
  Administered 2018-10-06: 14:00:00 1000 ug via INTRAMUSCULAR

## 2018-10-06 MED ORDER — CYANOCOBALAMIN 1000 MCG/ML IJ SOLN
INTRAMUSCULAR | Status: AC
  Filled 2018-10-06: qty 1

## 2018-10-06 NOTE — Patient Instructions (Signed)
Cyanocobalamin, Pyridoxine, and Folate What is this medicine? A multivitamin containing folic acid, vitamin B6, and vitamin B12. This medicine may be used for other purposes; ask your health care provider or pharmacist if you have questions. COMMON BRAND NAME(S): AllanFol RX, AllanTex, Av-Vite FB, B Complex with Folic Acid, ComBgen, FaBB, Folamin, Folastin, Folbalin, Folbee, Folbic, Folcaps, Folgard, Folgard RX, Folgard RX 2.2, Folplex, Folplex 2.2, Foltabs 800, Foltx, Homocysteine Formula, Niva-Fol, NuFol, TL Gard RX, Virt-Gard, Virt-Vite, Virt-Vite Forte, Vita-Respa What should I tell my health care provider before I take this medicine? They need to know if you have any of these conditions:  bleeding or clotting disorder  history of anemia of any type  other chronic health condition  an unusual or allergic reaction to vitamins, other medicines, foods, dyes, or preservatives  pregnant or trying to get pregnant  breast-feeding How should I use this medicine? Take by mouth with a glass of water. May take with food. Follow the directions on the prescription label. It is usually given once a day. Do not take your medicine more often than directed. Contact your pediatrician regarding the use of this medicine in children. Special care may be needed. Overdosage: If you think you have taken too much of this medicine contact a poison control center or emergency room at once. NOTE: This medicine is only for you. Do not share this medicine with others. What if I miss a dose? If you miss a dose, take it as soon as you can. If it is almost time for your next dose, take only that dose. Do not take double or extra doses. What may interact with this medicine?  levodopa This list may not describe all possible interactions. Give your health care provider a list of all the medicines, herbs, non-prescription drugs, or dietary supplements you use. Also tell them if you smoke, drink alcohol, or use illegal  drugs. Some items may interact with your medicine. What should I watch for while using this medicine? See your health care professional for regular checks on your progress. Remember that vitamin supplements do not replace the need for good nutrition from a balanced diet. What side effects may I notice from receiving this medicine? Side effects that you should report to your doctor or health care professional as soon as possible:  allergic reaction such as skin rash or difficulty breathing  vomiting Side effects that usually do not require medical attention (report to your doctor or health care professional if they continue or are bothersome):  nausea  stomach upset This list may not describe all possible side effects. Call your doctor for medical advice about side effects. You may report side effects to FDA at 1-800-FDA-1088. Where should I keep my medicine? Keep out of the reach of children. Most vitamins should be stored at controlled room temperature. Check your specific product directions. Protect from heat and moisture. Throw away any unused medicine after the expiration date. NOTE: This sheet is a summary. It may not cover all possible information. If you have questions about this medicine, talk to your doctor, pharmacist, or health care provider.  2020 Elsevier/Gold Standard (2007-05-02 00:59:55)  

## 2018-10-13 ENCOUNTER — Other Ambulatory Visit: Payer: Self-pay | Admitting: Orthopedic Surgery

## 2018-10-13 DIAGNOSIS — R9389 Abnormal findings on diagnostic imaging of other specified body structures: Secondary | ICD-10-CM

## 2018-10-21 ENCOUNTER — Ambulatory Visit
Admission: RE | Admit: 2018-10-21 | Discharge: 2018-10-21 | Disposition: A | Discharge status: Home / Self Care | Payer: Medicare Other | Source: Ambulatory Visit | Attending: Orthopedic Surgery | Admitting: Orthopedic Surgery

## 2018-10-21 DIAGNOSIS — R9389 Abnormal findings on diagnostic imaging of other specified body structures: Secondary | ICD-10-CM

## 2018-10-21 MED ORDER — IOPAMIDOL (ISOVUE-300) INJECTION 61%
75.0000 mL | Freq: Once | INTRAVENOUS | Status: AC | PRN
  Administered 2018-10-21: 75 mL via INTRAVENOUS

## 2018-10-29 ENCOUNTER — Ambulatory Visit: Payer: Medicare Other | Admitting: Certified Nurse Midwife

## 2018-11-02 ENCOUNTER — Ambulatory Visit: Payer: Medicare Other | Admitting: Certified Nurse Midwife

## 2018-11-20 ENCOUNTER — Other Ambulatory Visit: Payer: Self-pay | Admitting: Certified Nurse Midwife

## 2018-11-20 DIAGNOSIS — N952 Postmenopausal atrophic vaginitis: Secondary | ICD-10-CM

## 2018-11-20 NOTE — Telephone Encounter (Signed)
Medication refill request: premarin vag cream  Last AEX:  10/28/17 DL Next AEX: 12/18/18 DL Last MMG (if hormonal medication request): 12/04/17 [id number]  Refill authorized: 10/30/17 #30g/3R. Today please advise

## 2018-11-30 NOTE — Progress Notes (Signed)
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 She started to receive her B12 shots here since she did not have a primary care physician. She has established herself in Dr. Loren Racer office, but still gets her injection here at her request   INTERVAL HISTORY: [name] returns today for follow-up and treatment of her pernicious anemia. She was last seen here on 11/04/2017.   She continues on Vitamin B-12 supplementation.  We changed that to every 2 months sometime back and she feels perhaps she was feeling with more energy when she received it monthly.  Still she is not sure that she wants to go back to every month simply because of the inconvenience.  Since her last visit here she had knee surgery under Dr. Luiz Blare.  She generally did well but has had a very hard time with the rehab.  She feels she just cannot bend that knee sufficiently.  She is trying amitriptyline and diclofenac at present.  Since her last visit here, she underwent a digital screening bilateral mammogram with tomography on 12/04/2017 showing: Breast Density Category B. There is no mammographic evidence of malignancy. She will be due for repeat mammography later this month.  She also underwent a chest xray on 09/16/2018 showing: No comparisons. Asymmetric density of the left hilum of unclear significance. Recommend follow-up Chest CT (IV contrast preferred) to exclude mass or lymphadenopathy. Otherwise no acute cardiopulmonary abnormality.  She then underwent a chest CT on 10/21/2018 showing: No suspicious abnormality in the left lung at the site of the previously questioned finding on chest radiograph. This appears to represent a confluence of left hilar  vessels.   REVIEW OF SYSTEMS: [name] is limited in what she can do because of her recent surgery.  Also her hemoglobin had dropped to less than 11.  We are going to be repeating that today.  She is keeping appropriate pandemic precautions.  Aside from these issues a detailed review of systems today was stable   PAST MEDICAL HISTORY: Past Medical History:  Diagnosis Date   Anemia    Arthritis    Environmental allergies    Vitamin B 12 deficiency     PAST SURGICAL HISTORY: Past Surgical History:  Procedure Laterality Date   TONSILLECTOMY AND ADENOIDECTOMY     TOTAL KNEE ARTHROPLASTY Left 09/18/2018   Procedure: TOTAL KNEE ARTHROPLASTY;  Surgeon: Jodi Geralds, MD;  Location: [facility];  Service: Orthopedics;  Laterality: Left;    FAMILY HISTORY As of July of 2014, her parents are still alive. Her father has a history of prostate cancer. The patient has one sister, who was diagnosed with breast cancer the age of 67. She is now doing well. She has one brother who died from colon cancer in 2015-07-22.  GYNECOLOGIC HISTORY: GX P2, first live birth age 72. Change of life age 46.   SOCIAL HISTORY: (Updated September 2020). She is a retired Freight forwarder. She is divorced and lives by herself, although her 71 year old grandson who works for [employer] is currently living with her.. Her daughter lives in [place], [place], where the patient's son-in-law works as Heritage manager; the patient's son lives here in town. Earleen has 4 grandchildren and attends a [employer].  ADVANCED DIRECTIVES: Not in place  HEALTH MAINTENANCE: Social History   Tobacco Use   Smoking status: Never Smoker   Smokeless tobacco: Never Used  Substance Use Topics   Alcohol use: Yes    Alcohol/week: 0.0 - 2.0 standard drinks    Comment: occ   Drug use: No     Colonoscopy: Buccini  PAP: [place]ax  Bone density: 2007/ normal  Lipid panel: [place]  No Known Allergies  Current Outpatient Medications    Medication Sig Dispense Refill   acetaminophen (TYLENOL) 500 MG tablet Take 500 mg by mouth daily as needed for moderate pain.     aspirin EC 325 MG tablet Take 1 tablet (325 mg total) by mouth 2 (two) times daily after a meal. Take x 1 month post op to decrease risk of blood clots. 60 tablet 0   Cholecalciferol (VITAMIN D) 50 MCG (2000 UT) tablet Take 2,000 Units by mouth daily.     conjugated estrogens (PREMARIN) vaginal cream 1/2 GM VAGINALLY TWICE WEEKLY 30 g 0   cyanocobalamin (COBAL-1000) 1000 MCG/ML injection Inject 1 mL (1,000 mcg total) into the muscle every 30 (thirty) days. (Patient taking differently: Inject 1,000 mcg into the muscle See admin instructions. Every other month) 10 mL 6   docusate sodium (COLACE) 100 MG capsule Take 1 capsule (100 mg total) by mouth 2 (two) times daily. 30 capsule 0   Homeopathic Products (LEG CRAMP RELIEF PO) Take 1 tablet by mouth daily as needed (leg cramps).      Ophthalmic Irrigation Solution (EYE [place] OP) Place 1 Dose into both eyes daily as needed (irritation).     No current facility-administered medications for this visit.     OBJECTIVE: Middle-aged African American woman in no acute distress  Vitals:   12/01/18 1410  BP: (!) 162/66  Pulse: 64  Resp: 18  Temp: 99.1 F (37.3 C)  SpO2: 100%   Wt Readings from Last 3 Encounters:  12/01/18 188 lb 9.6 oz (85.5 kg)  09/18/18 192 lb (87.1 kg)  09/16/18 192 lb (87.1 kg)   Body mass index is 30.44 kg/m.    ECOG FS:1 - Symptomatic but completely ambulatory  Ocular: Sclerae unicteric, pupils round and equal Ear-nose-throat: Wearing a mask Lymphatic: No cervical or supraclavicular adenopathy Lungs no rales or rhonchi Heart regular rate and rhythm Abd soft, nontender, positive bowel sounds MSK no focal spinal tenderness, knee incision appears to have healed appropriately Neuro: non-focal, well-oriented, appropriate affect Breasts: Deferred    LAB RESULTS: Lab Results   Component Value Date   WBC 14.8 (H) 09/19/2018   NEUTROABS 6.0 09/16/2018   HGB 10.9 (L) 09/19/2018   HCT 35.9 (L) 09/19/2018   MCV 86.5 09/19/2018   PLT 247 09/19/2018      Chemistry      Component Value Date/Time   NA 139 09/16/2018 1403   NA 142 10/03/2014 1046      Component Value Date/Time   CALCIUM 9.7 09/16/2018 1403   CALCIUM 9.9 10/03/2014 1046       No results found for: LABCA2  No components found for: DVVOH607  No results for input(s): INR in the last 168 hours.  Urinalysis    Component Value Date/Time   COLORURINE STRAW (A) 09/16/2018 1403    STUDIES: No results found.   ASSESSMENT: 68 y.o.  woman with history of pernicious anemia diagnosed in 1996 with a positive intrinsic factor for blocking antibody.  Receives B12 injections on a bi-monthly basis, due again today.  PLAN:  Riley Lamunice had a drop in her hemoglobin likely because of the recent surgery.  We will repeat that today.  Should have corrected by now.  I am not sure that her receiving B12 every other month is responsible for her fatigue.  That is a very common symptom at her age especially she cannot exercise regularly.  Nevertheless if she wishes to go back to monthly we can do that.  At this point she prefers to continue as we have been doing.  She will have a CBC today and we will proceed with B12 today and every 8 weeks as before  She will see me again in a year.  She knows to call for any other issue that may develop before the next visit.  Keslyn Teater, Valentino HueGustav C, MD  12/01/18 2:44 PM Medical Oncology and Hematology [facility] [address] [place], [place] [address] Tel. [phone]    Fax. [phone]  I, Mal MistyAmber Handy am acting as a Neurosurgeonscribe for Lowella DellGustav C, Maisley Hainsworth, MD.   I, Ruthann CancerGustav Sharanda Shinault MD, have reviewed the above documentation for accuracy and completeness, and I agree with the above.

## 2018-12-01 ENCOUNTER — Inpatient Hospital Stay: Payer: Medicare Other

## 2018-12-01 ENCOUNTER — Other Ambulatory Visit: Payer: Self-pay

## 2018-12-01 ENCOUNTER — Inpatient Hospital Stay: Payer: Medicare Other | Attending: Oncology | Admitting: Oncology

## 2018-12-01 VITALS — BP 162/66 | HR 64 | Temp 99.1°F | Resp 18 | Ht 66.0 in | Wt 188.6 lb

## 2018-12-01 DIAGNOSIS — Z803 Family history of malignant neoplasm of breast: Secondary | ICD-10-CM | POA: Diagnosis not present

## 2018-12-01 DIAGNOSIS — Z7982 Long term (current) use of aspirin: Secondary | ICD-10-CM | POA: Diagnosis not present

## 2018-12-01 DIAGNOSIS — D510 Vitamin B12 deficiency anemia due to intrinsic factor deficiency: Secondary | ICD-10-CM | POA: Diagnosis not present

## 2018-12-01 LAB — CBC WITH DIFFERENTIAL/PLATELET
Abs Immature Granulocytes: 0.02 10*3/uL (ref 0.00–0.07)
Basophils Absolute: 0 10*3/uL (ref 0.0–0.1)
Basophils Relative: 0 %
Eosinophils Absolute: 0.4 10*3/uL (ref 0.0–0.5)
Eosinophils Relative: 6 %
HCT: 39.6 % (ref 36.0–46.0)
Hemoglobin: 12.3 g/dL (ref 12.0–15.0)
Immature Granulocytes: 0 %
Lymphocytes Relative: 18 %
Lymphs Abs: 1.4 10*3/uL (ref 0.7–4.0)
MCH: 26.3 pg (ref 26.0–34.0)
MCHC: 31.1 g/dL (ref 30.0–36.0)
MCV: 84.6 fL (ref 80.0–100.0)
Monocytes Absolute: 0.8 10*3/uL (ref 0.1–1.0)
Monocytes Relative: 11 %
Neutro Abs: 5.1 10*3/uL (ref 1.7–7.7)
Neutrophils Relative %: 65 %
Platelets: 268 10*3/uL (ref 150–400)
RBC: 4.68 MIL/uL (ref 3.87–5.11)
RDW: 13.5 % (ref 11.5–15.5)
WBC: 7.8 10*3/uL (ref 4.0–10.5)
nRBC: 0 % (ref 0.0–0.2)

## 2018-12-01 MED ORDER — CYANOCOBALAMIN 1000 MCG/ML IJ SOLN
1000.0000 ug | Freq: Once | INTRAMUSCULAR | Status: AC
  Administered 2018-12-01: 15:00:00 1000 ug via INTRAMUSCULAR

## 2018-12-01 MED ORDER — CYANOCOBALAMIN 1000 MCG/ML IJ SOLN
INTRAMUSCULAR | Status: AC
  Filled 2018-12-01: qty 1

## 2018-12-01 NOTE — Patient Instructions (Signed)

## 2018-12-02 ENCOUNTER — Telehealth: Payer: Self-pay | Admitting: Oncology

## 2018-12-02 NOTE — Telephone Encounter (Signed)
I talk with patient regarding schedule  

## 2018-12-16 ENCOUNTER — Other Ambulatory Visit: Payer: Self-pay

## 2018-12-18 ENCOUNTER — Encounter: Payer: Self-pay | Admitting: Certified Nurse Midwife

## 2018-12-18 ENCOUNTER — Other Ambulatory Visit: Payer: Self-pay

## 2018-12-18 ENCOUNTER — Ambulatory Visit (INDEPENDENT_AMBULATORY_CARE_PROVIDER_SITE_OTHER): Payer: Medicare Other | Admitting: Certified Nurse Midwife

## 2018-12-18 VITALS — BP 130/80 | HR 68 | Temp 97.0°F | Resp 16 | Ht 64.75 in | Wt 187.0 lb

## 2018-12-18 DIAGNOSIS — N952 Postmenopausal atrophic vaginitis: Secondary | ICD-10-CM

## 2018-12-18 DIAGNOSIS — Z01419 Encounter for gynecological examination (general) (routine) without abnormal findings: Secondary | ICD-10-CM

## 2018-12-18 DIAGNOSIS — Z780 Asymptomatic menopausal state: Secondary | ICD-10-CM

## 2018-12-18 NOTE — Progress Notes (Signed)
68 y.o. [id number] Divorced  African American Fe here for annual exam. Post menopausal no vaginal dryness or bleeding. Recent surgery on left knee,  replacement recovering well with cane assistance. Denies vaginal bleeding. Using Premarin cream to help with bladder issues,and dryness. Working well. Aware she needs mammogram to continue. Sees PCP for aex, labs, BMD. All normal at last visit per patient. No health issues today.  Patient's last menstrual period was 03/25/2008.          Sexually active: No.  The current method of family planning is post menopausal status.    Exercising: Yes.    physical therapy Smoker:  no  Review of Systems  Constitutional: Negative.   HENT: Negative.   Eyes: Negative.   Respiratory: Negative.   Cardiovascular: Negative.   Gastrointestinal: Negative.   Genitourinary: Negative.   Musculoskeletal: Negative.   Skin: Negative.   Neurological: Negative.   Endo/Heme/Allergies: Negative.   Psychiatric/Behavioral: Negative.     Health Maintenance: Pap:  10-08-15 neg, 10-28-17 neg HPV HR neg History of Abnormal Pap: no MMG:  12-04-17 category b density birads 1:neg Self Breast exams: yes Colonoscopy:  2019 f/u 74yrs BMD:   2018 due has with PCP. TDaP:  2015 Shingles: 2019 Pneumonia: 2019 Hep C and HIV: HIV done for insurance Labs: yes with PCP   reports that she has never smoked. She has never used smokeless tobacco. She reports current alcohol use. She reports that she does not use drugs.  Past Medical History:  Diagnosis Date  . Anemia   . Arthritis   . Environmental allergies   . Vitamin B 12 deficiency     Past Surgical History:  Procedure Laterality Date  . TONSILLECTOMY AND ADENOIDECTOMY    . TOTAL KNEE ARTHROPLASTY Left 09/18/2018   Procedure: TOTAL KNEE ARTHROPLASTY;  Surgeon: Jodi Geralds, MD;  Location: [facility];  Service: Orthopedics;  Laterality: Left;    Current Outpatient Medications  Medication Sig Dispense Refill  . acetaminophen  (TYLENOL) 500 MG tablet Take 500 mg by mouth daily as needed for moderate pain.    Marland Kitchen aspirin EC 325 MG tablet Take 1 tablet (325 mg total) by mouth 2 (two) times daily after a meal. Take x 1 month post op to decrease risk of blood clots. 60 tablet 0  . Cholecalciferol (VITAMIN D) 50 MCG (2000 UT) tablet Take 2,000 Units by mouth daily.    Marland Kitchen conjugated estrogens (PREMARIN) vaginal cream 1/2 GM VAGINALLY TWICE WEEKLY 30 g 0  . cyanocobalamin (COBAL-1000) 1000 MCG/ML injection Inject 1 mL (1,000 mcg total) into the muscle every 30 (thirty) days. (Patient taking differently: Inject 1,000 mcg into the muscle See admin instructions. Every other month) 10 mL 6  . docusate sodium (COLACE) 100 MG capsule Take 1 capsule (100 mg total) by mouth 2 (two) times daily. 30 capsule 0  . Homeopathic Products (LEG CRAMP RELIEF PO) Take 1 tablet by mouth daily as needed (leg cramps).     Marland Kitchen Ophthalmic Irrigation Solution (EYE W[place] OP) Place 1 Dose into both eyes daily as needed (irritation).     No current facility-administered medications for this visit.     Family History  Problem Relation Age of Onset  . Hypertension Mother   . Cancer Father        lung ca  . Hypertension Father   . Diabetes Father   . Thyroid disease Father   . Breast cancer Sister 46  . Breast cancer Paternal Aunt 20  . Cancer  Brother 64       col ca    ROS:  Pertinent items are noted in HPI.  Otherwise, a comprehensive ROS was negative.  Exam:   LMP 03/25/2008    Ht Readings from Last 3 Encounters:  12/01/18 5\' 6"  (1.676 m)  09/18/18 5\' 6"  (1.676 m)  09/16/18 5\' 6"  (1.676 m)    General appearance: alert, cooperative and appears stated age Head: Normocephalic, without obvious abnormality, atraumatic Neck: no adenopathy, supple, symmetrical, trachea midline and thyroid normal to inspection and palpation Lungs: clear to auscultation bilaterally Breasts: normal appearance, no masses or tenderness, No nipple retraction or  dimpling, No nipple discharge or bleeding, No axillary or supraclavicular adenopathy Heart: regular rate and rhythm Abdomen: soft, non-tender; no masses,  no organomegaly Extremities: extremities normal, atraumatic, no cyanosis or edema Skin: Skin color, texture, turgor normal. No rashes or lesions Lymph nodes: Cervical, supraclavicular, and axillary nodes normal. No abnormal inguinal nodes palpated Neurologic: Grossly normal   Pelvic: External genitalia:  no lesions              Urethra:  normal appearing urethra with no masses, tenderness or lesions              Bartholin's and Skene's: normal                 Vagina: normal appearing vagina with normal color and discharge, no lesions              Cervix: no cervical motion tenderness and normal appearance              Pap taken: No. Bimanual Exam:  Uterus:  normal size, contour, position, consistency, mobility, non-tender and retroverted              Adnexa: normal adnexa and no mass, fullness, tenderness               Rectovaginal: Confirms               Anus:  normal sphincter tone, no lesions  Chaperone present: yes  A:  Well Woman with normal exam  Post menopausal with atrophic vaginitis, using Premarin cream with good results  Recent left knee replacement, in therapy  Mammogram due, BMD due(PCP)  P:   Reviewed health and wellness pertinent to exam  Aware of need to advise if vaginal bleeding.  Discussed will need mammogram to refill Premarin. Risks/benefits/warning signs given.  Continue follow up as indicated.  Patient to schedule.   Pap smear: no   counseled on breast self exam, mammography screening, feminine hygiene, adequate intake of calcium and vitamin D, diet and exercise, Kegel's exercises  return annually or prn  An After Visit Summary was printed and given to the patient.

## 2018-12-18 NOTE — Patient Instructions (Signed)
EXERCISE AND DIET:  We recommended that you start or continue a regular exercise program for good health. Regular exercise means any activity that makes your heart beat faster and makes you sweat.  We recommend exercising at least 30 minutes per day at least 3 days a week, preferably 4 or 5.  We also recommend a diet low in fat and sugar.  Inactivity, poor dietary choices and obesity can cause diabetes, heart attack, stroke, and kidney damage, among others.    ALCOHOL AND SMOKING:  Women should limit their alcohol intake to no more than 7 drinks/beers/glasses of wine (combined, not each!) per week. Moderation of alcohol intake to this level decreases your risk of breast cancer and liver damage. And of course, no recreational drugs are part of a healthy lifestyle.  And absolutely no smoking or even second hand smoke. Most people know smoking can cause heart and lung diseases, but did you know it also contributes to weakening of your bones? Aging of your skin?  Yellowing of your teeth and nails?  CALCIUM AND VITAMIN D:  Adequate intake of calcium and Vitamin D are recommended.  The recommendations for exact amounts of these supplements seem to change often, but generally speaking 600 mg of calcium (either carbonate or citrate) and 800 units of Vitamin D per day seems prudent. Certain women may benefit from higher intake of Vitamin D.  If you are among these women, your doctor will have told you during your visit.    PAP SMEARS:  Pap smears, to check for cervical cancer or precancers,  have traditionally been done yearly, although recent scientific advances have shown that most women can have pap smears less often.  However, every woman still should have a physical exam from her gynecologist every year. It will include a breast check, inspection of the vulva and vagina to check for abnormal growths or skin changes, a visual exam of the cervix, and then an exam to evaluate the size and shape of the uterus and  ovaries.  And after 67 years of age, a rectal exam is indicated to check for rectal cancers. We will also provide age appropriate advice regarding health maintenance, like when you should have certain vaccines, screening for sexually transmitted diseases, bone density testing, colonoscopy, mammograms, etc.   MAMMOGRAMS:  All women over 40 years old should have a yearly mammogram. Many facilities now offer a "3D" mammogram, which may cost around $50 extra out of pocket. If possible,  we recommend you accept the option to have the 3D mammogram performed.  It both reduces the number of women who will be called back for extra views which then turn out to be normal, and it is better than the routine mammogram at detecting truly abnormal areas.    COLONOSCOPY:  Colonoscopy to screen for colon cancer is recommended for all women at age 50.  We know, you hate the idea of the prep.  We agree, BUT, having colon cancer and not knowing it is worse!!  Colon cancer so often starts as a polyp that can be seen and removed at colonscopy, which can quite literally save your life!  And if your first colonoscopy is normal and you have no family history of colon cancer, most women don't have to have it again for 10 years.  Once every ten years, you can do something that may end up saving your life, right?  We will be happy to help you get it scheduled when you are ready.    Be sure to check your insurance coverage so you understand how much it will cost.  It may be covered as a preventative service at no cost, but you should check your particular policy.     Conjugated Estrogens vaginal cream What is this medicine? CONJUGATED ESTROGENS (CON ju gate ed ESS troe jenz) are a mixture of female hormones. This cream can help relieve symptoms associated with menopause.like vaginal dryness and irritation. This medicine may be used for other purposes; ask your health care provider or pharmacist if you have questions. COMMON BRAND  NAME(S): Premarin What should I tell my health care provider before I take this medicine? They need to know if you have any of these conditions:  abnormal vaginal bleeding  blood vessel disease or blood clots  breast, cervical, endometrial, or uterine cancer  dementia  diabetes  gallbladder disease  heart disease or recent heart attack  high blood pressure  high cholesterol  high level of calcium in the blood  hysterectomy  kidney disease  liver disease  migraine headaches  protein C deficiency  protein S deficiency  stroke  systemic lupus erythematosus (SLE)  tobacco smoker  an unusual or allergic reaction to estrogens other medicines, foods, dyes, or preservatives  pregnant or trying to get pregnant  breast-feeding How should I use this medicine? This medicine is for use in the vagina only. Do not take by mouth. Follow the directions on the prescription label. Use at bedtime unless otherwise directed by your doctor or health care professional. Use the special applicator supplied with the cream. Wash hands before and after use. Fill the applicator with the cream and remove from the tube. Lie on your back, part and bend your knees. Insert the applicator into the vagina and push the plunger to expel the cream into the vagina. Wash the applicator with warm soapy water and rinse well. Use exactly as directed for the complete length of time prescribed. Do not stop using except on the advice of your doctor or health care professional. Talk to your pediatrician regarding the use of this medicine in children. Special care may be needed. A patient package insert for the product will be given with each prescription and refill. Read this sheet carefully each time. The sheet may change frequently. Overdosage: If you think you have taken too much of this medicine contact a poison control center or emergency room at once. NOTE: This medicine is only for you. Do not share this  medicine with others. What if I miss a dose? If you miss a dose, use it as soon as you can. If it is almost time for your next dose, use only that dose. Do not use double or extra doses. What may interact with this medicine? Do not take this medicine with any of the following medications:  aromatase inhibitors like aminoglutethimide, anastrozole, exemestane, letrozole, testolactone This medicine may also interact with the following medications:  barbiturates used for inducing sleep or treating seizures  carbamazepine  grapefruit juice  medicines for fungal infections like itraconazole and ketoconazole  raloxifene or tamoxifen  rifabutin  rifampin  rifapentine  ritonavir  some antibiotics used to treat infections  St. John's Wort  warfarin This list may not describe all possible interactions. Give your health care provider a list of all the medicines, herbs, non-prescription drugs, or dietary supplements you use. Also tell them if you smoke, drink alcohol, or use illegal drugs. Some items may interact with your medicine. What should I watch for   while using this medicine? Visit your health care professional for regular checks on your progress. You will need a regular breast and pelvic exam. You should also discuss the need for regular mammograms with your health care professional, and follow his or her guidelines. This medicine can make your body retain fluid, making your fingers, hands, or ankles swell. Your blood pressure can go up. Contact your doctor or health care professional if you feel you are retaining fluid. If you have any reason to think you are pregnant; stop taking this medicine at once and contact your doctor or health care professional. Tobacco smoking increases the risk of getting a blood clot or having a stroke, especially if you are more than 67 years old. You are strongly advised not to smoke. If you wear contact lenses and notice visual changes, or if the  lenses begin to feel uncomfortable, consult your eye care specialist. If you are going to have elective surgery, you may need to stop taking this medicine beforehand. Consult your health care professional for advice prior to scheduling the surgery. What side effects may I notice from receiving this medicine? Side effects that you should report to your doctor or health care professional as soon as possible:  allergic reactions like skin rash, itching or hives, swelling of the face, lips, or tongue  breast tissue changes or discharge  changes in vision  chest pain  confusion, trouble speaking or understanding  dark urine  general ill feeling or flu-like symptoms  light-colored stools  nausea, vomiting  pain, swelling, warmth in the leg  right upper belly pain  severe headaches  shortness of breath  sudden numbness or weakness of the face, arm or leg  trouble walking, dizziness, loss of balance or coordination  unusual vaginal bleeding  yellowing of the eyes or skin Side effects that usually do not require medical attention (report to your doctor or health care professional if they continue or are bothersome):  hair loss  increased hunger or thirst  increased urination  symptoms of vaginal infection like itching, irritation or unusual discharge  unusually weak or tired This list may not describe all possible side effects. Call your doctor for medical advice about side effects. You may report side effects to FDA at 1-800-FDA-1088. Where should I keep my medicine? Keep out of the reach of children. Store at room temperature between 15 and 30 degrees C (59 and 86 degrees F). Throw away any unused medicine after the expiration date. NOTE: This sheet is a summary. It may not cover all possible information. If you have questions about this medicine, talk to your doctor, pharmacist, or health care provider.  2020 Elsevier/Gold Standard (2010-06-13 09:20:36)  

## 2018-12-19 MED ORDER — PREMARIN 0.625 MG/GM VA CREA: TOPICAL_CREAM | VAGINAL | 2 refills | Status: DC

## 2018-12-19 NOTE — Addendum Note (Signed)
Addended by: Regina Eck on: 12/19/2018 11:44 AM   Modules accepted: Orders

## 2019-01-21 ENCOUNTER — Other Ambulatory Visit: Payer: Self-pay | Admitting: Certified Nurse Midwife

## 2019-01-21 DIAGNOSIS — N952 Postmenopausal atrophic vaginitis: Secondary | ICD-10-CM

## 2019-01-26 ENCOUNTER — Other Ambulatory Visit: Payer: Self-pay

## 2019-01-26 ENCOUNTER — Inpatient Hospital Stay: Payer: Medicare Other | Attending: Oncology

## 2019-01-26 VITALS — BP 140/67 | HR 60 | Temp 97.6°F | Resp 18

## 2019-01-26 DIAGNOSIS — D510 Vitamin B12 deficiency anemia due to intrinsic factor deficiency: Secondary | ICD-10-CM

## 2019-01-26 MED ORDER — CYANOCOBALAMIN 1000 MCG/ML IJ SOLN
INTRAMUSCULAR | Status: AC
  Filled 2019-01-26: qty 1

## 2019-01-26 MED ORDER — CYANOCOBALAMIN 1000 MCG/ML IJ SOLN
1000.0000 ug | Freq: Once | INTRAMUSCULAR | Status: AC
  Administered 2019-01-26: 1000 ug via INTRAMUSCULAR

## 2019-02-23 ENCOUNTER — Other Ambulatory Visit: Payer: Self-pay | Admitting: Certified Nurse Midwife

## 2019-02-23 ENCOUNTER — Other Ambulatory Visit: Payer: Self-pay | Admitting: Oncology

## 2019-02-23 DIAGNOSIS — Z1231 Encounter for screening mammogram for malignant neoplasm of breast: Secondary | ICD-10-CM

## 2019-03-23 ENCOUNTER — Other Ambulatory Visit: Payer: Self-pay

## 2019-03-23 ENCOUNTER — Inpatient Hospital Stay: Payer: Medicare Other | Attending: Oncology

## 2019-03-23 VITALS — BP 152/62 | HR 65 | Temp 98.2°F | Resp 18

## 2019-03-23 DIAGNOSIS — D510 Vitamin B12 deficiency anemia due to intrinsic factor deficiency: Secondary | ICD-10-CM

## 2019-03-23 MED ORDER — CYANOCOBALAMIN 1000 MCG/ML IJ SOLN
1000.0000 ug | Freq: Once | INTRAMUSCULAR | Status: AC
  Administered 2019-03-23: 13:00:00 1000 ug via INTRAMUSCULAR

## 2019-03-23 MED ORDER — CYANOCOBALAMIN 1000 MCG/ML IJ SOLN
INTRAMUSCULAR | Status: AC
  Filled 2019-03-23: qty 1

## 2019-03-23 NOTE — Patient Instructions (Signed)

## 2019-04-05 ENCOUNTER — Other Ambulatory Visit: Payer: Self-pay

## 2019-04-05 ENCOUNTER — Ambulatory Visit
Admission: RE | Admit: 2019-04-05 | Discharge: 2019-04-05 | Disposition: A | Discharge status: Home / Self Care | Payer: Medicare PPO | Source: Ambulatory Visit | Attending: Certified Nurse Midwife | Admitting: Certified Nurse Midwife

## 2019-04-05 DIAGNOSIS — Z1231 Encounter for screening mammogram for malignant neoplasm of breast: Secondary | ICD-10-CM

## 2019-05-18 ENCOUNTER — Other Ambulatory Visit: Payer: Self-pay

## 2019-05-18 ENCOUNTER — Inpatient Hospital Stay: Payer: Medicare PPO | Attending: Oncology

## 2019-05-18 VITALS — BP 134/61 | HR 62 | Temp 98.1°F | Resp 18

## 2019-05-18 DIAGNOSIS — D510 Vitamin B12 deficiency anemia due to intrinsic factor deficiency: Secondary | ICD-10-CM

## 2019-05-18 MED ORDER — CYANOCOBALAMIN 1000 MCG/ML IJ SOLN
INTRAMUSCULAR | Status: AC
  Filled 2019-05-18: qty 1

## 2019-05-18 MED ORDER — CYANOCOBALAMIN 1000 MCG/ML IJ SOLN
1000.0000 ug | Freq: Once | INTRAMUSCULAR | Status: AC
  Administered 2019-05-18: 14:00:00 1000 ug via INTRAMUSCULAR

## 2019-06-15 ENCOUNTER — Encounter: Payer: Self-pay | Admitting: Certified Nurse Midwife

## 2019-07-13 ENCOUNTER — Other Ambulatory Visit: Payer: Self-pay

## 2019-07-13 ENCOUNTER — Inpatient Hospital Stay: Payer: Medicare PPO | Attending: Oncology

## 2019-07-13 VITALS — BP 134/72 | HR 78 | Temp 98.2°F | Resp 18

## 2019-07-13 DIAGNOSIS — D510 Vitamin B12 deficiency anemia due to intrinsic factor deficiency: Secondary | ICD-10-CM | POA: Insufficient documentation

## 2019-07-13 MED ORDER — CYANOCOBALAMIN 1000 MCG/ML IJ SOLN
1000.0000 ug | Freq: Once | INTRAMUSCULAR | Status: AC
  Administered 2019-07-13: 1000 ug via INTRAMUSCULAR

## 2019-07-13 NOTE — Patient Instructions (Signed)

## 2019-09-07 ENCOUNTER — Other Ambulatory Visit: Payer: Self-pay

## 2019-09-07 ENCOUNTER — Inpatient Hospital Stay: Payer: Medicare PPO | Attending: Oncology

## 2019-09-07 VITALS — BP 132/72 | HR 72 | Temp 98.2°F | Resp 18

## 2019-09-07 DIAGNOSIS — D510 Vitamin B12 deficiency anemia due to intrinsic factor deficiency: Secondary | ICD-10-CM | POA: Diagnosis present

## 2019-09-07 MED ORDER — CYANOCOBALAMIN 1000 MCG/ML IJ SOLN
1000.0000 ug | Freq: Once | INTRAMUSCULAR | Status: AC
  Administered 2019-09-07: 1000 ug via INTRAMUSCULAR

## 2019-09-07 NOTE — Patient Instructions (Signed)

## 2019-11-02 ENCOUNTER — Inpatient Hospital Stay: Payer: Medicare PPO | Attending: Oncology

## 2019-11-02 ENCOUNTER — Other Ambulatory Visit: Payer: Self-pay

## 2019-11-02 VITALS — BP 134/67 | HR 50 | Temp 98.3°F | Resp 18

## 2019-11-02 DIAGNOSIS — D510 Vitamin B12 deficiency anemia due to intrinsic factor deficiency: Secondary | ICD-10-CM | POA: Diagnosis not present

## 2019-11-02 MED ORDER — CYANOCOBALAMIN 1000 MCG/ML IJ SOLN
1000.0000 ug | Freq: Once | INTRAMUSCULAR | Status: AC
  Administered 2019-11-02: 1000 ug via INTRAMUSCULAR

## 2019-11-02 MED ORDER — CYANOCOBALAMIN 1000 MCG/ML IJ SOLN
INTRAMUSCULAR | Status: AC
  Filled 2019-11-02: qty 1

## 2019-12-20 ENCOUNTER — Ambulatory Visit: Payer: Medicare Other | Admitting: Certified Nurse Midwife

## 2019-12-28 ENCOUNTER — Inpatient Hospital Stay (HOSPITAL_BASED_OUTPATIENT_CLINIC_OR_DEPARTMENT_OTHER): Payer: Medicare PPO | Admitting: Oncology

## 2019-12-28 ENCOUNTER — Inpatient Hospital Stay: Payer: Medicare PPO | Attending: Oncology

## 2019-12-28 ENCOUNTER — Inpatient Hospital Stay: Payer: Medicare PPO

## 2019-12-28 ENCOUNTER — Other Ambulatory Visit: Payer: Self-pay

## 2019-12-28 VITALS — BP 139/64 | HR 53 | Temp 97.5°F | Resp 18 | Ht 64.75 in | Wt 189.8 lb

## 2019-12-28 DIAGNOSIS — D510 Vitamin B12 deficiency anemia due to intrinsic factor deficiency: Secondary | ICD-10-CM

## 2019-12-28 LAB — CBC WITH DIFFERENTIAL/PLATELET
Abs Immature Granulocytes: 0.03 10*3/uL (ref 0.00–0.07)
Basophils Absolute: 0 10*3/uL (ref 0.0–0.1)
Basophils Relative: 0 %
Eosinophils Absolute: 0.4 10*3/uL (ref 0.0–0.5)
Eosinophils Relative: 5 %
HCT: 37.1 % (ref 36.0–46.0)
Hemoglobin: 11.6 g/dL — ABNORMAL LOW (ref 12.0–15.0)
Immature Granulocytes: 0 %
Lymphocytes Relative: 17 %
Lymphs Abs: 1.3 10*3/uL (ref 0.7–4.0)
MCH: 25.8 pg — ABNORMAL LOW (ref 26.0–34.0)
MCHC: 31.3 g/dL (ref 30.0–36.0)
MCV: 82.6 fL (ref 80.0–100.0)
Monocytes Absolute: 0.6 10*3/uL (ref 0.1–1.0)
Monocytes Relative: 8 %
Neutro Abs: 5.4 10*3/uL (ref 1.7–7.7)
Neutrophils Relative %: 70 %
Platelets: 251 10*3/uL (ref 150–400)
RBC: 4.49 MIL/uL (ref 3.87–5.11)
RDW: 13.6 % (ref 11.5–15.5)
WBC: 7.9 10*3/uL (ref 4.0–10.5)
nRBC: 0 % (ref 0.0–0.2)

## 2019-12-28 MED ORDER — CYANOCOBALAMIN 1000 MCG/ML IJ SOLN
1000.0000 ug | Freq: Once | INTRAMUSCULAR | Status: AC
  Administered 2019-12-28: 1000 ug via INTRAMUSCULAR

## 2019-12-28 MED ORDER — CYANOCOBALAMIN 1000 MCG/ML IJ SOLN
INTRAMUSCULAR | Status: AC
  Filled 2019-12-28: qty 1

## 2019-12-28 NOTE — Progress Notes (Signed)
[facility]  [phone]) [phone] [phone]   ID: Kristen Nielsen   DOB: 30-Oct-1950  MR#: [id number]  UMP#:[id number]  Patient Care Team: Tresa Garter, MD as PCP - General (Internal Medicine) Verner Chol, CNM as Referring Physician (Certified Nurse Midwife) Bernette Redbird, MD as Consulting Physician (Gastroenterology) Jodi Geralds, MD as Consulting Physician (Orthopedic Surgery) Angele Wiemann, Valentino Hue, MD as Consulting Physician (Oncology) OTHER MD:  CHIEF COMPLAINT: anemia  CURRENT TREATMENT: B-12 supplementation   INTERVAL HISTORY: Kristen Nielsen returns today for follow-up and treatment of her pernicious anemia.  She is currently receiving her shots every 8 weeks  Since her last visit, she underwent bilateral screening mammography with tomography at [facility] on 04/05/2019 showing: breast density category B; no evidence of malignancy in either breast.    REVIEW OF SYSTEMS: Kristen Nielsen had a knee replacement last year.  She still feels her knee is a bit stiff.  For exercise she does a little bit of walking around the house, has a stationary bike, and is learning line dance at the gym.  Despite the B12 replacement she feels tired, she thinks more tired than she should feel.  She is also developing more arthritis problems, including trigger fingers and such.  Aside from these issues a detailed review of systems today was stable   HISTORY OF PRESENT ILLNESS: From the original summary note:  The patient was diagnosed with pernicious anemia in 07/15/94. She started to receive her B12 shots here since she did not have a primary care physician. She has established herself in Dr. Loren Racer office, but still gets her injection here at her request   PAST MEDICAL HISTORY: Past Medical History:  Diagnosis Date  . Anemia   . Arthritis   . Environmental allergies   . Vitamin B 12 deficiency     PAST SURGICAL HISTORY: Past Surgical History:  Procedure  Laterality Date  . TONSILLECTOMY AND ADENOIDECTOMY    . TOTAL KNEE ARTHROPLASTY Left 09/18/2018   Procedure: TOTAL KNEE ARTHROPLASTY;  Surgeon: Jodi Geralds, MD;  Location: [facility];  Service: Orthopedics;  Laterality: Left;    FAMILY HISTORY Family History  Problem Relation Age of Onset  . Hypertension Mother   . Cancer Father        lung ca  . Hypertension Father   . Diabetes Father   . Thyroid disease Father   . Breast cancer Sister 57  . Breast cancer Paternal Aunt 13  . Cancer Brother 39       col ca  As of July of 2014, her parents are still alive. Her father has a history of prostate cancer. The patient has one sister, who was diagnosed with breast cancer the age of 68. She is now doing well. She has one brother who died from colon cancer in 15-Jul-2015.   GYNECOLOGIC HISTORY: GX P2, first live birth age 63. Change of life age 48.    SOCIAL HISTORY: (Updated October 2021). She is a retired Freight forwarder. She is divorced and lives by herself, although her 79 year old grandson who works for [employer] is currently living with her.. Her daughter lives in [place], [place], where the patient's son-in-law works as Heritage manager; the patient's son lives here in town. Kristen Nielsen has 4 grandchildren and attends a [employer].    ADVANCED DIRECTIVES: Not in place   HEALTH MAINTENANCE: Social History   Tobacco Use  . Smoking status: Never Smoker  . Smokeless tobacco: Never Used  Substance Use  Topics  . Alcohol use: Yes    Alcohol/week: 0.0 - 2.0 standard drinks    Comment: occ  . Drug use: No     Colonoscopy: Buccini  PAP: [place]ax  Bone density: 2007/ normal  Lipid panel: [place]  No Known Allergies  Current Outpatient Medications  Medication Sig Dispense Refill  . acetaminophen (TYLENOL) 500 MG tablet Take 500 mg by mouth daily as needed for moderate pain.    Marland Kitchen amitriptyline (ELAVIL) 25 MG tablet TAKE 1 TABLET BY MOUTH EVERYDAY AT BEDTIME    . aspirin EC 325 MG tablet  Take 1 tablet (325 mg total) by mouth 2 (two) times daily after a meal. Take x 1 month post op to decrease risk of blood clots. 60 tablet 0  . Cholecalciferol (VITAMIN D) 50 MCG (2000 UT) tablet Take 2,000 Units by mouth daily.    Marland Kitchen conjugated estrogens (PREMARIN) vaginal cream 1/2 GM VAGINALLY TWICE WEEKLY 30 g 2  . cyanocobalamin (COBAL-1000) 1000 MCG/ML injection Inject 1 mL (1,000 mcg total) into the muscle every 30 (thirty) days. (Patient taking differently: Inject 1,000 mcg into the muscle See admin instructions. Every other month) 10 mL 6  . diclofenac (VOLTAREN) 75 MG EC tablet      No current facility-administered medications for this visit.    OBJECTIVE: African-American woman who appears well Vitals:   12/28/19 1345  BP: 139/64  Pulse: (!) 53  Resp: 18  Temp: (!) 97.5 F (36.4 C)  SpO2: 99%   Wt Readings from Last 3 Encounters:  12/28/19 189 lb 12.8 oz (86.1 kg)  12/18/18 187 lb (84.8 kg)  12/01/18 188 lb 9.6 oz (85.5 kg)   Body mass index is 31.83 kg/m.    ECOG FS:1 - Symptomatic but completely ambulatory  Sclerae unicteric, EOMs intact Wearing a mask No cervical or supraclavicular adenopathy Lungs no rales or rhonchi Heart regular rate and rhythm Abd soft, nontender, positive bowel sounds MSK no focal spinal tenderness Neuro: nonfocal, well oriented, appropriate affect Breasts: Deferred   LAB RESULTS: Lab Results  Component Value Date   WBC 7.9 12/28/2019   NEUTROABS 5.4 12/28/2019   HGB 11.6 (L) 12/28/2019   HCT 37.1 12/28/2019   MCV 82.6 12/28/2019   PLT 251 12/28/2019      Chemistry      Component Value Date/Time   NA 139 09/16/2018 1403   NA 142 10/03/2014 1046      Component Value Date/Time   CALCIUM 9.7 09/16/2018 1403   CALCIUM 9.9 10/03/2014 1046       No results found for: LABCA2  No components found for: WVPXT062  No results for input(s): INR in the last 168 hours.  Urinalysis    Component Value Date/Time   COLORURINE  STRAW (A) 09/16/2018 1403    STUDIES: No results found.   ASSESSMENT: 69 y.o. [place] woman with history of pernicious anemia diagnosed in 1996 with a positive intrinsic factor for blocking antibody.  Receives B12 injections on a bi-monthly basis.   PLAN:  Kristen Nielsen prefers to receive her B12 supplementation here.  She wonders if we did that every month whether she would have more energy.  I do not think so, in general if we have a hemoglobin of 10 we are pretty functional.  I think her tiredness may be due to other causes.  One of them could be hypothyroidism since pernicious anemia is frequently associated with that.  She will be seeing her primary care physician Dr. Posey Rea and he  normally she tells me checks her thyroid function.  A second reason for her fatigue is lack of exercise.  She does have multiple opportunities for exercise but she really is not using them fully.  I suggested she do 35 to 45 minutes a day of any of the number of activities that she enjoys doing including line dancing, walking, and her stationary bike  Otherwise we are continuing the B12 supplementation every 8 weeks.  She will see me again in 1 year.  She knows to call for any other issue that may develop before then  Total encounter time 20 minutes.*   Henrine Hayter, Valentino Hue, MD  12/28/19 2:07 PM Medical Oncology and Hematology [facility] [address] [place], [place] [address] Tel. [phone]    Fax. [phone]   I, Mickie Bail, am acting as scribe for Dr. Valentino Hue. Valary Manahan.  I, Ruthann Cancer MD, have reviewed the above documentation for accuracy and completeness, and I agree with the above.    *Total Encounter Time as defined by the Centers for Medicare and Medicaid Services includes, in addition to the face-to-face time of a patient visit (documented in the note above) non-face-to-face time: obtaining and reviewing outside history, ordering and reviewing medications,  tests or procedures, care coordination (communications with other health care professionals or caregivers) and documentation in the medical record.

## 2019-12-28 NOTE — Patient Instructions (Signed)
Cyanocobalamin, Pyridoxine, and Folate What is this medicine? A multivitamin containing folic acid, vitamin B6, and vitamin B12. This medicine may be used for other purposes; ask your health care provider or pharmacist if you have questions. COMMON BRAND NAME(S): AllanFol RX, AllanTex, Av-Vite FB, B Complex with Folic Acid, ComBgen, FaBB, Folamin, Folastin, Folbalin, Folbee, Folbic, Folcaps, Folgard, Folgard RX, Folgard RX 2.2, Folplex, Folplex 2.2, Foltabs 800, Foltx, Homocysteine Formula, Niva-Fol, NuFol, TL Gard RX, Virt-Gard, Virt-Vite, Virt-Vite Forte, Vita-Respa What should I tell my health care provider before I take this medicine? They need to know if you have any of these conditions:  bleeding or clotting disorder  history of anemia of any type  other chronic health condition  an unusual or allergic reaction to vitamins, other medicines, foods, dyes, or preservatives  pregnant or trying to get pregnant  breast-feeding How should I use this medicine? Take by mouth with a glass of water. May take with food. Follow the directions on the prescription label. It is usually given once a day. Do not take your medicine more often than directed. Contact your pediatrician regarding the use of this medicine in children. Special care may be needed. Overdosage: If you think you have taken too much of this medicine contact a poison control center or emergency room at once. NOTE: This medicine is only for you. Do not share this medicine with others. What if I miss a dose? If you miss a dose, take it as soon as you can. If it is almost time for your next dose, take only that dose. Do not take double or extra doses. What may interact with this medicine?  levodopa This list may not describe all possible interactions. Give your health care provider a list of all the medicines, herbs, non-prescription drugs, or dietary supplements you use. Also tell them if you smoke, drink alcohol, or use illegal  drugs. Some items may interact with your medicine. What should I watch for while using this medicine? See your health care professional for regular checks on your progress. Remember that vitamin supplements do not replace the need for good nutrition from a balanced diet. What side effects may I notice from receiving this medicine? Side effects that you should report to your doctor or health care professional as soon as possible:  allergic reaction such as skin rash or difficulty breathing  vomiting Side effects that usually do not require medical attention (report to your doctor or health care professional if they continue or are bothersome):  nausea  stomach upset This list may not describe all possible side effects. Call your doctor for medical advice about side effects. You may report side effects to FDA at 1-800-FDA-1088. Where should I keep my medicine? Keep out of the reach of children. Most vitamins should be stored at controlled room temperature. Check your specific product directions. Protect from heat and moisture. Throw away any unused medicine after the expiration date. NOTE: This sheet is a summary. It may not cover all possible information. If you have questions about this medicine, talk to your doctor, pharmacist, or health care provider.  2020 Elsevier/Gold Standard (2007-05-02 00:59:55)  

## 2019-12-30 ENCOUNTER — Ambulatory Visit: Payer: Medicare PPO | Attending: Internal Medicine

## 2019-12-30 DIAGNOSIS — Z23 Encounter for immunization: Secondary | ICD-10-CM

## 2019-12-30 NOTE — Progress Notes (Signed)
   Covid-19 Vaccination Clinic  Name:  Kristen Nielsen    MRN: [id number] DOB: June 10, 1950  12/30/2019  [name] was observed post Covid-19 immunization for 15 minutes without incident. She was provided with Vaccine Information Sheet and instruction to access the V-Safe system.   [name] was instructed to call 911 with any severe reactions post vaccine: Marland Kitchen Difficulty breathing  . Swelling of face and throat  . A fast heartbeat  . A bad rash all over body  . Dizziness and weakness

## 2020-02-22 ENCOUNTER — Inpatient Hospital Stay: Payer: Medicare PPO | Attending: Oncology

## 2020-02-22 ENCOUNTER — Other Ambulatory Visit: Payer: Self-pay

## 2020-02-22 VITALS — BP 139/70 | HR 55 | Temp 98.7°F | Resp 18

## 2020-02-22 DIAGNOSIS — D510 Vitamin B12 deficiency anemia due to intrinsic factor deficiency: Secondary | ICD-10-CM | POA: Diagnosis present

## 2020-02-22 MED ORDER — CYANOCOBALAMIN 1000 MCG/ML IJ SOLN
1000.0000 ug | Freq: Once | INTRAMUSCULAR | Status: AC
  Administered 2020-02-22: 1000 ug via INTRAMUSCULAR

## 2020-02-22 MED ORDER — CYANOCOBALAMIN 1000 MCG/ML IJ SOLN
INTRAMUSCULAR | Status: AC
  Filled 2020-02-22: qty 1

## 2020-03-08 IMAGING — CT CT CHEST WITH CONTRAST
2 of 4 series · 14 of 36 positions shown, 17 images · IV contrast (iopamidol)
Comparison: Chest radiograph 09/16/2018

CLINICAL DATA: F/u abnormal cxr in pacs Nonsmoker Surgery-none
Cancer-none No complaints ^75mL HW5S03-LCC IOPAMIDOL (HW5S03-LCC)
INJECTION 61%

EXAM:
CT CHEST WITH CONTRAST
TECHNIQUE: Multidetector CT imaging of the chest was performed during
intravenous contrast administration.
CONTRAST:  75mL HW5S03-LCC IOPAMIDOL (HW5S03-LCC) INJECTION 61%

[Series 2: chest 2.00 br40 s3 · axial · 0.69mm/px · z∈[+1640,+1882]mm · 11 of 144 slices shown, 14 images (1 of 2)]
[im 12/144  mediastinal]
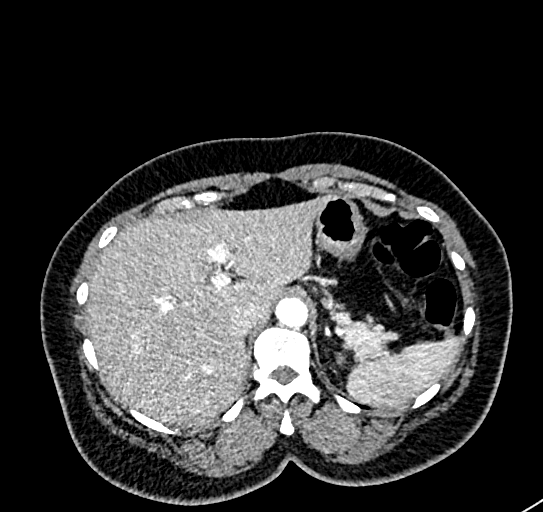
[im 12/144  lung]
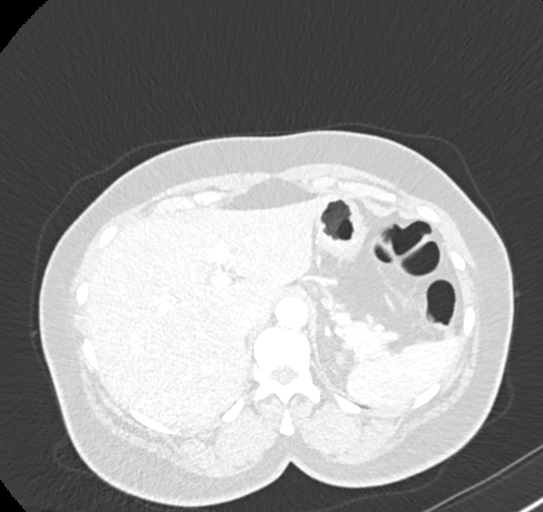
[im 23/144  lung]
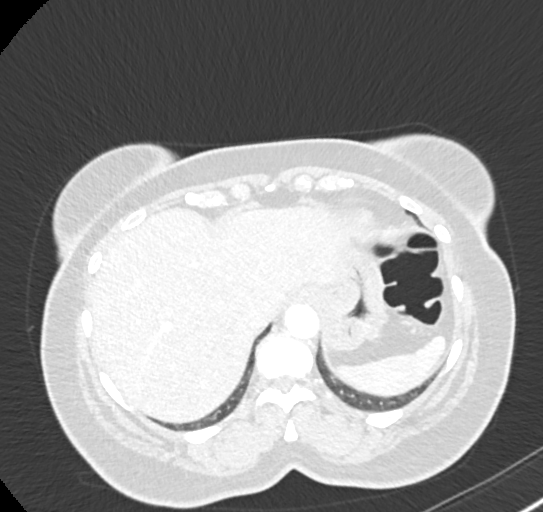
[im 34/144  lung]
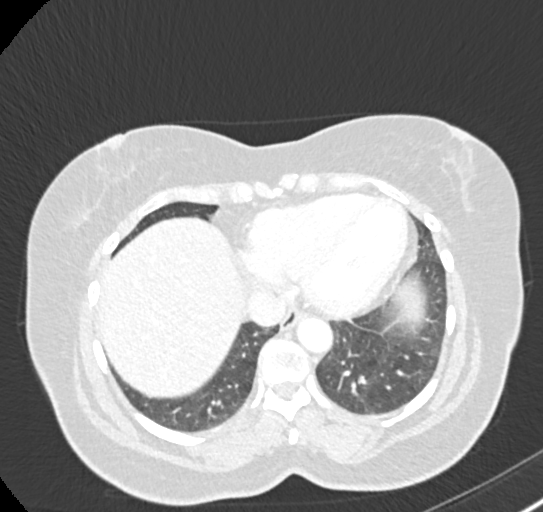
[im 45/144  lung]
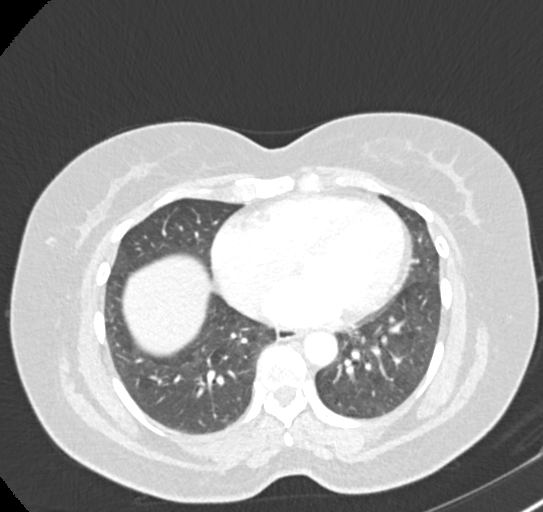
[im 56/144  mediastinal]
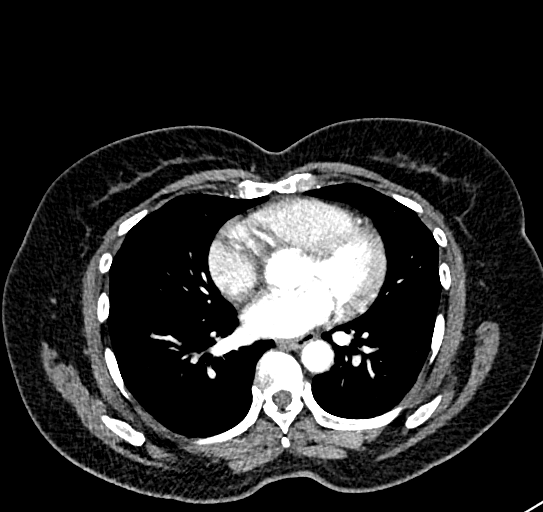
[im 56/144  lung]
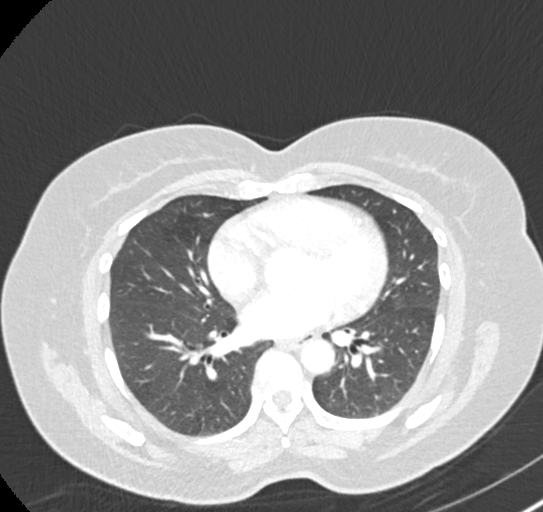
[im 78/144  lung]
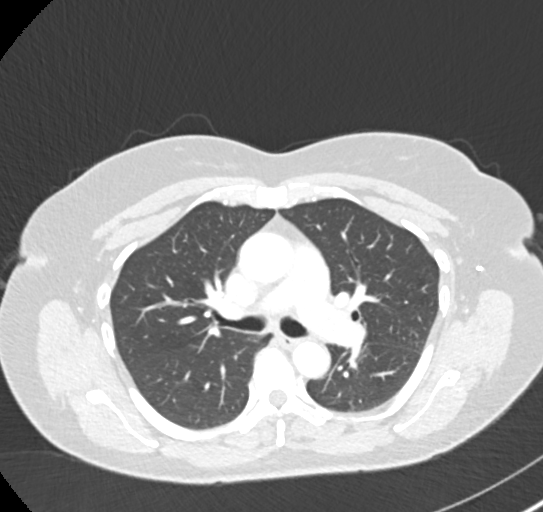
[im 89/144  lung]
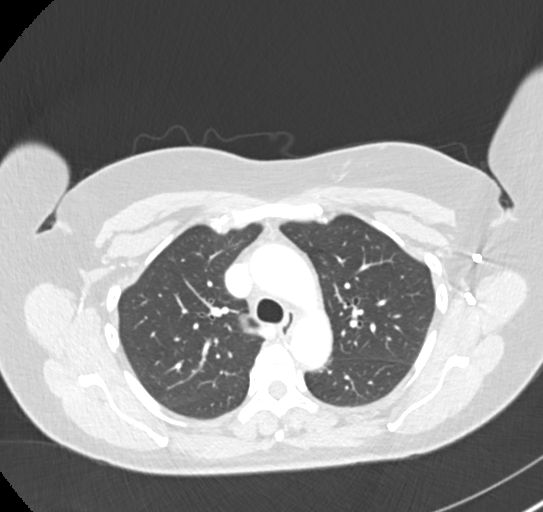
[im 100/144  lung]
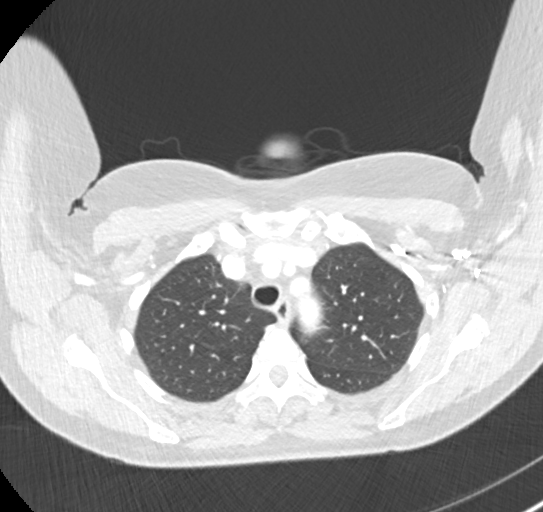
[im 111/144  mediastinal]
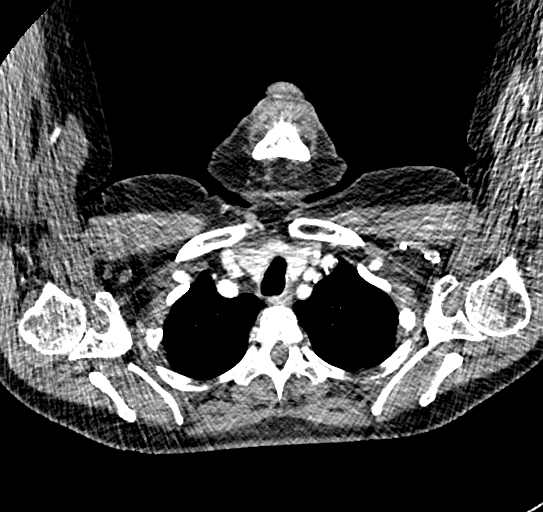
[im 111/144  lung]
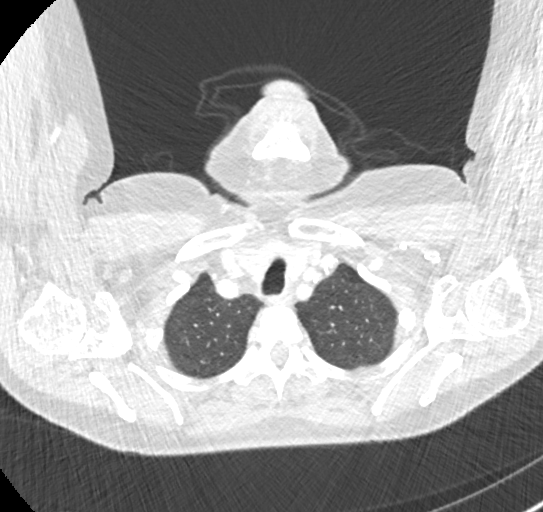
[im 122/144  lung]
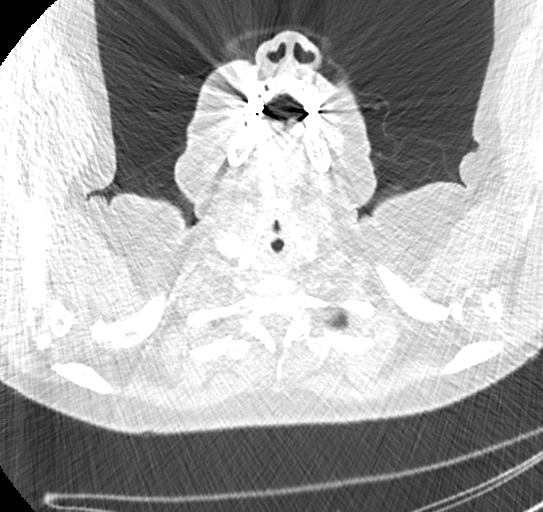
[im 133/144  lung]
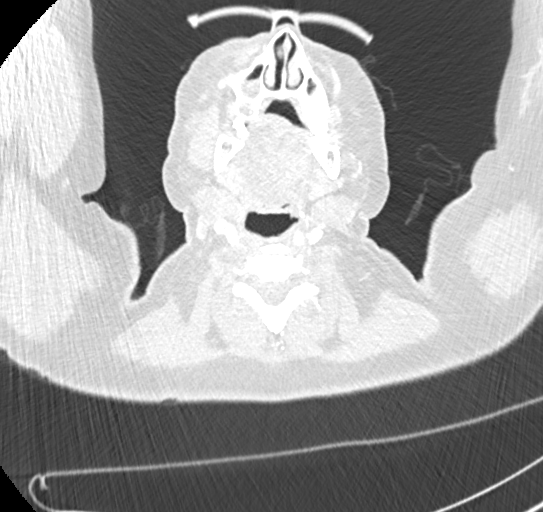

[Series 4: chest 2.00 br40 s3 · coronal · 0.56mm/px · 3 of 176 slices shown (2 of 2)]
[im 36/176  lung]
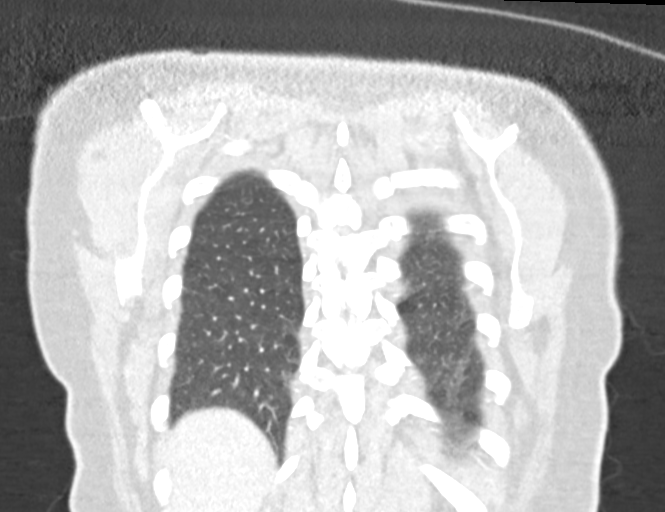
[im 71/176  lung]
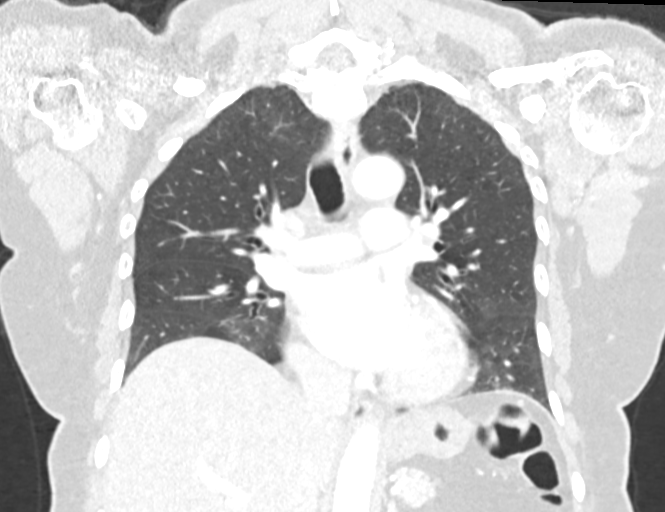
[im 106/176  lung]
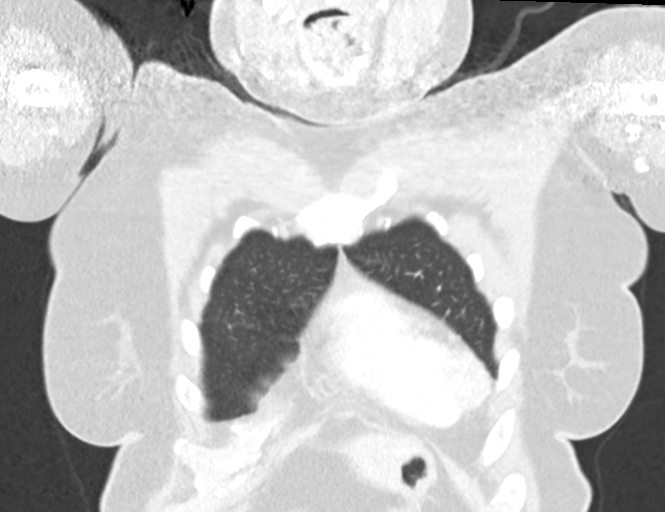

[14 of 36 positions shown; findings below may reference images not displayed]

FINDINGS: Cardiovascular: No significant vascular findings. Heart size at the
upper limits of normal. No pericardial effusion.

Mediastinum/Nodes: No enlarged mediastinal, hilar, or axillary lymph
nodes. Thyroid gland, trachea, and esophagus demonstrate no
significant findings.

Lungs/Pleura: Minimal ground-glass at the bilateral lung bases
likely reflect atelectasis. No suspicious pulmonary nodule. There is
no suspicious abnormality to correspond to the previously questioned
radiographic finding. This appears to represent a confluence of left
hilar vessels.124 The central airways are patent. No pleural
effusion or pneumothorax.

Upper Abdomen: No acute abnormality.

Musculoskeletal: No chest wall abnormality. No acute or significant
osseous findings.
IMPRESSION: No suspicious abnormality in the left lung at the site of the
previously questioned finding on chest radiograph. This appears to
represent a confluence of left hilar vessels.

## 2020-04-18 ENCOUNTER — Other Ambulatory Visit: Payer: Self-pay

## 2020-04-18 ENCOUNTER — Inpatient Hospital Stay: Payer: Medicare PPO | Attending: Oncology

## 2020-04-18 VITALS — BP 146/70 | HR 67

## 2020-04-18 DIAGNOSIS — D510 Vitamin B12 deficiency anemia due to intrinsic factor deficiency: Secondary | ICD-10-CM | POA: Insufficient documentation

## 2020-04-18 MED ORDER — CYANOCOBALAMIN 1000 MCG/ML IJ SOLN
1000.0000 ug | Freq: Once | INTRAMUSCULAR | Status: AC
  Administered 2020-04-18: 1000 ug via INTRAMUSCULAR

## 2020-04-18 NOTE — Patient Instructions (Signed)

## 2020-05-24 ENCOUNTER — Other Ambulatory Visit: Payer: Self-pay

## 2020-05-25 ENCOUNTER — Other Ambulatory Visit: Payer: Self-pay

## 2020-05-25 ENCOUNTER — Encounter: Payer: Self-pay | Admitting: Internal Medicine

## 2020-05-25 ENCOUNTER — Ambulatory Visit (INDEPENDENT_AMBULATORY_CARE_PROVIDER_SITE_OTHER): Payer: Medicare PPO | Admitting: Internal Medicine

## 2020-05-25 VITALS — BP 132/72 | HR 63 | Temp 98.6°F | Ht 64.75 in | Wt 186.8 lb

## 2020-05-25 DIAGNOSIS — Z800 Family history of malignant neoplasm of digestive organs: Secondary | ICD-10-CM

## 2020-05-25 DIAGNOSIS — E559 Vitamin D deficiency, unspecified: Secondary | ICD-10-CM

## 2020-05-25 DIAGNOSIS — Z0000 Encounter for general adult medical examination without abnormal findings: Secondary | ICD-10-CM

## 2020-05-25 DIAGNOSIS — D510 Vitamin B12 deficiency anemia due to intrinsic factor deficiency: Secondary | ICD-10-CM

## 2020-05-25 DIAGNOSIS — M25562 Pain in left knee: Secondary | ICD-10-CM

## 2020-05-25 DIAGNOSIS — G8929 Other chronic pain: Secondary | ICD-10-CM

## 2020-05-25 DIAGNOSIS — E785 Hyperlipidemia, unspecified: Secondary | ICD-10-CM

## 2020-05-25 LAB — CBC WITH DIFFERENTIAL/PLATELET
Basophils Absolute: 0 10*3/uL (ref 0.0–0.1)
Basophils Relative: 0.4 % (ref 0.0–3.0)
Eosinophils Absolute: 0.3 10*3/uL (ref 0.0–0.7)
Eosinophils Relative: 4.8 % (ref 0.0–5.0)
HCT: 38.6 % (ref 36.0–46.0)
Hemoglobin: 12.4 g/dL (ref 12.0–15.0)
Lymphocytes Relative: 18 % (ref 12.0–46.0)
Lymphs Abs: 1.2 10*3/uL (ref 0.7–4.0)
MCHC: 32.2 g/dL (ref 30.0–36.0)
MCV: 82.4 fl (ref 78.0–100.0)
Monocytes Absolute: 0.7 10*3/uL (ref 0.1–1.0)
Monocytes Relative: 10.9 % (ref 3.0–12.0)
Neutro Abs: 4.4 10*3/uL (ref 1.4–7.7)
Neutrophils Relative %: 65.9 % (ref 43.0–77.0)
Platelets: 262 10*3/uL (ref 150.0–400.0)
RBC: 4.68 Mil/uL (ref 3.87–5.11)
RDW: 13.6 % (ref 11.5–15.5)
WBC: 6.7 10*3/uL (ref 4.0–10.5)

## 2020-05-25 LAB — URINALYSIS
Bilirubin Urine: NEGATIVE
Hgb urine dipstick: NEGATIVE
Ketones, ur: NEGATIVE
Leukocytes,Ua: NEGATIVE
Nitrite: NEGATIVE
Specific Gravity, Urine: 1.015 (ref 1.000–1.030)
Total Protein, Urine: NEGATIVE
Urine Glucose: NEGATIVE
Urobilinogen, UA: 0.2 (ref 0.0–1.0)
pH: 7.5 (ref 5.0–8.0)

## 2020-05-25 LAB — COMPREHENSIVE METABOLIC PANEL
ALT: 16 U/L (ref 0–35)
AST: 19 U/L (ref 0–37)
Albumin: 4.2 g/dL (ref 3.5–5.2)
Alkaline Phosphatase: 110 U/L (ref 39–117)
BUN: 12 mg/dL (ref 6–23)
CO2: 28 mEq/L (ref 19–32)
Calcium: 9.8 mg/dL (ref 8.4–10.5)
Chloride: 106 mEq/L (ref 96–112)
Creatinine, Ser: 0.8 mg/dL (ref 0.40–1.20)
GFR: 75.35 mL/min (ref >60.00)
Glucose, Bld: 76 mg/dL (ref 70–99)
Potassium: 3.9 mEq/L (ref 3.5–5.1)
Sodium: 139 mEq/L (ref 135–145)
Total Bilirubin: 0.5 mg/dL (ref 0.2–1.2)
Total Protein: 7.1 g/dL (ref 6.0–8.3)

## 2020-05-25 LAB — LIPID PANEL
Cholesterol: 182 mg/dL (ref 0–200)
HDL: 61 mg/dL (ref >39.00)
LDL Cholesterol: 105 mg/dL — ABNORMAL HIGH (ref 0–99)
NonHDL: 121.08
Total CHOL/HDL Ratio: 3
Triglycerides: 79 mg/dL (ref 0.0–149.0)
VLDL: 15.8 mg/dL (ref 0.0–40.0)

## 2020-05-25 LAB — TSH: TSH: 0.71 u[IU]/mL (ref 0.35–4.50)

## 2020-05-25 LAB — VITAMIN B12: Vitamin B-12: 301 pg/mL (ref 211–911)

## 2020-05-25 LAB — VITAMIN D 25 HYDROXY (VIT D DEFICIENCY, FRACTURES): VITD: 31.79 ng/mL (ref 30.00–100.00)

## 2020-05-25 MED ORDER — MELOXICAM 15 MG PO TABS: 15.0000 mg | ORAL_TABLET | Freq: Every day | ORAL | 0 refills | Status: DC

## 2020-05-25 NOTE — Assessment & Plan Note (Addendum)
  We discussed age appropriate health related issues, including available/recomended screening tests and vaccinations. Labs were ordered to be later reviewed . All questions were answered. We discussed one or more of the following - seat belt use, use of sunscreen/sun exposure exercise, safe sex, fall risk reduction, second hand smoke exposure, firearm use and storage, seat belt use, a need for adhering to healthy diet and exercise. Labs were ordered.  All questions were answered. GYN checkup/mammogram yearly.  Follow-up colonoscopy with Dr. Matthias Hughs A cardiac CT scan for calcium scoring offered -ordered

## 2020-05-25 NOTE — Assessment & Plan Note (Signed)
Dr Matthias Hughs Last Colon 2018, due soon

## 2020-05-25 NOTE — Assessment & Plan Note (Signed)
On B12 shots 

## 2020-05-25 NOTE — Progress Notes (Signed)
Subjective:  Patient ID: Kristen Nielsen, female    DOB: February 22, 1951  Age: 70 y.o. MRN: [id number]  CC: Annual Exam   HPI Kristen Nielsen presents for a well exam C/o L knee stiffness - pt had L TKR in the summer 2020  Outpatient Medications Prior to Visit  Medication Sig Dispense Refill  . acetaminophen (TYLENOL) 500 MG tablet Take 500 mg by mouth daily as needed for moderate pain.    Marland Kitchen aspirin EC 325 MG tablet Take 1 tablet (325 mg total) by mouth 2 (two) times daily after a meal. Take x 1 month post op to decrease risk of blood clots. 60 tablet 0  . Cholecalciferol (VITAMIN D) 50 MCG (2000 UT) tablet Take 2,000 Units by mouth daily.    Marland Kitchen conjugated estrogens (PREMARIN) vaginal cream 1/2 GM VAGINALLY TWICE WEEKLY 30 g 2  . cyanocobalamin (COBAL-1000) 1000 MCG/ML injection Inject 1 mL (1,000 mcg total) into the muscle every 30 (thirty) days. (Patient taking differently: Inject 1,000 mcg into the muscle See admin instructions. Every other month) 10 mL 6  . diclofenac (VOLTAREN) 75 MG EC tablet     . amitriptyline (ELAVIL) 25 MG tablet TAKE 1 TABLET BY MOUTH EVERYDAY AT BEDTIME     No facility-administered medications prior to visit.    ROS: Review of Systems  Constitutional: Negative for activity change, appetite change, chills, fatigue and unexpected weight change.  HENT: Negative for congestion, mouth sores and sinus pressure.   Eyes: Negative for visual disturbance.  Respiratory: Negative for cough and chest tightness.   Gastrointestinal: Negative for abdominal pain and nausea.  Genitourinary: Negative for difficulty urinating, frequency and vaginal pain.  Musculoskeletal: Positive for arthralgias. Negative for back pain and gait problem.  Skin: Negative for pallor and rash.  Neurological: Negative for dizziness, tremors, weakness, numbness and headaches.  Psychiatric/Behavioral: Negative for confusion, sleep disturbance and suicidal ideas.    Objective:  BP 132/72 (BP  Location: Left Arm)   Pulse 63   Temp 98.6 F (37 C) (Oral)   Ht 5' 4.75" (1.645 m)   Wt 186 lb 12.8 oz (84.7 kg)   LMP 03/25/2008   SpO2 96%   BMI 31.33 kg/m   BP Readings from Last 3 Encounters:  05/25/20 132/72  04/18/20 (!) 146/70  02/22/20 139/70    Wt Readings from Last 3 Encounters:  05/25/20 186 lb 12.8 oz (84.7 kg)  12/28/19 189 lb 12.8 oz (86.1 kg)  12/18/18 187 lb (84.8 kg)    Physical Exam Constitutional:      General: She is not in acute distress.    Appearance: She is well-developed.  HENT:     Head: Normocephalic.     Right Ear: External ear normal.     Left Ear: External ear normal.     Nose: Nose normal.     Mouth/Throat:     Mouth: Oropharynx is clear and moist.  Eyes:     General:        Right eye: No discharge.        Left eye: No discharge.     Conjunctiva/sclera: Conjunctivae normal.     Pupils: Pupils are equal, round, and reactive to light.  Neck:     Thyroid: No thyromegaly.     Vascular: No JVD.     Trachea: No tracheal deviation.  Cardiovascular:     Rate and Rhythm: Normal rate and regular rhythm.     Heart sounds: Normal heart sounds.  Pulmonary:  Effort: No respiratory distress.     Breath sounds: No stridor. No wheezing.  Abdominal:     General: Bowel sounds are normal. There is no distension.     Palpations: Abdomen is soft. There is no mass.     Tenderness: There is no abdominal tenderness. There is no guarding or rebound.  Musculoskeletal:        General: No tenderness or edema.     Cervical back: Normal range of motion and neck supple.  Lymphadenopathy:     Cervical: No cervical adenopathy.  Skin:    Findings: No erythema or rash.  Neurological:     Mental Status: She is oriented to person, place, and time.     Cranial Nerves: No cranial nerve deficit.     Motor: No abnormal muscle tone.     Coordination: Coordination normal.     Deep Tendon Reflexes: Reflexes normal.  Psychiatric:        Mood and Affect: Mood  and affect normal.        Behavior: Behavior normal.        Thought Content: Thought content normal.        Judgment: Judgment normal.   L knee w/a scar, slight edema  Lab Results  Component Value Date   WBC 7.9 12/28/2019   HGB 11.6 (L) 12/28/2019   HCT 37.1 12/28/2019   PLT 251 12/28/2019   GLUCOSE 92 09/16/2018   CHOL 197 04/21/2013   TRIG 78.0 04/21/2013   HDL 62.50 04/21/2013   LDLCALC 119 (H) 04/21/2013   ALT 19 09/16/2018   AST 20 09/16/2018   NA 139 09/16/2018   K 4.4 09/16/2018   CL 106 09/16/2018   CREATININE 0.81 09/16/2018   BUN 18 09/16/2018   CO2 26 09/16/2018   TSH 0.596 11/04/2017   INR 1.1 09/16/2018    MM 3D SCREEN BREAST BILATERAL  Result Date: 04/06/2019 CLINICAL DATA:  Screening. EXAM: DIGITAL SCREENING BILATERAL MAMMOGRAM WITH TOMO AND CAD COMPARISON:  Previous exam(s). ACR Breast Density Category b: There are scattered areas of fibroglandular density. FINDINGS: There are no findings suspicious for malignancy. Images were processed with CAD. IMPRESSION: No mammographic evidence of malignancy. A result letter of this screening mammogram will be mailed directly to the patient. RECOMMENDATION: Screening mammogram in one year. (Code:SM-B-01Y) BI-RADS CATEGORY  1: Negative. Electronically Signed   By: Sande Brothers M.D.   On: 04/06/2019 10:27    Assessment & Plan:    Sonda Primes, MD

## 2020-05-25 NOTE — Assessment & Plan Note (Signed)
Post - op stiffness Try Meloxicam x 3 months  Potential benefits of a long term NSAID use as well as potential risks  and complications were explained to the patient and were aknowledged.

## 2020-05-25 NOTE — Patient Instructions (Signed)
Cardiac CT calcium scoring test $99 Tel # is [phone]   Computed tomography, more commonly known as a CT or CAT scan, is a diagnostic medical imaging test. Like traditional x-rays, it produces multiple images or pictures of the inside of the body. The cross-sectional images generated during a CT scan can be reformatted in multiple planes. They can even generate three-dimensional images. These images can be viewed on a computer monitor, printed on film or by a 3D printer, or transferred to a CD or DVD. CT images of internal organs, bones, soft tissue and blood vessels provide greater detail than traditional x-rays, particularly of soft tissues and blood vessels. A cardiac CT scan for coronary calcium is a non-invasive way of obtaining information about the presence, location and extent of calcified plaque in the coronary arteries--the vessels that supply oxygen-containing blood to the heart muscle. Calcified plaque results when there is a build-up of fat and other substances under the inner layer of the artery. This material can calcify which signals the presence of atherosclerosis, a disease of the vessel wall, also called coronary artery disease (CAD). People with this disease have an increased risk for heart attacks. In addition, over time, progression of plaque build up (CAD) can narrow the arteries or even close off blood flow to the heart. The result may be chest pain, sometimes called "angina," or a heart attack. Because calcium is a marker of CAD, the amount of calcium detected on a cardiac CT scan is a helpful prognostic tool. The findings on cardiac CT are expressed as a calcium score. Another name for this test is coronary artery calcium scoring.  What are some common uses of the procedure? The goal of cardiac CT scan for calcium scoring is to determine if CAD is present and to what extent, even if there are no symptoms. It is a screening study that may be recommended by a physician for  patients with risk factors for CAD but no clinical symptoms. The major risk factors for CAD are: . high blood cholesterol levels  . family history of heart attacks  . diabetes  . high blood pressure  . cigarette smoking  . overweight or obese  . physical inactivity   A negative cardiac CT scan for calcium scoring shows no calcification within the coronary arteries. This suggests that CAD is absent or so minimal it cannot be seen by this technique. The chance of having a heart attack over the next two to five years is very low under these circumstances. A positive test means that CAD is present, regardless of whether or not the patient is experiencing any symptoms. The amount of calcification--expressed as the calcium score--may help to predict the likelihood of a myocardial infarction (heart attack) in the coming years and helps your medical doctor or cardiologist decide whether the patient may need to take preventive medicine or undertake other measures such as diet and exercise to lower the risk for heart attack. The extent of CAD is graded according to your calcium score:  Calcium Score  Presence of CAD (coronary artery disease)  0 No evidence of CAD   1-10 Minimal evidence of CAD  11-100 Mild evidence of CAD  101-400 Moderate evidence of CAD  Over 400 Extensive evidence of CAD    

## 2020-05-26 ENCOUNTER — Other Ambulatory Visit: Payer: Self-pay | Admitting: Internal Medicine

## 2020-05-26 MED ORDER — VITAMIN D 50 MCG (2000 UT) PO TABS: 4000.0000 [IU] | ORAL_TABLET | Freq: Every day | ORAL | 11 refills | Status: AC

## 2020-06-08 ENCOUNTER — Other Ambulatory Visit: Payer: Self-pay | Admitting: Internal Medicine

## 2020-06-08 DIAGNOSIS — Z1231 Encounter for screening mammogram for malignant neoplasm of breast: Secondary | ICD-10-CM

## 2020-06-13 ENCOUNTER — Inpatient Hospital Stay: Payer: Medicare PPO | Attending: Oncology

## 2020-06-13 ENCOUNTER — Other Ambulatory Visit: Payer: Self-pay

## 2020-06-13 ENCOUNTER — Telehealth: Payer: Self-pay

## 2020-06-13 VITALS — BP 141/67 | HR 60

## 2020-06-13 DIAGNOSIS — D510 Vitamin B12 deficiency anemia due to intrinsic factor deficiency: Secondary | ICD-10-CM | POA: Insufficient documentation

## 2020-06-13 MED ORDER — CYANOCOBALAMIN 1000 MCG/ML IJ SOLN
1000.0000 ug | Freq: Once | INTRAMUSCULAR | Status: AC
  Administered 2020-06-13: 1000 ug via INTRAMUSCULAR

## 2020-06-13 MED ORDER — CYANOCOBALAMIN 1000 MCG/ML IJ SOLN
INTRAMUSCULAR | Status: AC
  Filled 2020-06-13: qty 1

## 2020-06-13 NOTE — Telephone Encounter (Signed)
Patient presented to clinic for B12 injection.  Pt stated that PCP recommendations for B12 injection Q 6 weeks vs Q 2 months.   RN reviewed with MD -  MD OK with injections Q 6 weeks.  Scheduling message sent to set up for injections.

## 2020-06-16 ENCOUNTER — Encounter: Payer: Self-pay | Admitting: Internal Medicine

## 2020-06-20 ENCOUNTER — Ambulatory Visit (INDEPENDENT_AMBULATORY_CARE_PROVIDER_SITE_OTHER): Payer: Medicare PPO

## 2020-06-20 ENCOUNTER — Other Ambulatory Visit: Payer: Self-pay

## 2020-06-20 VITALS — BP 120/70 | HR 55 | Temp 98.3°F | Resp 16 | Ht 65.0 in | Wt 186.2 lb

## 2020-06-20 DIAGNOSIS — Z0000 Encounter for general adult medical examination without abnormal findings: Secondary | ICD-10-CM

## 2020-06-20 NOTE — Progress Notes (Addendum)
Subjective:   Kristen Nielsen is a 70 y.o. female who presents for Medicare Annual (Subsequent) preventive examination.  Review of Systems    No ROS. Medicare Wellness Visit. Additional risk factors are reflected in social history. Cardiac Risk Factors include: advanced age (>51men, >4 women);family history of premature cardiovascular disease;obesity (BMI >30kg/m2)     Objective:    Today's Vitals   06/20/20 1356  BP: 120/70  Pulse: (!) 55  Resp: 16  Temp: 98.3 F (36.8 C)  SpO2: 98%  Weight: 186 lb 3.2 oz (84.5 kg)  Height: 5\' 5"  (1.651 m)   Body mass index is 30.99 kg/m.  Advanced Directives 06/20/2020 09/18/2018 09/16/2018 10/02/2015  Does Patient Have a Medical Advance Directive? No No No No  Would patient like information on creating a medical advance directive? No - Patient declined No - Patient declined No - Guardian declined -    Current Medications (verified) Outpatient Encounter Medications as of 06/20/2020  Medication Sig   acetaminophen (TYLENOL) 500 MG tablet Take 500 mg by mouth daily as needed for moderate pain.   Cholecalciferol (VITAMIN D) 50 MCG (2000 UT) tablet Take 2 tablets (4,000 Units total) by mouth daily.   conjugated estrogens (PREMARIN) vaginal cream 1/2 GM VAGINALLY TWICE WEEKLY   cyanocobalamin (COBAL-1000) 1000 MCG/ML injection Inject 1 mL (1,000 mcg total) into the muscle every 30 (thirty) days. (Patient taking differently: Inject 1,000 mcg into the muscle See admin instructions. Every other month)   meloxicam (MOBIC) 15 MG tablet Take 1 tablet (15 mg total) by mouth daily.   aspirin EC 325 MG tablet Take 1 tablet (325 mg total) by mouth 2 (two) times daily after a meal. Take x 1 month post op to decrease risk of blood clots. (Patient taking differently: Take 81 mg by mouth once. decrease risk of blood clots.)   [DISCONTINUED] diclofenac (VOLTAREN) 75 MG EC tablet    No facility-administered encounter medications on file as of 06/20/2020.     Allergies (verified) Patient has no known allergies.   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   Procedure: TOTAL KNEE ARTHROPLASTY;  Surgeon: 09/20/2018, MD;  Location: [facility];  Service: Orthopedics;  Laterality: Left;   Family History  Problem Relation Age of Onset   Hypertension Mother    Cancer Father        lung ca   Hypertension Father    Diabetes Father    Thyroid disease Father    Breast cancer Sister 71   Breast cancer Paternal Aunt 40   Cancer Brother 42       col ca   Social History   Socioeconomic History   Marital status: Divorced    Spouse name: Not on file   Number of children: Not on file   Years of education: Not on file   Highest education level: Not on file  Occupational History   Not on file  Tobacco Use   Smoking status: Never Smoker   Smokeless tobacco: Never Used  Substance and Sexual Activity   Alcohol use: Yes    Alcohol/week: 0.0 - 2.0 standard drinks    Comment: occ   Drug use: No   Sexual activity: Not Currently    Partners:  Male    Birth control/protection: Post-menopausal  Other Topics Concern   Not on file  Social History Narrative   Not on file   Social Determinants of Health   Financial Resource Strain: Low Risk    Difficulty of Paying Living Expenses: Not hard at all  Food Insecurity: No Food Insecurity   Worried About Programme researcher, broadcasting/film/videounning Out of Food in the Last Year: Never true   Ran Out of Food in the Last Year: Never true  Transportation Needs: No Transportation Needs   Lack of Transportation (Medical): No   Lack of Transportation (Non-Medical): No  Physical Activity: Sufficiently Active   Days of Exercise per Week: 5 days   Minutes of Exercise per Session: 30 min  Stress: No Stress Concern Present    Feeling of Stress : Not at all  Social Connections: Moderately Integrated   Frequency of Communication with Friends and Family: More than three times a week   Frequency of Social Gatherings with Friends and Family: Once a week   Attends Religious Services: 1 to 4 times per year   Active Member of [employer] Financial[employer]ubs or Organizations: No   Attends Engineer, structuralClub or Organization Meetings: 1 to 4 times per year   Marital Status: Never married    Tobacco Counseling Counseling given: Not Answered   Clinical Intake:  Pre-visit preparation completed: No  Pain : No/denies pain     BMI - recorded: 30.99 Nutritional Status: BMI > 30  Obese Nutritional Risks: None Diabetes: No  How often do you need to have someone help you when you read instructions, pamphlets, or other written materials from your doctor or pharmacy?: 1 - Never What is the last grade level you completed in school?: Master's Degree from [employer]  Diabetic? no  Interpreter Needed?: No  Information entered by :: Susie CassetteShenika Scheryl Sanborn, LPN   Activities of Daily Living In your present state of health, do you have any difficulty performing the following activities: 06/20/2020  Hearing? Y  Vision? N  Difficulty concentrating or making decisions? N  Walking or climbing stairs? N  Dressing or bathing? N  Doing errands, shopping? N  Preparing Food and eating ? N  Using the Toilet? N  In the past six months, have you accidently leaked urine? N  Do you have problems with loss of bowel control? N  Managing your Medications? N  Managing your Finances? N  Housekeeping or managing your Housekeeping? N  Some recent data might be hidden    Patient Care Team: Plotnikov, Georgina QuintAleksei V, MD as PCP - General (Internal Medicine) Verner CholLeonard, Deborah S, CNM as Referring Physician (Certified Nurse Midwife) Bernette RedbirdBuccini, Robert, MD as Consulting Physician (Gastroenterology) Jodi GeraldsGraves, John, MD as Consulting Physician (Orthopedic Surgery) Magrinat, Valentino HueGustav C,  MD as Consulting Physician (Oncology)  Indicate any recent Medical Services you may have received from other than [facility] providers in the past year (date may be approximate).     Assessment:   This is a routine wellness examination for [place].  Hearing/Vision screen No exam data present  Dietary issues and exercise activities discussed: Current Exercise Habits: Home exercise routine, Type of exercise: walking, Time (Minutes): 30, Frequency (Times/Week): 5, Weekly Exercise (Minutes/Week): 150, Intensity: Moderate, Exercise limited by: orthopedic condition(s)  Goals      Patient Stated     To maintain my current health status by continuing to eat healthy, stay physically active and socially active.       Depression Screen PHQ 2/9 Scores 06/20/2020  PHQ -  2 Score 0    Fall Risk Fall Risk  06/20/2020  Falls in the past year? 0  Number falls in past yr: 0  Injury with Fall? 0  Risk for fall due to : No Fall Risks  Follow up Falls evaluation completed    FALL RISK PREVENTION PERTAINING TO THE HOME:  Any stairs in or around the home? Yes  If so, are there any without handrails? No  Home free of loose throw rugs in walkways, pet beds, electrical cords, etc? Yes  Adequate lighting in your home to reduce risk of falls? Yes   ASSISTIVE DEVICES UTILIZED TO PREVENT FALLS:  Life alert? No  Use of a cane, walker or w/c? No  Grab bars in the bathroom? No  Shower chair or bench in shower? No  Elevated toilet seat or a handicapped toilet? Yes   TIMED UP AND GO:  Was the test performed? No .  Length of time to ambulate 10 feet: 0 sec.   Gait steady and fast without use of assistive device  Cognitive Function: Normal cognitive status assessed by direct observation by this Nurse Health Advisor. No abnormalities found.          Immunizations Immunization History  Administered Date(s) Administered   Influenza,inj,Quad PF,6+ Mos 04/19/2013, 11/24/2013   PFIZER(Purple  Top)SARS-COV-2 Vaccination 04/29/2019, 05/20/2019, 12/30/2019   Pneumococcal Conjugate-13 07/30/2016, 08/23/2017   Tdap 04/19/2013   Zoster Recombinat (Shingrix) 08/02/2016, 08/23/2017    TDAP status: Up to date  Flu Vaccine status: Declined, Education has been provided regarding the importance of this vaccine but patient still declined. Advised may receive this vaccine at local pharmacy or Health Dept. Aware to provide a copy of the vaccination record if obtained from local pharmacy or Health Dept. Verbalized acceptance and understanding.  Pneumococcal vaccine status: Due, Education has been provided regarding the importance of this vaccine. Advised may receive this vaccine at local pharmacy or Health Dept. Aware to provide a copy of the vaccination record if obtained from local pharmacy or Health Dept. Verbalized acceptance and understanding.  Covid-19 vaccine status: Completed vaccines  Qualifies for Shingles Vaccine? Yes   Zostavax completed No   Shingrix Completed?: Yes  Screening Tests Health Maintenance  Topic Date Due   Hepatitis C Screening  Never done   PNA vac Low Risk Adult (2 of 2 - PPSV23) 08/24/2018   INFLUENZA VACCINE  10/24/2019   COLONOSCOPY (Pts 45-67yrs Insurance coverage will need to be confirmed)  02/27/2021   MAMMOGRAM  04/04/2021   TETANUS/TDAP  04/20/2023   DEXA SCAN  Completed   COVID-19 Vaccine  Completed   HPV VACCINES  Aged Out    Health Maintenance  Health Maintenance Due  Topic Date Due   Hepatitis C Screening  Never done   PNA vac Low Risk Adult (2 of 2 - PPSV23) 08/24/2018   INFLUENZA VACCINE  10/24/2019    Colorectal cancer screening: Type of screening: Colonoscopy. Completed 08/25/2017. Repeat every 10 years  Mammogram status: Completed 04/05/2019. Repeat every year  Bone Density status: Completed 08/07/2016. Results reflect: Bone density results: NORMAL. Repeat every 0 years.  Lung Cancer Screening: (Low Dose CT Chest recommended if Age  64-80 years, 30 pack-year currently smoking OR have quit w/in 15years.) does not qualify.   Lung Cancer Screening Referral: no  Additional Screening:  Hepatitis C Screening: does qualify; Completed no  Vision Screening: Recommended annual ophthalmology exams for early detection of glaucoma and other disorders of the eye. Is the patient  up to date with their annual eye exam?  Yes  Who is the provider or what is the name of the office in which the patient attends annual eye exams? [facility] If pt is not established with a provider, would they like to be referred to a provider to establish care? No . Information on [facility] given to patient.   Dental Screening: Recommended annual dental exams for proper oral hygiene  Community Resource Referral / Chronic Care Management: CRR required this visit?  No   CCM required this visit?  No      Plan:     I have personally reviewed and noted the following in the patient's chart:   Medical and social history Use of alcohol, tobacco or illicit drugs  Current medications and supplements Functional ability and status Nutritional status Physical activity Advanced directives List of other physicians Hospitalizations, surgeries, and ER visits in previous 12 months Vitals Screenings to include cognitive, depression, and falls Referrals and appointments  In addition, I have reviewed and discussed with patient certain preventive protocols, quality metrics, and best practice recommendations. A written personalized care plan for preventive services as well as general preventive health recommendations were provided to patient.     Mickeal Needy, LPN   [id number]   Nurse Notes:  Medications reviewed with patient; no opioid use noted.   Medical screening examination/treatment/procedure(s) were performed by non-physician practitioner and as supervising physician I was immediately available for  consultation/collaboration.  I agree with above. Jacinta Shoe, MD

## 2020-06-20 NOTE — Patient Instructions (Signed)
Kristen Nielsen , Thank you for taking time to come for your Medicare Wellness Visit. I appreciate your ongoing commitment to your health goals. Please review the following plan we discussed and let me know if I can assist you in the future.   Screening recommendations/referrals: Colonoscopy: 08/25/2017; due every 5-10 years Mammogram: 04/05/2019; due every 1-2 years Bone Density: 08/07/2016; results: normal (completed) Recommended yearly ophthalmology/optometry visit for glaucoma screening and checkup Recommended yearly dental visit for hygiene and checkup  Vaccinations: Influenza vaccine: declined Pneumococcal vaccine: 08/23/2017; need Pneumovax 23 Tdap vaccine: 04/19/2013; due every 10 years Shingles vaccine: 08/02/2016, 08/23/2017   Covid-19: 04/29/2019, 05/20/2019, 12/30/2019  Advanced directives: Advance directive discussed with you today. Even though you declined this today please call our office should you change your mind and we can give you the proper paperwork for you to fill out.  Conditions/risks identified: Yes; Reviewed health maintenance screenings with patient today and relevant education, vaccines, and/or referrals were provided. Please continue to do your personal lifestyle choices by: daily care of teeth and gums, regular physical activity (goal should be 5 days a week for 30 minutes), eat a healthy diet, avoid tobacco and drug use, limiting any alcohol intake, taking a low-dose aspirin (if not allergic or have been advised by your provider otherwise) and taking vitamins and minerals as recommended by your provider. Continue doing brain stimulating activities (puzzles, reading, adult coloring books, staying active) to keep memory sharp. Continue to eat heart healthy diet (full of fruits, vegetables, whole grains, lean protein, water--limit salt, fat, and sugar intake) and increase physical activity as tolerated.  Next appointment: Please schedule your next Medicare Wellness Visit with your Nurse  Health Advisor in 1 year.  Preventive Care 21 Years and Older, Female Preventive care refers to lifestyle choices and visits with your health care provider that can promote health and wellness. What does preventive care include?  A yearly physical exam. This is also called an annual well check.  Dental exams once or twice a year.  Routine eye exams. Ask your health care provider how often you should have your eyes checked.  Personal lifestyle choices, including:  Daily care of your teeth and gums.  Regular physical activity.  Eating a healthy diet.  Avoiding tobacco and drug use.  Limiting alcohol use.  Practicing safe sex.  Taking low-dose aspirin every day.  Taking vitamin and mineral supplements as recommended by your health care provider. What happens during an annual well check? The services and screenings done by your health care provider during your annual well check will depend on your age, overall health, lifestyle risk factors, and family history of disease. Counseling  Your health care provider may ask you questions about your:  Alcohol use.  Tobacco use.  Drug use.  Emotional well-being.  Home and relationship well-being.  Sexual activity.  Eating habits.  History of falls.  Memory and ability to understand (cognition).  Work and work Astronomer.  Reproductive health. Screening  You may have the following tests or measurements:  Height, weight, and BMI.  Blood pressure.  Lipid and cholesterol levels. These may be checked every 5 years, or more frequently if you are over 32 years old.  Skin check.  Lung cancer screening. You may have this screening every year starting at age 76 if you have a 30-pack-year history of smoking and currently smoke or have quit within the past 15 years.  Fecal occult blood test (FOBT) of the stool. You may have this test  every year starting at age 81.  Flexible sigmoidoscopy or colonoscopy. You may have a  sigmoidoscopy every 5 years or a colonoscopy every 10 years starting at age 33.  Hepatitis C blood test.  Hepatitis B blood test.  Sexually transmitted disease (STD) testing.  Diabetes screening. This is done by checking your blood sugar (glucose) after you have not eaten for a while (fasting). You may have this done every 1-3 years.  Bone density scan. This is done to screen for osteoporosis. You may have this done starting at age 76.  Mammogram. This may be done every 1-2 years. Talk to your health care provider about how often you should have regular mammograms. Talk with your health care provider about your test results, treatment options, and if necessary, the need for more tests. Vaccines  Your health care provider may recommend certain vaccines, such as:  Influenza vaccine. This is recommended every year.  Tetanus, diphtheria, and acellular pertussis (Tdap, Td) vaccine. You may need a Td booster every 10 years.  Zoster vaccine. You may need this after age 54.  Pneumococcal 13-valent conjugate (PCV13) vaccine. One dose is recommended after age 33.  Pneumococcal polysaccharide (PPSV23) vaccine. One dose is recommended after age 16. Talk to your health care provider about which screenings and vaccines you need and how often you need them. This information is not intended to replace advice given to you by your health care provider. Make sure you discuss any questions you have with your health care provider. Document Released: 04/07/2015 Document Revised: 11/29/2015 Document Reviewed: 01/10/2015 Elsevier Interactive Patient Education  2017 [place] Prevention in the Home Falls can cause injuries. They can happen to people of all ages. There are many things you can do to make your home safe and to help prevent falls. What can I do on the outside of my home?  Regularly fix the edges of walkways and driveways and fix any cracks.  Remove anything that might make you  trip as you walk through a door, such as a raised step or threshold.  Trim any bushes or trees on the path to your home.  Use bright outdoor lighting.  Clear any walking paths of anything that might make someone trip, such as rocks or tools.  Regularly check to see if handrails are loose or broken. Make sure that both sides of any steps have handrails.  Any raised decks and porches should have guardrails on the edges.  Have any leaves, snow, or ice cleared regularly.  Use sand or salt on walking paths during winter.  Clean up any spills in your garage right away. This includes oil or grease spills. What can I do in the bathroom?  Use night lights.  Install grab bars by the toilet and in the tub and shower. Do not use towel bars as grab bars.  Use non-skid mats or decals in the tub or shower.  If you need to sit down in the shower, use a plastic, non-slip stool.  Keep the floor dry. Clean up any water that spills on the floor as soon as it happens.  Remove soap buildup in the tub or shower regularly.  Attach bath mats securely with double-sided non-slip rug tape.  Do not have throw rugs and other things on the floor that can make you trip. What can I do in the bedroom?  Use night lights.  Make sure that you have a light by your bed that is easy to reach.  Do not use any sheets or blankets that are too big for your bed. They should not hang down onto the floor.  Have a firm chair that has side arms. You can use this for support while you get dressed.  Do not have throw rugs and other things on the floor that can make you trip. What can I do in the kitchen?  Clean up any spills right away.  Avoid walking on wet floors.  Keep items that you use a lot in easy-to-reach places.  If you need to reach something above you, use a strong step stool that has a grab bar.  Keep electrical cords out of the way.  Do not use floor polish or wax that makes floors slippery. If  you must use wax, use non-skid floor wax.  Do not have throw rugs and other things on the floor that can make you trip. What can I do with my stairs?  Do not leave any items on the stairs.  Make sure that there are handrails on both sides of the stairs and use them. Fix handrails that are broken or loose. Make sure that handrails are as long as the stairways.  Check any carpeting to make sure that it is firmly attached to the stairs. Fix any carpet that is loose or worn.  Avoid having throw rugs at the top or bottom of the stairs. If you do have throw rugs, attach them to the floor with carpet tape.  Make sure that you have a light switch at the top of the stairs and the bottom of the stairs. If you do not have them, ask someone to add them for you. What else can I do to help prevent falls?  Wear shoes that:  Do not have high heels.  Have rubber bottoms.  Are comfortable and fit you well.  Are closed at the toe. Do not wear sandals.  If you use a stepladder:  Make sure that it is fully opened. Do not climb a closed stepladder.  Make sure that both sides of the stepladder are locked into place.  Ask someone to hold it for you, if possible.  Clearly mark and make sure that you can see:  Any grab bars or handrails.  First and last steps.  Where the edge of each step is.  Use tools that help you move around (mobility aids) if they are needed. These include:  Canes.  Walkers.  Scooters.  Crutches.  Turn on the lights when you go into a dark area. Replace any light bulbs as soon as they burn out.  Set up your furniture so you have a clear path. Avoid moving your furniture around.  If any of your floors are uneven, fix them.  If there are any pets around you, be aware of where they are.  Review your medicines with your doctor. Some medicines can make you feel dizzy. This can increase your chance of falling. Ask your doctor what other things that you can do to  help prevent falls. This information is not intended to replace advice given to you by your health care provider. Make sure you discuss any questions you have with your health care provider. Document Released: 01/05/2009 Document Revised: 08/17/2015 Document Reviewed: 04/15/2014 Elsevier Interactive Patient Education  2017 [employer].

## 2020-06-27 ENCOUNTER — Other Ambulatory Visit: Payer: Self-pay | Admitting: Internal Medicine

## 2020-07-25 ENCOUNTER — Other Ambulatory Visit: Payer: Self-pay

## 2020-07-25 ENCOUNTER — Inpatient Hospital Stay: Payer: Medicare PPO | Attending: Oncology

## 2020-07-25 VITALS — BP 131/60 | HR 59 | Temp 99.0°F | Resp 16

## 2020-07-25 DIAGNOSIS — D510 Vitamin B12 deficiency anemia due to intrinsic factor deficiency: Secondary | ICD-10-CM | POA: Diagnosis not present

## 2020-07-25 MED ORDER — CYANOCOBALAMIN 1000 MCG/ML IJ SOLN
INTRAMUSCULAR | Status: AC
  Filled 2020-07-25: qty 1

## 2020-07-25 MED ORDER — CYANOCOBALAMIN 1000 MCG/ML IJ SOLN
1000.0000 ug | Freq: Once | INTRAMUSCULAR | Status: AC
  Administered 2020-07-25: 1000 ug via INTRAMUSCULAR

## 2020-07-25 NOTE — Patient Instructions (Signed)

## 2020-08-01 ENCOUNTER — Ambulatory Visit
Admission: RE | Admit: 2020-08-01 | Discharge: 2020-08-01 | Disposition: A | Discharge status: Home / Self Care | Payer: Medicare PPO | Source: Ambulatory Visit | Attending: Internal Medicine | Admitting: Internal Medicine

## 2020-08-01 ENCOUNTER — Other Ambulatory Visit: Payer: Self-pay

## 2020-08-01 ENCOUNTER — Ambulatory Visit: Payer: Medicare PPO

## 2020-08-01 DIAGNOSIS — Z1231 Encounter for screening mammogram for malignant neoplasm of breast: Secondary | ICD-10-CM

## 2020-08-03 ENCOUNTER — Other Ambulatory Visit: Payer: Self-pay | Admitting: Internal Medicine

## 2020-08-03 DIAGNOSIS — R928 Other abnormal and inconclusive findings on diagnostic imaging of breast: Secondary | ICD-10-CM

## 2020-08-21 IMAGING — MG DIGITAL SCREENING BILAT W/ TOMO W/ CAD
6 of 10 series · 6 of 30 positions shown · non-contrast
Comparison: Previous exam(s).

CLINICAL DATA: Screening.

EXAM:
DIGITAL SCREENING BILATERAL MAMMOGRAM WITH TOMO AND CAD

[R MLO synth-2D (1 of 2)]
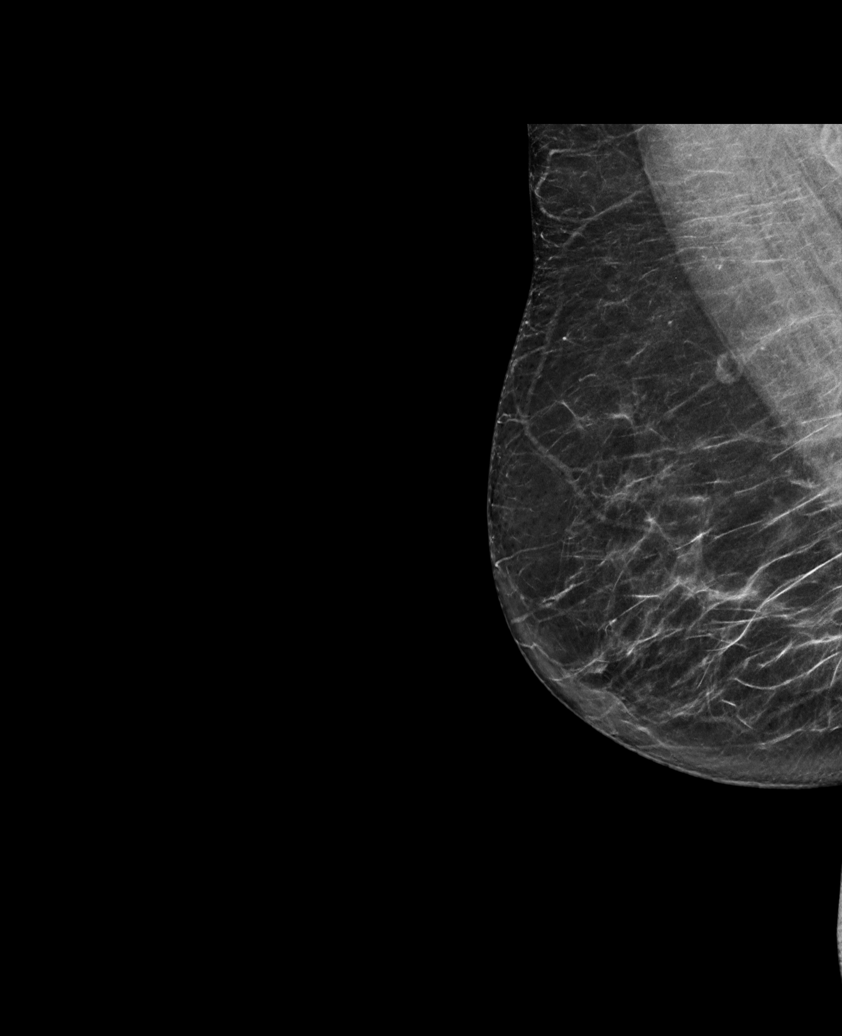

[L CC synth-2D]
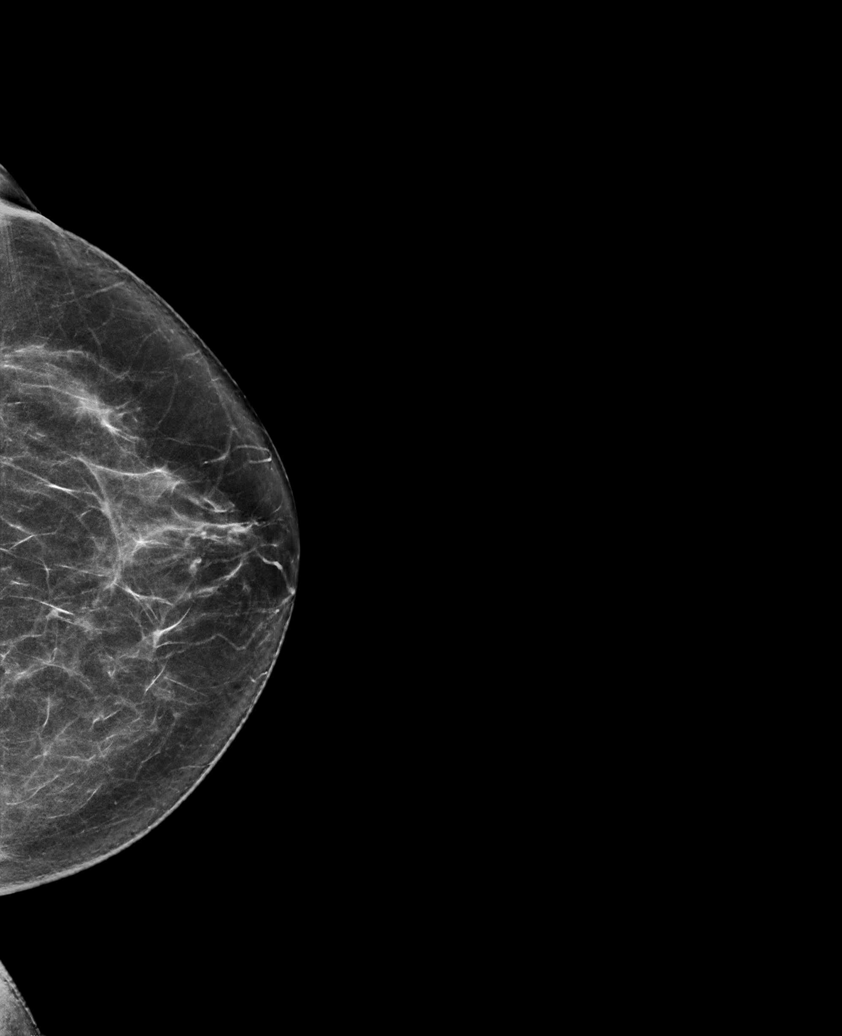

[R MLO synth-2D (2 of 2)]
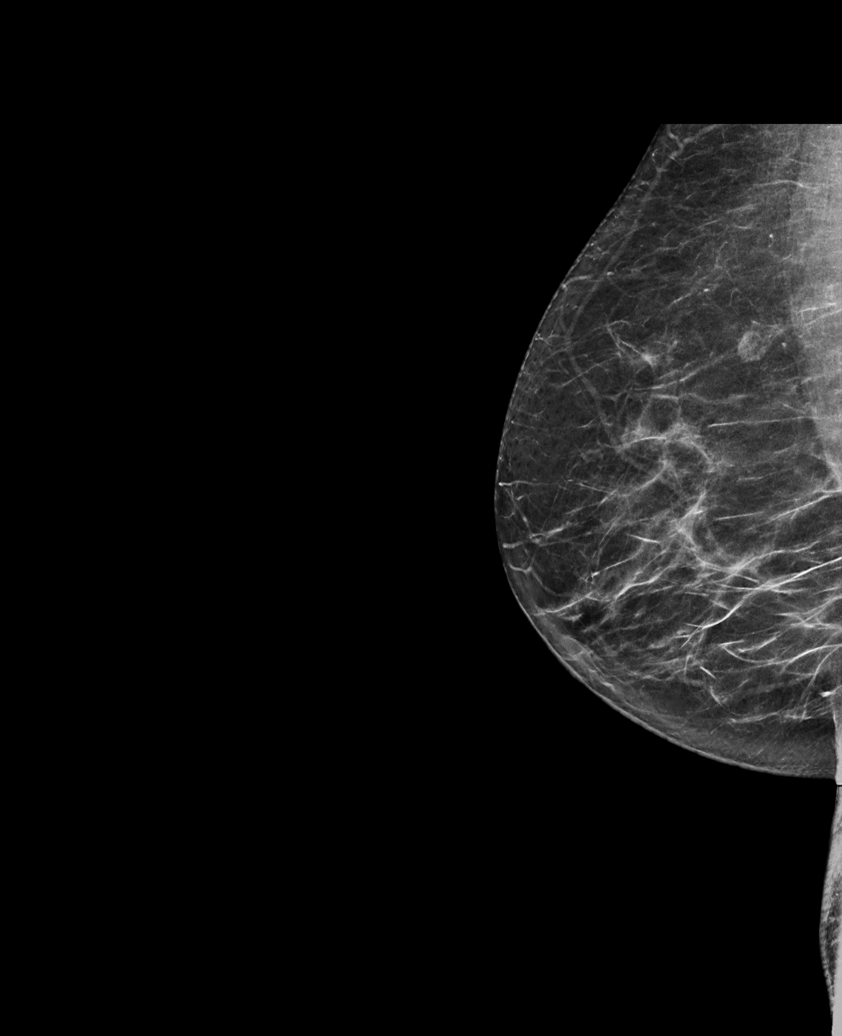

[L MLO synth-2D]
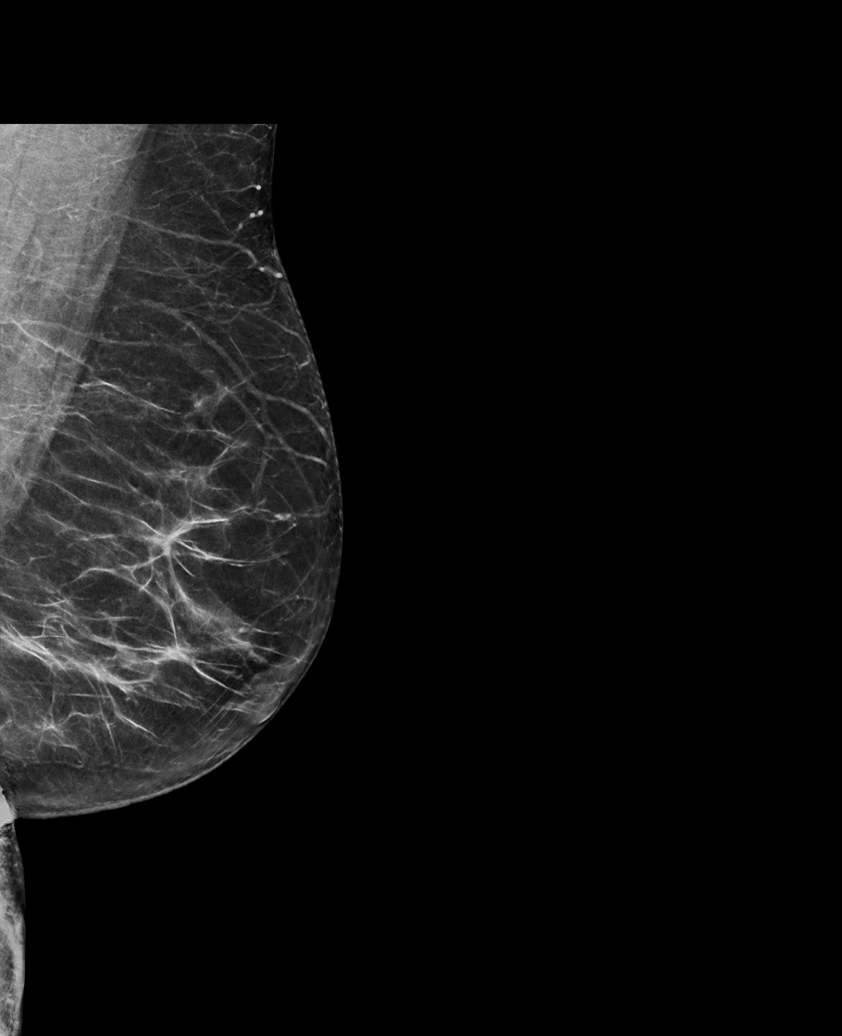

[R CC synth-2D]
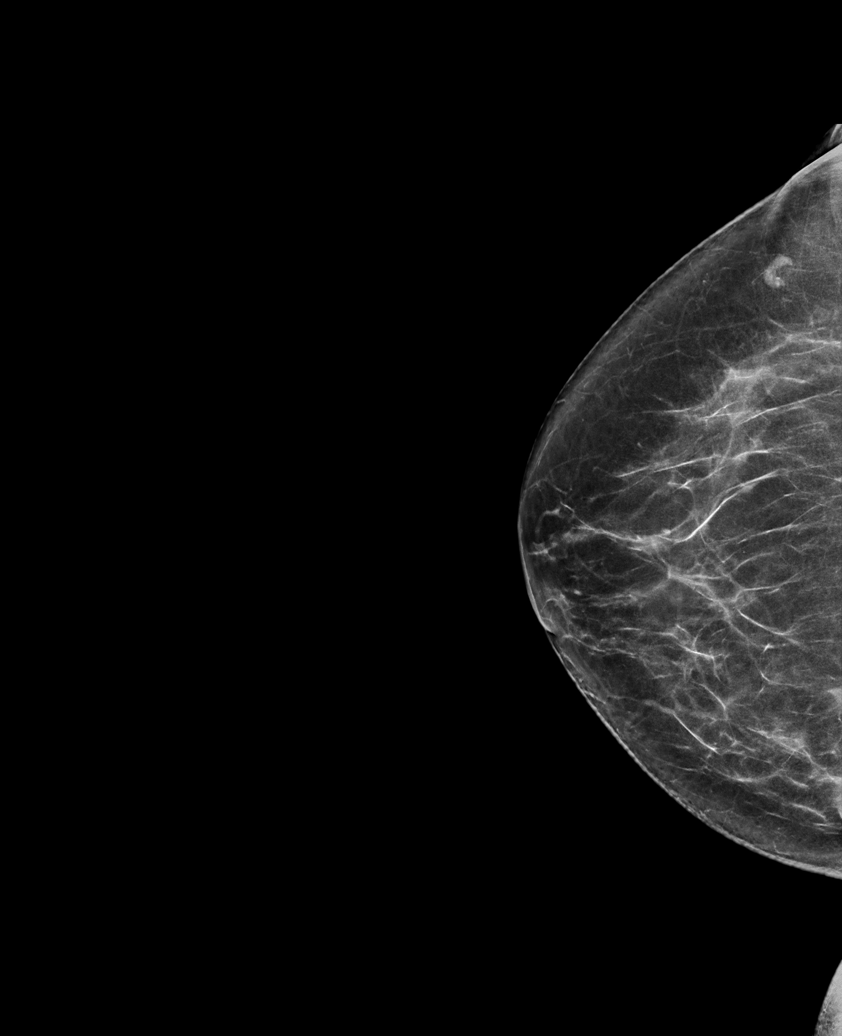

[L MLO tomo · tomo slice 36/71.0]
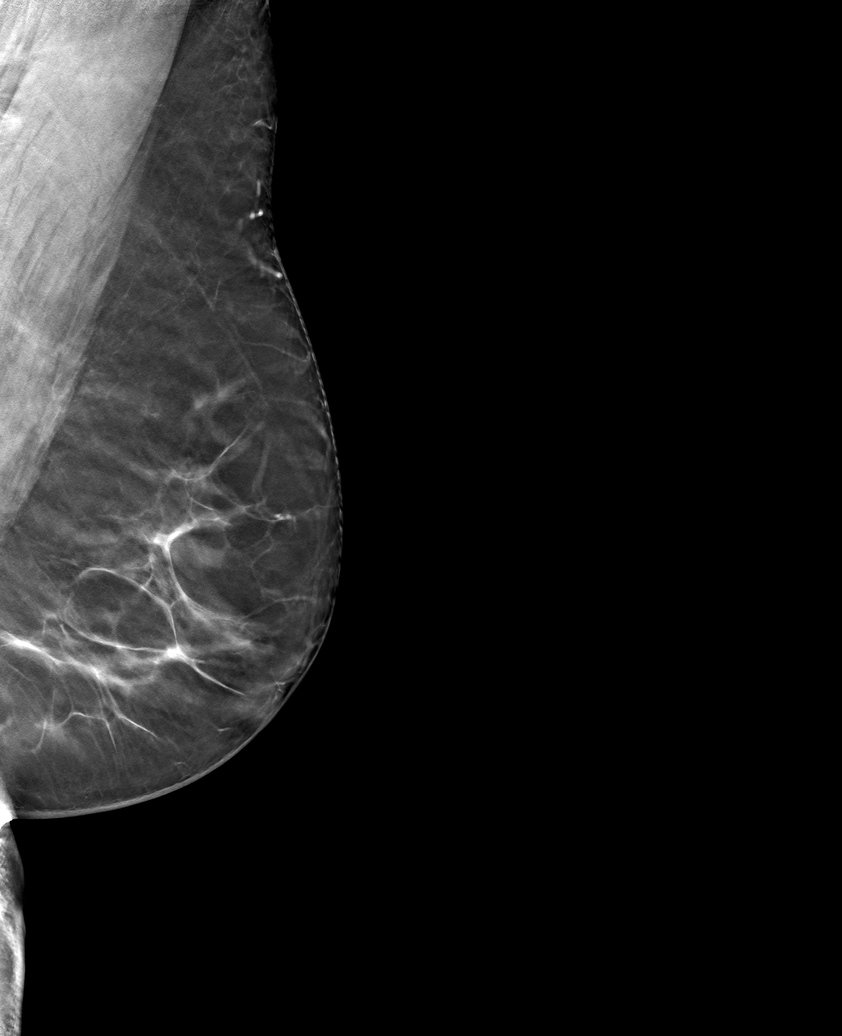

[6 of 30 positions shown; findings below may reference images not displayed]

ACR Breast Density Category b: There are scattered areas of
fibroglandular density.
FINDINGS: There are no findings suspicious for malignancy. Images were
processed with CAD.
IMPRESSION: No mammographic evidence of malignancy. A result letter of this
screening mammogram will be mailed directly to the patient.

RECOMMENDATION:
Screening mammogram in one year. (Code:[id number])

BI-RADS CATEGORY  1: Negative.

## 2020-08-22 ENCOUNTER — Ambulatory Visit (INDEPENDENT_AMBULATORY_CARE_PROVIDER_SITE_OTHER)
Admission: RE | Admit: 2020-08-22 | Discharge: 2020-08-22 | Disposition: A | Discharge status: Home / Self Care | Payer: Self-pay | Source: Ambulatory Visit | Attending: Internal Medicine | Admitting: Internal Medicine

## 2020-08-22 ENCOUNTER — Other Ambulatory Visit: Payer: Self-pay

## 2020-08-22 DIAGNOSIS — E785 Hyperlipidemia, unspecified: Secondary | ICD-10-CM

## 2020-08-24 ENCOUNTER — Other Ambulatory Visit: Payer: Self-pay | Admitting: Internal Medicine

## 2020-08-24 ENCOUNTER — Other Ambulatory Visit: Payer: Self-pay

## 2020-08-24 ENCOUNTER — Ambulatory Visit
Admission: RE | Admit: 2020-08-24 | Discharge: 2020-08-24 | Disposition: A | Discharge status: Home / Self Care | Payer: Medicare PPO | Source: Ambulatory Visit | Attending: Internal Medicine | Admitting: Internal Medicine

## 2020-08-24 ENCOUNTER — Ambulatory Visit: Payer: Medicare PPO

## 2020-08-24 ENCOUNTER — Encounter: Payer: Self-pay | Admitting: Oncology

## 2020-08-24 DIAGNOSIS — R922 Inconclusive mammogram: Secondary | ICD-10-CM | POA: Diagnosis not present

## 2020-08-24 DIAGNOSIS — R928 Other abnormal and inconclusive findings on diagnostic imaging of breast: Secondary | ICD-10-CM

## 2020-08-29 DIAGNOSIS — Z8249 Family history of ischemic heart disease and other diseases of the circulatory system: Secondary | ICD-10-CM | POA: Diagnosis not present

## 2020-08-29 DIAGNOSIS — G8929 Other chronic pain: Secondary | ICD-10-CM | POA: Diagnosis not present

## 2020-08-29 DIAGNOSIS — E538 Deficiency of other specified B group vitamins: Secondary | ICD-10-CM | POA: Diagnosis not present

## 2020-08-29 DIAGNOSIS — Z791 Long term (current) use of non-steroidal anti-inflammatories (NSAID): Secondary | ICD-10-CM | POA: Diagnosis not present

## 2020-08-29 DIAGNOSIS — R030 Elevated blood-pressure reading, without diagnosis of hypertension: Secondary | ICD-10-CM | POA: Diagnosis not present

## 2020-08-29 DIAGNOSIS — Z803 Family history of malignant neoplasm of breast: Secondary | ICD-10-CM | POA: Diagnosis not present

## 2020-08-29 DIAGNOSIS — Z7982 Long term (current) use of aspirin: Secondary | ICD-10-CM | POA: Diagnosis not present

## 2020-08-29 DIAGNOSIS — M1990 Unspecified osteoarthritis, unspecified site: Secondary | ICD-10-CM | POA: Diagnosis not present

## 2020-09-05 ENCOUNTER — Inpatient Hospital Stay: Payer: Medicare PPO | Attending: Oncology

## 2020-09-05 ENCOUNTER — Other Ambulatory Visit: Payer: Self-pay

## 2020-09-05 VITALS — BP 139/79 | HR 65 | Resp 18

## 2020-09-05 DIAGNOSIS — D510 Vitamin B12 deficiency anemia due to intrinsic factor deficiency: Secondary | ICD-10-CM | POA: Diagnosis not present

## 2020-09-05 MED ORDER — CYANOCOBALAMIN 1000 MCG/ML IJ SOLN
1000.0000 ug | Freq: Once | INTRAMUSCULAR | Status: AC
Start: 2020-09-05 — End: 2020-09-05
  Administered 2020-09-05: 1000 ug via INTRAMUSCULAR

## 2020-09-05 MED ORDER — CYANOCOBALAMIN 1000 MCG/ML IJ SOLN
INTRAMUSCULAR | Status: AC
  Filled 2020-09-05: qty 1

## 2020-09-05 NOTE — Patient Instructions (Signed)
Cyanocobalamin, Vitamin B12 injection O que  este medicamento? A CIANOCOBALAMINA  uma forma sinttica de vitamina B12. A vitamina B12  essencial para o desenvolvimento de glbulos sanguneos, clulas nervosas e protenas saudveis pelo organismo. Tambm ajuda no metabolismo das gorduras e carboidratos. Este medicamento  usado para tratar pessoas que no conseguem absorver vitamina B12 suficiente. Este medicamento pode ser usado para outros propsitos; em caso de dvidas, pergunte ao seu profissional de sade ou farmacutico. NOMES DE MARCAS COMUNS: B-12 Compliance Kit, B-12 Injection Kit, Cyomin, LA-12, Nutri-Twelve, Physicians EZ Use B-12, Primabalt O que devo dizer a meu profissional de sade antes de tomar este medicamento? Precisam saber se voc tem algum dos seguintes problemas ou estados de sade: doenas renais sndrome ou doena de Leber anemia megaloblstica reao estranha ou alergia  cianocobalamina ou ao cobalto reao estranha ou alergia a outros medicamentos, alimentos, corantes ou conservantes est grvida ou tentando engravidar est amamentando Como devo usar este medicamento? Este medicamento  injetado por via intramuscular ou por injeo subcutnea profunda. Costuma ser administrado por um profissional de sade em consultrio ou Chief of Staff. Porm,  possvel que seu mdico lhe ensine como aplicar suas prprias injees. Siga todas as instrues. Fale com seu pediatra a respeito do uso deste medicamento em crianas. Pode ser preciso tomar alguns cuidados especiais. Superdosagem: Se achar que tomou uma superdosagem deste medicamento, entre em contato imediatamente com o [facility] ou v a [employer]. OBSERVAO: Este medicamento  s para voc. No compartilhe este medicamento com outras pessoas. E se eu deixar de tomar uma dose? Se toma o medicamento em uma clnica ou no consultrio do seu mdico, ligue para Paramedic a Passenger transport manager. Se aplica as  injees por conta prpria e perdeu uma dose, tome-a assim que possvel. Se j estiver quase na hora da sua prxima dose, tome somente essa dose. No tome o remdio em dobro, nem tome uma dose adicional. O que pode interagir com este medicamento? colchicina consumo pesado de lcool Esta lista pode no descrever todas as interaes possveis. D ao seu profissional de sade uma lista de todos os medicamentos, ervas medicinais, remdios de venda livre, ou suplementos alimentares que voc [place]. Diga tambm se voc fuma, bebe, ou [place] drogas ilcitas. Alguns destes podem interagir com o seu medicamento. Ao que devo ficar atento quando estiver [employer] medicamento? Consulte seu mdico ou profissional de sade para acompanhamento regular Museum/gallery curator. Voc precisar fazer exames de sangue peridicos enquanto estiver [employer]. Voc pode precisar seguir uma dieta especial. Fale com seu mdico. Para conseguir o mximo benefcio deste medicamento, limite o seu consumo de lcool e evite fumar. Que efeitos colaterais posso sentir aps usar este medicamento? Efeitos colaterais que devem ser informados ao seu mdico ou profissional de sade o mais rpido possvel: reaes alrgicas, como erupo na pele, coceira, urticria, ou inchao do rosto, dos lbios ou da lngua pele azulada dor ou aperto no peito chiado no peito ou dificuldade para respirar tontura rea vermelha, inchada e dolorosa na perna Efeitos colaterais que normalmente no precisam de cuidados mdicos (avise ao seu mdico ou profissional de sade se persistirem ou forem incmodos): diarreia dor de cabea Esta lista pode no descrever todos os efeitos colaterais possveis. Para mais orientaes sobre efeitos colaterais, consulte o seu mdico. Voc pode relatar a ocorrncia de efeitos colaterais  FDA pelo telefone [phone]. Onde devo guardar meu medicamento? [facility] fora do [employer]. Conservar em [employer], entre 15 e 30 degreesC (  59 e 86 degreesF). Proteger Administrator, arts. Descartar qualquer medicamento no utilizado aps a data de validade impressa no rtulo ou embalagem. OBSERVAO: Este folheto  um resumo. Pode no cobrir todas as informaes possveis. Se tiver dvidas a respeito deste medicamento, fale com seu mdico, farmacutico ou profissional de sade.  2021 Elsevier/Gold Standard (2009-12-13 00:00:00)

## 2020-10-11 DIAGNOSIS — H25811 Combined forms of age-related cataract, right eye: Secondary | ICD-10-CM | POA: Diagnosis not present

## 2020-10-11 DIAGNOSIS — H40013 Open angle with borderline findings, low risk, bilateral: Secondary | ICD-10-CM | POA: Diagnosis not present

## 2020-10-11 DIAGNOSIS — H04123 Dry eye syndrome of bilateral lacrimal glands: Secondary | ICD-10-CM | POA: Diagnosis not present

## 2020-10-17 ENCOUNTER — Inpatient Hospital Stay: Payer: Medicare PPO | Attending: Oncology

## 2020-11-28 ENCOUNTER — Inpatient Hospital Stay: Payer: Medicare PPO | Attending: Oncology

## 2020-11-28 ENCOUNTER — Other Ambulatory Visit: Payer: Self-pay

## 2020-11-28 DIAGNOSIS — D510 Vitamin B12 deficiency anemia due to intrinsic factor deficiency: Secondary | ICD-10-CM | POA: Insufficient documentation

## 2020-11-28 MED ORDER — CYANOCOBALAMIN 1000 MCG/ML IJ SOLN
1000.0000 ug | Freq: Once | INTRAMUSCULAR | Status: AC
  Administered 2020-11-28: 1000 ug via INTRAMUSCULAR
  Filled 2020-11-28: qty 1

## 2020-12-04 DIAGNOSIS — Z800 Family history of malignant neoplasm of digestive organs: Secondary | ICD-10-CM | POA: Diagnosis not present

## 2020-12-04 DIAGNOSIS — D123 Benign neoplasm of transverse colon: Secondary | ICD-10-CM | POA: Diagnosis not present

## 2020-12-04 DIAGNOSIS — Z86010 Personal history of colonic polyps: Secondary | ICD-10-CM | POA: Diagnosis not present

## 2020-12-06 DIAGNOSIS — D123 Benign neoplasm of transverse colon: Secondary | ICD-10-CM | POA: Diagnosis not present

## 2020-12-11 DIAGNOSIS — Z96652 Presence of left artificial knee joint: Secondary | ICD-10-CM | POA: Diagnosis not present

## 2020-12-11 DIAGNOSIS — Z471 Aftercare following joint replacement surgery: Secondary | ICD-10-CM | POA: Diagnosis not present

## 2020-12-26 NOTE — Progress Notes (Signed)
[facility]  [phone] [phone]   ID: [name]   DOB: 29-Feb-1951  MR#: [id number]  VQM#:[id number]  Patient Care Team: Kristen Garter, MD as PCP - General (Internal Medicine) Kristen Nielsen, CNM as Referring Physician (Certified Nurse Midwife) Kristen Redbird, MD as Consulting Physician (Gastroenterology) Kristen Geralds, MD as Consulting Physician (Orthopedic Surgery) Kristen Nielsen, Valentino Hue, MD as Consulting Physician (Oncology) OTHER MD:  CHIEF COMPLAINT: anemia  CURRENT TREATMENT: B-12 supplementation   INTERVAL HISTORY: Kristen Nielsen returns today for follow-up and treatment of her pernicious anemia.  She is currently receiving her shots every 6 weeks--when she was receiving the injections every 8weeks she felt a little bit weaker and just changing to every 6 weeks made an enormous difference as far as she is concerned.  Since her last visit, she had screening mammogram on 08/01/2020 showing a possible left breast abnormality. She proceeded to left diagnostic mammogram on 08/24/2020 showing: breast density category B; no evidence of malignancy.  She had her colonoscopy under Buccini September 2022 and repeat is expected in 5 years.  She also had a coronary calcium score of 0 08/22/2020  REVIEW OF SYSTEMS: Kristen Nielsen has taken up golf.  She also walks, does her lawn, and has a QB for exercise.  A detailed review of systems today was otherwise entirely benign.   COVID 19 VACCINATION STATUS: Pfizer x3   HISTORY OF PRESENT ILLNESS: From the original summary note:  The patient was diagnosed with pernicious anemia in 1994-07-31. She started to receive her B12 shots here since she did not have a primary care physician. She has established herself in Dr. 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 Her father has a history of prostate cancer. The patient has one sister, who was diagnosed with breast cancer the age of 40. She is now doing well. She has one brother who died from colon cancer in 07-31-15.   GYNECOLOGIC HISTORY: GX P2, first live birth age 32. Change of life age 60.    SOCIAL HISTORY: (Updated October 2022). She is a retired Freight forwarder. She is divorced and lives by herself. Her 34 year old grandson who has autism and works for [employer] has purchased his own condo and she is very proud of him.. Her daughter lives in [place], [place], where the patient's son-in-law works as Heritage manager; the patient's son lives here in town. Kristen Nielsen has 4 grandchildren in total and attends a local [address].    ADVANCED DIRECTIVES: Not in place   HEALTH MAINTENANCE: Social History   Tobacco  Use   Smoking status: Never   Smokeless tobacco: Never  Substance Use Topics   Alcohol use: Yes    Alcohol/week: 0.0 - 2.0 standard drinks    Comment: occ   Drug use: No      No Known Allergies  Current Outpatient Medications  Medication Sig Dispense Refill   acetaminophen (TYLENOL) 500 MG tablet Take 500 mg by mouth daily as needed for moderate pain.     Cholecalciferol (VITAMIN D) 50 MCG (2000 UT) tablet Take 2 tablets (4,000 Units total) by mouth daily. 100 tablet 11   conjugated  estrogens (PREMARIN) vaginal cream 1/2 GM VAGINALLY TWICE WEEKLY 30 g 2   cyanocobalamin (COBAL-1000) 1000 MCG/ML injection Inject 1 mL (1,000 mcg total) into the muscle every 30 (thirty) days. (Patient taking differently: Inject 1,000 mcg into the muscle See admin instructions. Every other month) 10 mL 6   meloxicam (MOBIC) 15 MG tablet TAKE 1 TABLET (15 MG TOTAL) BY MOUTH DAILY. 30 tablet 5   No current facility-administered medications for this visit.    OBJECTIVE: African-American woman who appears younger than stated age Vitals:   12/27/20 0943  BP: (!) 126/52  Pulse: 65  Resp: 18  Temp: (!) 97.3 F (36.3 C)  SpO2: 96%    Wt Readings from Last 3 Encounters:  12/27/20 187 lb 7 oz (85 kg)  06/20/20 186 lb 3.2 oz (84.5 kg)  05/25/20 186 lb 12.8 oz (84.7 kg)   Body mass index is 31.19 kg/m.    ECOG FS:1 - Symptomatic but completely ambulatory  Sclerae unicteric, EOMs intact Wearing a mask No cervical or supraclavicular adenopathy Lungs no rales or rhonchi Heart regular rate and rhythm Abd soft, nontender, positive bowel sounds MSK no focal spinal tenderness, no upper extremity lymphedema Neuro: nonfocal, well oriented, appropriate affect Breasts: Deferred   LAB RESULTS: Lab Results  Component Value Date   WBC 8.4 12/27/2020   NEUTROABS 6.0 12/27/2020   HGB 12.4 12/27/2020   HCT 38.7 12/27/2020   MCV 83.8 12/27/2020   PLT 228 12/27/2020      Chemistry      Component Value Date/Time   NA 139 05/25/2020 1525   NA 142 10/03/2014 1046      Component Value Date/Time   CALCIUM 9.8 05/25/2020 1525   CALCIUM 9.9 10/03/2014 1046       No results found for: LABCA2  No components found for: [id number]  No results for input(s): INR in the last 168 hours.  Urinalysis    Component Value Date/Time   COLORURINE YELLOW 05/25/2020 1525    STUDIES: No results found.   ASSESSMENT: 70 y.o. [place] woman with history of pernicious anemia diagnosed in 1996  with a positive intrinsic factor for blocking antibody.  Receives B12 injections every 6 weeks   PLAN:  Kristen Nielsen is doing fine in general, with very up-to-date health maintenance.  She will receive her B12 injection today and every 6weeks and then see [place] again in 1year.  She knows to call for any other issue that may develop before the next visit.  Total encounter time 20 minutes.*   Jesiah Yerby, Valentino Hue, MD  12/27/20 10:11 AM Medical Oncology and Hematology [facility] [address] [place], [place] [address] Tel. [phone]    Fax. [phone]   I, Mickie Bail, am acting as scribe for Dr. Valentino Hue. Kristen Nielsen.  I, Ruthann Cancer MD, have reviewed the above documentation for accuracy and completeness, and I agree with the  above.   *Total Encounter Time as defined by the Centers for Medicare and Medicaid Services includes, in addition to the face-to-face time of a patient visit (documented in the note above) non-face-to-face time: obtaining and reviewing outside history, ordering and reviewing medications, tests or procedures, care coordination (communications with other health care professionals or caregivers) and documentation in the medical record.

## 2020-12-27 ENCOUNTER — Other Ambulatory Visit: Payer: Self-pay

## 2020-12-27 ENCOUNTER — Inpatient Hospital Stay: Payer: Medicare PPO | Attending: Oncology | Admitting: Oncology

## 2020-12-27 ENCOUNTER — Inpatient Hospital Stay: Payer: Medicare PPO

## 2020-12-27 VITALS — BP 126/52 | HR 65 | Temp 97.3°F | Resp 18 | Wt 187.4 lb

## 2020-12-27 DIAGNOSIS — Z79899 Other long term (current) drug therapy: Secondary | ICD-10-CM | POA: Diagnosis not present

## 2020-12-27 DIAGNOSIS — D510 Vitamin B12 deficiency anemia due to intrinsic factor deficiency: Secondary | ICD-10-CM

## 2020-12-27 LAB — CBC WITH DIFFERENTIAL/PLATELET
Abs Immature Granulocytes: 0.03 10*3/uL (ref 0.00–0.07)
Basophils Absolute: 0.1 10*3/uL (ref 0.0–0.1)
Basophils Relative: 1 %
Eosinophils Absolute: 0.3 10*3/uL (ref 0.0–0.5)
Eosinophils Relative: 3 %
HCT: 38.7 % (ref 36.0–46.0)
Hemoglobin: 12.4 g/dL (ref 12.0–15.0)
Immature Granulocytes: 0 %
Lymphocytes Relative: 15 %
Lymphs Abs: 1.2 10*3/uL (ref 0.7–4.0)
MCH: 26.8 pg (ref 26.0–34.0)
MCHC: 32 g/dL (ref 30.0–36.0)
MCV: 83.8 fL (ref 80.0–100.0)
Monocytes Absolute: 0.8 10*3/uL (ref 0.1–1.0)
Monocytes Relative: 10 %
Neutro Abs: 6 10*3/uL (ref 1.7–7.7)
Neutrophils Relative %: 71 %
Platelets: 228 10*3/uL (ref 150–400)
RBC: 4.62 MIL/uL (ref 3.87–5.11)
RDW: 13.7 % (ref 11.5–15.5)
WBC: 8.4 10*3/uL (ref 4.0–10.5)
nRBC: 0 % (ref 0.0–0.2)

## 2020-12-27 LAB — TSH: TSH: 0.774 u[IU]/mL (ref 0.308–3.960)

## 2020-12-27 MED ORDER — CYANOCOBALAMIN 1000 MCG/ML IJ SOLN
1000.0000 ug | Freq: Once | INTRAMUSCULAR | Status: AC
  Administered 2020-12-27: 1000 ug via INTRAMUSCULAR
  Filled 2020-12-27: qty 1

## 2020-12-27 NOTE — Patient Instructions (Signed)

## 2021-01-12 DIAGNOSIS — H25811 Combined forms of age-related cataract, right eye: Secondary | ICD-10-CM | POA: Diagnosis not present

## 2021-01-23 DIAGNOSIS — H2512 Age-related nuclear cataract, left eye: Secondary | ICD-10-CM | POA: Diagnosis not present

## 2021-01-26 DIAGNOSIS — H52222 Regular astigmatism, left eye: Secondary | ICD-10-CM | POA: Diagnosis not present

## 2021-01-26 DIAGNOSIS — H25812 Combined forms of age-related cataract, left eye: Secondary | ICD-10-CM | POA: Diagnosis not present

## 2021-01-26 DIAGNOSIS — H52221 Regular astigmatism, right eye: Secondary | ICD-10-CM | POA: Diagnosis not present

## 2021-02-07 ENCOUNTER — Other Ambulatory Visit: Payer: Self-pay

## 2021-02-07 ENCOUNTER — Inpatient Hospital Stay: Payer: Medicare PPO | Attending: Oncology

## 2021-02-07 DIAGNOSIS — D510 Vitamin B12 deficiency anemia due to intrinsic factor deficiency: Secondary | ICD-10-CM | POA: Diagnosis not present

## 2021-02-07 MED ORDER — CYANOCOBALAMIN 1000 MCG/ML IJ SOLN
1000.0000 ug | Freq: Once | INTRAMUSCULAR | Status: AC
  Administered 2021-02-07: 1000 ug via INTRAMUSCULAR
  Filled 2021-02-07: qty 1

## 2021-03-08 DIAGNOSIS — H5211 Myopia, right eye: Secondary | ICD-10-CM | POA: Diagnosis not present

## 2021-03-08 DIAGNOSIS — Z961 Presence of intraocular lens: Secondary | ICD-10-CM | POA: Diagnosis not present

## 2021-03-21 ENCOUNTER — Other Ambulatory Visit: Payer: Self-pay

## 2021-03-21 ENCOUNTER — Inpatient Hospital Stay: Payer: Medicare PPO | Attending: Oncology

## 2021-03-21 DIAGNOSIS — D510 Vitamin B12 deficiency anemia due to intrinsic factor deficiency: Secondary | ICD-10-CM | POA: Insufficient documentation

## 2021-03-21 MED ORDER — CYANOCOBALAMIN 1000 MCG/ML IJ SOLN
1000.0000 ug | Freq: Once | INTRAMUSCULAR | Status: AC
  Administered 2021-03-21: 11:00:00 1000 ug via INTRAMUSCULAR
  Filled 2021-03-21: qty 1

## 2021-03-21 NOTE — Patient Instructions (Signed)
Vitamin B12 Deficiency ?Vitamin B12 deficiency occurs when the body does not have enough vitamin B12, which is an important vitamin. The body needs this vitamin: ?To make red blood cells. ?To make DNA. This is the genetic material inside cells. ?To help the nerves work properly so they can carry messages from the brain to the body. ?Vitamin B12 deficiency can cause various health problems, such as a low red blood cell count (anemia) or nerve damage. ?What are the causes? ?This condition may be caused by: ?Not eating enough foods that contain vitamin B12. ?Not having enough stomach acid and digestive fluids to properly absorb vitamin B12 from the food that you eat. ?Certain digestive system diseases that make it hard to absorb vitamin B12. These diseases include Crohn's disease, chronic pancreatitis, and cystic fibrosis. ?A condition in which the body does not make enough of a protein (intrinsic factor), resulting in too few red blood cells (pernicious anemia). ?Having a surgery in which part of the stomach or small intestine is removed. ?Taking certain medicines that make it hard for the body to absorb vitamin B12. These medicines include: ?Heartburn medicines (antacids and proton pump inhibitors). ?Certain antibiotic medicines. ?Some medicines that are used to treat diabetes, tuberculosis, gout, or high cholesterol. ?What increases the risk? ?The following factors may make you more likely to develop a B12 deficiency: ?Being older than age 50. ?Eating a vegetarian or vegan diet, especially while you are pregnant. ?Eating a poor diet while you are pregnant. ?Taking certain medicines. ?Having alcoholism. ?What are the signs or symptoms? ?In some cases, there are no symptoms of this condition. If the condition leads to anemia or nerve damage, various symptoms can occur, such as: ?Weakness. ?Fatigue. ?Loss of appetite. ?Weight loss. ?Numbness or tingling in your hands and feet. ?Redness and burning of the  tongue. ?Confusion or memory problems. ?Depression. ?Sensory problems, such as color blindness, ringing in the ears, or loss of taste. ?Diarrhea or constipation. ?Trouble walking. ?If anemia is severe, symptoms can include: ?Shortness of breath. ?Dizziness. ?Rapid heart rate (tachycardia). ?How is this diagnosed? ?This condition may be diagnosed with a blood test to measure the level of vitamin B12 in your blood. You may also have other tests, including: ?A group of tests that measure certain characteristics of blood cells (complete blood count, CBC). ?A blood test to measure intrinsic factor. ?A procedure where a thin tube with a camera on the end is used to look into your stomach or intestines (endoscopy). ?Other tests may be needed to discover the cause of B12 deficiency. ?How is this treated? ?Treatment for this condition depends on the cause. This condition may be treated by: ?Changing your eating and drinking habits, such as: ?Eating more foods that contain vitamin B12. ?Drinking less alcohol or no alcohol. ?Getting vitamin B12 injections. ?Taking vitamin B12 supplements. Your health care provider will tell you which dosage is best for you. ?Follow these instructions at home: ?Eating and drinking ? ?Eat lots of healthy foods that contain vitamin B12, including: ?Meats and poultry. This includes beef, pork, chicken, turkey, and organ meats, such as liver. ?Seafood. This includes clams, rainbow trout, salmon, tuna, and haddock. ?Eggs. ?Cereal and dairy products that are fortified. This means that vitamin B12 has been added to the food. Check the label on the package to see if the food is fortified. ?The items listed above may not be a complete list of recommended foods and beverages. Contact a dietitian for more information. ?General instructions ?Get any   injections that are prescribed by your health care provider. ?Take supplements only as told by your health care provider. Follow the directions carefully. ?Do  not drink alcohol if your health care provider tells you not to. In some cases, you may only be asked to limit alcohol use. ?Keep all follow-up visits as told by your health care provider. This is important. ?Contact a health care provider if: ?Your symptoms come back. ?Get help right away if you: ?Develop shortness of breath. ?Have a rapid heart rate. ?Have chest pain. ?Become dizzy or lose consciousness. ?Summary ?Vitamin B12 deficiency occurs when the body does not have enough vitamin B12. ?The main causes of vitamin B12 deficiency include dietary deficiency, digestive diseases, pernicious anemia, and having a surgery in which part of the stomach or small intestine is removed. ?In some cases, there are no symptoms of this condition. If the condition leads to anemia or nerve damage, various symptoms can occur, such as weakness, shortness of breath, and numbness. ?Treatment may include getting vitamin B12 injections or taking vitamin B12 supplements. Eat lots of healthy foods that contain vitamin B12. ?This information is not intended to replace advice given to you by your health care provider. Make sure you discuss any questions you have with your health care provider. ?Document Revised: 09/06/2020 Document Reviewed: 11/18/2017 ?Elsevier Patient Education ? 2022 Elsevier Inc. ? ?

## 2021-05-02 ENCOUNTER — Inpatient Hospital Stay: Payer: Medicare PPO

## 2021-05-04 ENCOUNTER — Other Ambulatory Visit: Payer: Self-pay

## 2021-05-04 ENCOUNTER — Inpatient Hospital Stay: Payer: Medicare PPO | Attending: Oncology

## 2021-05-04 DIAGNOSIS — D510 Vitamin B12 deficiency anemia due to intrinsic factor deficiency: Secondary | ICD-10-CM | POA: Diagnosis not present

## 2021-05-04 MED ORDER — CYANOCOBALAMIN 1000 MCG/ML IJ SOLN
1000.0000 ug | Freq: Once | INTRAMUSCULAR | Status: AC
  Administered 2021-05-04: 1000 ug via INTRAMUSCULAR
  Filled 2021-05-04: qty 1

## 2021-06-13 ENCOUNTER — Inpatient Hospital Stay: Payer: Medicare PPO | Attending: Oncology

## 2021-07-02 ENCOUNTER — Ambulatory Visit: Payer: Medicare PPO

## 2021-07-02 ENCOUNTER — Telehealth: Payer: Self-pay

## 2021-07-02 NOTE — Telephone Encounter (Signed)
Called patient no answer no vm , patient may reschedule for next available appointment . ? ?L.Monik Lins,LPN  ?

## 2021-07-12 ENCOUNTER — Telehealth: Payer: Self-pay | Admitting: Hematology and Oncology

## 2021-07-12 NOTE — Telephone Encounter (Signed)
Per 4/19 in basket called and spoke to pt about reschedule of B12 injection.  Pt confirmed appointment  ?

## 2021-07-13 ENCOUNTER — Other Ambulatory Visit: Payer: Self-pay | Admitting: Hematology and Oncology

## 2021-07-16 ENCOUNTER — Inpatient Hospital Stay: Payer: Medicare PPO | Attending: Oncology

## 2021-07-16 ENCOUNTER — Other Ambulatory Visit: Payer: Self-pay

## 2021-07-16 DIAGNOSIS — D510 Vitamin B12 deficiency anemia due to intrinsic factor deficiency: Secondary | ICD-10-CM | POA: Insufficient documentation

## 2021-07-16 MED ORDER — CYANOCOBALAMIN 1000 MCG/ML IJ SOLN
1000.0000 ug | Freq: Once | INTRAMUSCULAR | Status: AC
  Administered 2021-07-16: 1000 ug via INTRAMUSCULAR
  Filled 2021-07-16: qty 1

## 2021-07-17 ENCOUNTER — Encounter: Payer: Self-pay | Admitting: Internal Medicine

## 2021-07-17 ENCOUNTER — Ambulatory Visit (INDEPENDENT_AMBULATORY_CARE_PROVIDER_SITE_OTHER): Payer: Medicare PPO | Admitting: Internal Medicine

## 2021-07-17 VITALS — BP 134/80 | HR 61 | Temp 98.3°F | Ht 65.0 in | Wt 185.0 lb

## 2021-07-17 DIAGNOSIS — D510 Vitamin B12 deficiency anemia due to intrinsic factor deficiency: Secondary | ICD-10-CM | POA: Diagnosis not present

## 2021-07-17 DIAGNOSIS — G8929 Other chronic pain: Secondary | ICD-10-CM | POA: Diagnosis not present

## 2021-07-17 DIAGNOSIS — M25562 Pain in left knee: Secondary | ICD-10-CM | POA: Diagnosis not present

## 2021-07-17 DIAGNOSIS — Z0000 Encounter for general adult medical examination without abnormal findings: Secondary | ICD-10-CM

## 2021-07-17 MED ORDER — MELOXICAM 15 MG PO TABS: 15.0000 mg | ORAL_TABLET | Freq: Every day | ORAL | 5 refills | Status: DC

## 2021-07-17 NOTE — Patient Instructions (Addendum)
Blue-Emu cream -- use 2-3 times a day ? ?

## 2021-07-17 NOTE — Progress Notes (Signed)
? ?Subjective:  ?Patient ID: Kristen Nielsen, female    DOB: 1950/05/11  Age: 71 y.o. MRN: [id number] ? ?CC: Physical (Patient states that she has loss her sense of smell.) ? ? ?HPI ?Kristen Nielsen presents for a well exam ? ?Outpatient Medications Prior to Visit  ?Medication Sig Dispense Refill  ? acetaminophen (TYLENOL) 500 MG tablet Take 500 mg by mouth daily as needed for moderate pain.    ? Cholecalciferol (VITAMIN D) 50 MCG (2000 UT) tablet Take 2 tablets (4,000 Units total) by mouth daily. 100 tablet 11  ? conjugated estrogens (PREMARIN) vaginal cream 1/2 GM VAGINALLY TWICE WEEKLY 30 g 2  ? cyanocobalamin (COBAL-1000) 1000 MCG/ML injection Inject 1 mL (1,000 mcg total) into the muscle every 30 (thirty) days. (Patient taking differently: Inject 1,000 mcg into the muscle See admin instructions. Every other month) 10 mL 6  ? meloxicam (MOBIC) 15 MG tablet TAKE 1 TABLET (15 MG TOTAL) BY MOUTH DAILY. 30 tablet 5  ? ?No facility-administered medications prior to visit.  ? ? ?ROS: ?Review of Systems  ?Constitutional:  Negative for activity change, appetite change, chills, fatigue and unexpected weight change.  ?HENT:  Negative for congestion, mouth sores and sinus pressure.   ?Eyes:  Negative for visual disturbance.  ?Respiratory:  Negative for cough and chest tightness.   ?Gastrointestinal:  Negative for abdominal pain and nausea.  ?Genitourinary:  Negative for difficulty urinating, frequency and vaginal pain.  ?Musculoskeletal:  Negative for back pain and gait problem.  ?Skin:  Negative for pallor and rash.  ?Neurological:  Negative for dizziness, tremors, weakness, numbness and headaches.  ?Psychiatric/Behavioral:  Negative for confusion and sleep disturbance.   ? ?Objective:  ?BP 134/80 (BP Location: Right Arm, Patient Position: Sitting, Cuff Size: Large)   Pulse 61   Temp 98.3 ?F (36.8 ?C) (Oral)   Ht 5\' 5"  (1.651 m)   Wt 185 lb (83.9 kg)   LMP 03/25/2008   SpO2 98%   BMI 30.79 kg/m?  ? ?BP Readings from  Last 3 Encounters:  ?07/17/21 134/80  ?12/27/20 (!) 126/52  ?09/05/20 139/79  ? ? ?Wt Readings from Last 3 Encounters:  ?07/17/21 185 lb (83.9 kg)  ?12/27/20 187 lb 7 oz (85 kg)  ?06/20/20 186 lb 3.2 oz (84.5 kg)  ? ? ?Physical Exam ?Constitutional:   ?   General: She is not in acute distress. ?   Appearance: She is well-developed.  ?HENT:  ?   Head: Normocephalic.  ?   Right Ear: External ear normal.  ?   Left Ear: External ear normal.  ?   Nose: Nose normal.  ?Eyes:  ?   General:     ?   Right eye: No discharge.     ?   Left eye: No discharge.  ?   Conjunctiva/sclera: Conjunctivae normal.  ?   Pupils: Pupils are equal, round, and reactive to light.  ?Neck:  ?   Thyroid: No thyromegaly.  ?   Vascular: No JVD.  ?   Trachea: No tracheal deviation.  ?Cardiovascular:  ?   Rate and Rhythm: Normal rate and regular rhythm.  ?   Heart sounds: Normal heart sounds.  ?Pulmonary:  ?   Effort: No respiratory distress.  ?   Breath sounds: No stridor. No wheezing.  ?Abdominal:  ?   General: Bowel sounds are normal. There is no distension.  ?   Palpations: Abdomen is soft. There is no mass.  ?   Tenderness: There is  no abdominal tenderness. There is no guarding or rebound.  ?Musculoskeletal:     ?   General: No tenderness.  ?   Cervical back: Normal range of motion and neck supple. No rigidity.  ?Lymphadenopathy:  ?   Cervical: No cervical adenopathy.  ?Skin: ?   Findings: No erythema or rash.  ?Neurological:  ?   Cranial Nerves: No cranial nerve deficit.  ?   Motor: No abnormal muscle tone.  ?   Coordination: Coordination normal.  ?   Deep Tendon Reflexes: Reflexes normal.  ?Psychiatric:     ?   Behavior: Behavior normal.     ?   Thought Content: Thought content normal.     ?   Judgment: Judgment normal.  ? ? ?Lab Results  ?Component Value Date  ? WBC 8.4 12/27/2020  ? HGB 12.4 12/27/2020  ? HCT 38.7 12/27/2020  ? PLT 228 12/27/2020  ? GLUCOSE 76 05/25/2020  ? CHOL 182 05/25/2020  ? TRIG 79.0 05/25/2020  ? HDL 61.00 05/25/2020  ?  LDLCALC 105 (H) 05/25/2020  ? ALT 16 05/25/2020  ? AST 19 05/25/2020  ? NA 139 05/25/2020  ? K 3.9 05/25/2020  ? CL 106 05/25/2020  ? CREATININE 0.80 05/25/2020  ? BUN 12 05/25/2020  ? CO2 28 05/25/2020  ? TSH 0.774 12/27/2020  ? INR 1.1 09/16/2018  ? ? ?MM DIAG BREAST TOMO UNI LEFT ? ?Result Date: 08/24/2020 ?CLINICAL DATA:  Patient recalled from screening for left breast asymmetry. EXAM: DIGITAL DIAGNOSTIC UNILATERAL LEFT MAMMOGRAM WITH TOMOSYNTHESIS AND CAD TECHNIQUE: Left digital diagnostic mammography and breast tomosynthesis was performed. The images were evaluated with computer-aided detection. COMPARISON:  Previous exam(s). ACR Breast Density Category b: There are scattered areas of fibroglandular density. FINDINGS: Questioned asymmetry outer left breast resolved with additional imaging compatible with dense overlapping fibroglandular tissue. No suspicious findings. IMPRESSION: No mammographic evidence for malignancy. RECOMMENDATION: Screening mammogram in one year.(Code:SM-B-01Y) I have discussed the findings and recommendations with the patient. If applicable, a reminder letter will be sent to the patient regarding the next appointment. BI-RADS CATEGORY  2: Benign. Electronically Signed   By: Annia Beltrew  Davis M.D.   On: 08/24/2020 11:18  ? ? ?Assessment & Plan:  ? ?Problem List Items Addressed This Visit   ? ? Pernicious anemia  ?  Chronic ?Check CBC, B12 ? ?  ?  ? Relevant Orders  ? Vitamin B12  ? Well adult exam - Primary  ?   ?We discussed age appropriate health related issues, including available/recomended screening tests and vaccinations. Labs were ordered to be later reviewed . All questions were answered. We discussed one or more of the following - seat belt use, use of sunscreen/sun exposure exercise, fall risk reduction, second hand smoke exposure, firearm use and storage, seat belt use, a need for adhering to healthy diet and exercise. ?Labs were ordered.  All questions were answered. ?Coronary CT  calcium score of 0 - 2022 ? ? ?  ?  ? Relevant Orders  ? TSH  ? Urinalysis  ? CBC with Differential/Platelet  ? Lipid panel  ? Comprehensive metabolic panel  ? Vitamin B12  ? Knee pain, left  ?  Blue-Emu cream was recommended to use 2-3 times a day ? ? ?  ?  ?  ? ? ?Meds ordered this encounter  ?Medications  ? meloxicam (MOBIC) 15 MG tablet  ?  Sig: Take 1 tablet (15 mg total) by mouth daily.  ?  Dispense:  30  tablet  ?  Refill:  5  ?  ? ? ?Follow-up: Return in about 1 year (around 07/18/2022) for Wellness Exam. ? ?Sonda Primes, MD ?

## 2021-07-17 NOTE — Assessment & Plan Note (Addendum)
Chronic ?Check CBC, B12 ?

## 2021-07-17 NOTE — Assessment & Plan Note (Signed)
?  We discussed age appropriate health related issues, including available/recomended screening tests and vaccinations. Labs were ordered to be later reviewed . All questions were answered. We discussed one or more of the following - seat belt use, use of sunscreen/sun exposure exercise, fall risk reduction, second hand smoke exposure, firearm use and storage, seat belt use, a need for adhering to healthy diet and exercise. ?Labs were ordered.  All questions were answered. ?Coronary CT calcium score of 0 - 2022 ? ? ?

## 2021-07-17 NOTE — Assessment & Plan Note (Signed)
Blue-Emu cream was recommended to use 2-3 times a day ? ?

## 2021-07-25 ENCOUNTER — Ambulatory Visit: Payer: Medicare PPO

## 2021-09-05 ENCOUNTER — Other Ambulatory Visit: Payer: Self-pay

## 2021-09-05 ENCOUNTER — Inpatient Hospital Stay: Payer: Medicare PPO | Attending: Oncology

## 2021-09-05 DIAGNOSIS — D510 Vitamin B12 deficiency anemia due to intrinsic factor deficiency: Secondary | ICD-10-CM | POA: Insufficient documentation

## 2021-09-05 MED ORDER — CYANOCOBALAMIN 1000 MCG/ML IJ SOLN
1000.0000 ug | Freq: Once | INTRAMUSCULAR | Status: AC
  Administered 2021-09-05: 1000 ug via INTRAMUSCULAR
  Filled 2021-09-05: qty 1

## 2021-09-06 DIAGNOSIS — H40013 Open angle with borderline findings, low risk, bilateral: Secondary | ICD-10-CM | POA: Diagnosis not present

## 2021-09-06 DIAGNOSIS — Z961 Presence of intraocular lens: Secondary | ICD-10-CM | POA: Diagnosis not present

## 2021-09-06 DIAGNOSIS — H04123 Dry eye syndrome of bilateral lacrimal glands: Secondary | ICD-10-CM | POA: Diagnosis not present

## 2021-10-17 ENCOUNTER — Inpatient Hospital Stay: Payer: Medicare PPO | Attending: Oncology

## 2021-10-17 ENCOUNTER — Other Ambulatory Visit: Payer: Self-pay

## 2021-10-17 DIAGNOSIS — D510 Vitamin B12 deficiency anemia due to intrinsic factor deficiency: Secondary | ICD-10-CM | POA: Insufficient documentation

## 2021-10-17 MED ORDER — CYANOCOBALAMIN 1000 MCG/ML IJ SOLN
1000.0000 ug | Freq: Once | INTRAMUSCULAR | Status: AC
  Administered 2021-10-17: 1000 ug via INTRAMUSCULAR
  Filled 2021-10-17: qty 1

## 2021-10-26 ENCOUNTER — Ambulatory Visit (INDEPENDENT_AMBULATORY_CARE_PROVIDER_SITE_OTHER): Payer: Medicare PPO

## 2021-10-26 DIAGNOSIS — Z0000 Encounter for general adult medical examination without abnormal findings: Secondary | ICD-10-CM | POA: Diagnosis not present

## 2021-10-26 DIAGNOSIS — Z1231 Encounter for screening mammogram for malignant neoplasm of breast: Secondary | ICD-10-CM

## 2021-10-26 NOTE — Patient Instructions (Signed)
Kristen Nielsen , Thank you for taking time to come for your Medicare Wellness Visit. I appreciate your ongoing commitment to your health goals. Please review the following plan we discussed and let me know if I can assist you in the future.   Screening recommendations/referrals: Colonoscopy: 12/04/2020 Mammogram: referral 10/26/2021 Bone Density: 08/07/2016 Recommended yearly ophthalmology/optometry visit for glaucoma screening and checkup Recommended yearly dental visit for hygiene and checkup  Vaccinations: Influenza vaccine: completed  Pneumococcal vaccine: completed  Tdap vaccine: 02/18/2015 Shingles vaccine: completed     Advanced directives: none   Conditions/risks identified: none   Next appointment: none    Preventive Care 65 Years and Older, Female Preventive care refers to lifestyle choices and visits with your health care provider that can promote health and wellness. What does preventive care include? A yearly physical exam. This is also called an annual well check. Dental exams once or twice a year. Routine eye exams. Ask your health care provider how often you should have your eyes checked. Personal lifestyle choices, including: Daily care of your teeth and gums. Regular physical activity. Eating a healthy diet. Avoiding tobacco and drug use. Limiting alcohol use. Practicing safe sex. Taking low-dose aspirin every day. Taking vitamin and mineral supplements as recommended by your health care provider. What happens during an annual well check? The services and screenings done by your health care provider during your annual well check will depend on your age, overall health, lifestyle risk factors, and family history of disease. Counseling  Your health care provider may ask you questions about your: Alcohol use. Tobacco use. Drug use. Emotional well-being. Home and relationship well-being. Sexual activity. Eating habits. History of falls. Memory and ability to  understand (cognition). Work and work Astronomer. Reproductive health. Screening  You may have the following tests or measurements: Height, weight, and BMI. Blood pressure. Lipid and cholesterol levels. These may be checked every 5 years, or more frequently if you are over 61 years old. Skin check. Lung cancer screening. You may have this screening every year starting at age 73 if you have a 30-pack-year history of smoking and currently smoke or have quit within the past 15 years. Fecal occult blood test (FOBT) of the stool. You may have this test every year starting at age 72. Flexible sigmoidoscopy or colonoscopy. You may have a sigmoidoscopy every 5 years or a colonoscopy every 10 years starting at age 58. Hepatitis C blood test. Hepatitis B blood test. Sexually transmitted disease (STD) testing. Diabetes screening. This is done by checking your blood sugar (glucose) after you have not eaten for a while (fasting). You may have this done every 1-3 years. Bone density scan. This is done to screen for osteoporosis. You may have this done starting at age 63. Mammogram. This may be done every 1-2 years. Talk to your health care provider about how often you should have regular mammograms. Talk with your health care provider about your test results, treatment options, and if necessary, the need for more tests. Vaccines  Your health care provider may recommend certain vaccines, such as: Influenza vaccine. This is recommended every year. Tetanus, diphtheria, and acellular pertussis (Tdap, Td) vaccine. You may need a Td booster every 10 years. Zoster vaccine. You may need this after age 23. Pneumococcal 13-valent conjugate (PCV13) vaccine. One dose is recommended after age 20. Pneumococcal polysaccharide (PPSV23) vaccine. One dose is recommended after age 35. Talk to your health care provider about which screenings and vaccines you need and how often  you need them. This information is not  intended to replace advice given to you by your health care provider. Make sure you discuss any questions you have with your health care provider. Document Released: 04/07/2015 Document Revised: 11/29/2015 Document Reviewed: 01/10/2015 Elsevier Interactive Patient Education  2017 [place] Prevention in the Home Falls can cause injuries. They can happen to people of all ages. There are many things you can do to make your home safe and to help prevent falls. What can I do on the outside of my home? Regularly fix the edges of walkways and driveways and fix any cracks. Remove anything that might make you trip as you walk through a door, such as a raised step or threshold. Trim any bushes or trees on the path to your home. Use bright outdoor lighting. Clear any walking paths of anything that might make someone trip, such as rocks or tools. Regularly check to see if handrails are loose or broken. Make sure that both sides of any steps have handrails. Any raised decks and porches should have guardrails on the edges. Have any leaves, snow, or ice cleared regularly. Use sand or salt on walking paths during winter. Clean up any spills in your garage right away. This includes oil or grease spills. What can I do in the bathroom? Use night lights. Install grab bars by the toilet and in the tub and shower. Do not use towel bars as grab bars. Use non-skid mats or decals in the tub or shower. If you need to sit down in the shower, use a plastic, non-slip stool. Keep the floor dry. Clean up any water that spills on the floor as soon as it happens. Remove soap buildup in the tub or shower regularly. Attach bath mats securely with double-sided non-slip rug tape. Do not have throw rugs and other things on the floor that can make you trip. What can I do in the bedroom? Use night lights. Make sure that you have a light by your bed that is easy to reach. Do not use any sheets or blankets that are  too big for your bed. They should not hang down onto the floor. Have a firm chair that has side arms. You can use this for support while you get dressed. Do not have throw rugs and other things on the floor that can make you trip. What can I do in the kitchen? Clean up any spills right away. Avoid walking on wet floors. Keep items that you use a lot in easy-to-reach places. If you need to reach something above you, use a strong step stool that has a grab bar. Keep electrical cords out of the way. Do not use floor polish or wax that makes floors slippery. If you must use wax, use non-skid floor wax. Do not have throw rugs and other things on the floor that can make you trip. What can I do with my stairs? Do not leave any items on the stairs. Make sure that there are handrails on both sides of the stairs and use them. Fix handrails that are broken or loose. Make sure that handrails are as long as the stairways. Check any carpeting to make sure that it is firmly attached to the stairs. Fix any carpet that is loose or worn. Avoid having throw rugs at the top or bottom of the stairs. If you do have throw rugs, attach them to the floor with carpet tape. Make sure that you have a light  switch at the top of the stairs and the bottom of the stairs. If you do not have them, ask someone to add them for you. What else can I do to help prevent falls? Wear shoes that: Do not have high heels. Have rubber bottoms. Are comfortable and fit you well. Are closed at the toe. Do not wear sandals. If you use a stepladder: Make sure that it is fully opened. Do not climb a closed stepladder. Make sure that both sides of the stepladder are locked into place. Ask someone to hold it for you, if possible. Clearly mark and make sure that you can see: Any grab bars or handrails. First and last steps. Where the edge of each step is. Use tools that help you move around (mobility aids) if they are needed. These  include: Canes. Walkers. Scooters. Crutches. Turn on the lights when you go into a dark area. Replace any light bulbs as soon as they burn out. Set up your furniture so you have a clear path. Avoid moving your furniture around. If any of your floors are uneven, fix them. If there are any pets around you, be aware of where they are. Review your medicines with your doctor. Some medicines can make you feel dizzy. This can increase your chance of falling. Ask your doctor what other things that you can do to help prevent falls. This information is not intended to replace advice given to you by your health care provider. Make sure you discuss any questions you have with your health care provider. Document Released: 01/05/2009 Document Revised: 08/17/2015 Document Reviewed: 04/15/2014 Elsevier Interactive Patient Education  2017 [employer].

## 2021-10-26 NOTE — Progress Notes (Addendum)
Subjective:   Kristen Nielsen is a 71 y.o. female who presents for Medicare Annual (Subsequent) preventive examination.   I connected with Wyatt Mage  today by telephone and verified that I am speaking with the correct person using two identifiers. Location patient: home Location provider: work Persons participating in the virtual visit: patient, provider.   I discussed the limitations, risks, security and privacy concerns of performing an evaluation and management service by telephone and the availability of in person appointments. I also discussed with the patient that there may be a patient responsible charge related to this service. The patient expressed understanding and verbally consented to this telephonic visit.    Interactive audio and video telecommunications were attempted between this provider and patient, however failed, due to patient having technical difficulties OR patient did not have access to video capability.  We continued and completed visit with audio only.    Review of Systems     Cardiac Risk Factors include: advanced age (>58men, >47 women)     Objective:    Today's Vitals   There is no height or weight on file to calculate BMI.     10/26/2021    9:27 AM 06/20/2020    2:47 PM 09/18/2018    4:00 PM 09/16/2018    1:36 PM 10/02/2015    1:24 PM  Advanced Directives  Does Patient Have a Medical Advance Directive? No No No No No  Would patient like information on creating a medical advance directive? No - Patient declined No - Patient declined No - Patient declined No - Guardian declined     Current Medications (verified) Outpatient Encounter Medications as of 10/26/2021  Medication Sig   acetaminophen (TYLENOL) 500 MG tablet Take 500 mg by mouth daily as needed for moderate pain.   Cholecalciferol (VITAMIN D) 50 MCG (2000 UT) tablet Take 2 tablets (4,000 Units total) by mouth daily.   conjugated estrogens (PREMARIN) vaginal cream 1/2 GM VAGINALLY TWICE  WEEKLY   cyanocobalamin (COBAL-1000) 1000 MCG/ML injection Inject 1 mL (1,000 mcg total) into the muscle every 30 (thirty) days. (Patient taking differently: Inject 1,000 mcg into the muscle See admin instructions. Every other month)   meloxicam (MOBIC) 15 MG tablet Take 1 tablet (15 mg total) by mouth daily.   No facility-administered encounter medications on file as of 10/26/2021.    Allergies (verified) Patient has no known allergies.   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   Procedure: TOTAL KNEE ARTHROPLASTY;  Surgeon: Jodi Geralds, MD;  Location: [facility];  Service: Orthopedics;  Laterality: Left;   Family History  Problem Relation Age of Onset   Hypertension Mother    Cancer Father        lung ca   Hypertension Father    Diabetes Father    Thyroid disease Father    Breast cancer Sister 26   Cancer Brother 19       col ca   Social History   Socioeconomic History   Marital status: Divorced    Spouse name: Not on file   Number of children: Not on file   Years of education: Not on file   Highest education level: Not on file  Occupational History   Not on  file  Tobacco Use   Smoking status: Never   Smokeless tobacco: Never  Substance and Sexual Activity   Alcohol use: Yes    Alcohol/week: 0.0 - 2.0 standard drinks of alcohol    Comment: occ   Drug use: No   Sexual activity: Not Currently    Partners: Male    Birth control/protection: Post-menopausal  Other Topics Concern   Not on file  Social History Narrative   Not on file   Social Determinants of Health   Financial Resource Strain: Low Risk  (10/26/2021)   Overall Financial Resource Strain (CARDIA)    Difficulty of Paying Living Expenses: Not hard at all  Food Insecurity: No Food Insecurity  (10/26/2021)   Hunger Vital Sign    Worried About Running Out of Food in the Last Year: Never true    Ran Out of Food in the Last Year: Never true  Transportation Needs: No Transportation Needs (10/26/2021)   PRAPARE - Administrator, Civil Service (Medical): No    Lack of Transportation (Non-Medical): No  Physical Activity: Insufficiently Active (10/26/2021)   Exercise Vital Sign    Days of Exercise per Week: 4 days    Minutes of Exercise per Session: 30 min  Stress: No Stress Concern Present (10/26/2021)   [employer] of Occupational Health - Occupational Stress Questionnaire    Feeling of Stress : Not at all  Social Connections: Moderately Integrated (10/26/2021)   Social Connection and Isolation Panel [NHANES]    Frequency of Communication with Friends and Family: Three times a week    Frequency of Social Gatherings with Friends and Family: Three times a week    Attends Religious Services: More than 4 times per year    Active Member of Clubs or Organizations: Yes    Attends Engineer, structural: More than 4 times per year    Marital Status: Divorced    Tobacco Counseling Counseling given: Not Answered   Clinical Intake:  Pre-visit preparation completed: Yes  Pain : No/denies pain     Nutritional Risks: None Diabetes: No  How often do you need to have someone help you when you read instructions, pamphlets, or other written materials from your doctor or pharmacy?: 1 - Never 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 0  Vision? 0  Difficulty concentrating or making decisions? 0  Walking or climbing stairs? 0  Dressing or bathing? 0  Doing errands, shopping? 0  Preparing Food and eating  ? N  Using the Toilet? N  In the past six months, have you accidently leaked urine? N  Do you have problems with loss of bowel control? N  Managing your Medications? N  Managing your Finances? N    Patient Care Team: Plotnikov, Georgina Quint, MD as PCP - General (Internal Medicine) Verner Chol, CNM as Referring Physician (Certified Nurse Midwife) Bernette Redbird, MD as Consulting Physician (Gastroenterology) Jodi Geralds, MD as Consulting Physician (Orthopedic Surgery) Magrinat, Valentino Hue, MD (Inactive) as Consulting Physician (Oncology)  Indicate any recent Medical Services you may have received from other than [facility] providers in the past year (date may be approximate).     Assessment:   This is a  routine wellness examination for [place].  Hearing/Vision screen Vision Screening - Comments:: Annual ey exams   Dietary issues and exercise activities discussed: Current Exercise Habits: Home exercise routine, Type of exercise: walking, Time (Minutes): 30, Frequency (Times/Week): 4, Weekly Exercise (Minutes/Week): 120, Intensity: Mild, Exercise limited by: None identified   Goals Addressed   None    Depression Screen    10/26/2021    9:27 AM 10/26/2021    9:25 AM 07/17/2021    7:53 AM 07/17/2021    7:52 AM 06/20/2020    2:41 PM  PHQ 2/9 Scores  PHQ - 2 Score 0 0 1 1 0  PHQ- 9 Score   4      Fall Risk    10/26/2021    9:27 AM 07/17/2021    7:54 AM 06/20/2020    2:47 PM  Fall Risk   Falls in the past year? 0 0 0  Number falls in past yr: 0  0  Injury with Fall? 0  0  Risk for fall due to :   No Fall Risks  Follow up Falls evaluation completed;Education provided  Falls evaluation completed    FALL RISK PREVENTION PERTAINING TO THE HOME:  Any stairs in or around the home? Yes  If so, are there any without handrails? No  Home free of loose throw rugs in walkways, pet beds, electrical cords, etc? Yes  Adequate lighting in your home to reduce risk of falls? Yes   ASSISTIVE  DEVICES UTILIZED TO PREVENT FALLS:  Life alert? No  Use of a cane, walker or w/c? No  Grab bars in the bathroom? No  Shower chair or bench in shower? Yes  Elevated toilet seat or a handicapped toilet? No     Cognitive Function:    Normal cognitive status assessed by telephone conversation by this Nurse Health Advisor. No abnormalities found.      10/26/2021    9:32 AM  6CIT Screen  What Year? 0 points  What month? 0 points  What time? 0 points  Count back from 20 0 points  Months in reverse 0 points  Repeat phrase 0 points  Total Score 0 points    Immunizations Immunization History  Administered Date(s) Administered   Influenza,inj,Quad PF,6+ Mos 04/19/2013, 11/24/2013   PFIZER(Purple Top)SARS-COV-2 Vaccination 04/29/2019, 05/20/2019, 12/30/2019   Pneumococcal Conjugate-13 07/30/2016, 08/23/2017   Tdap 04/19/2013   Zoster Recombinat (Shingrix) 08/02/2016, 08/23/2017    TDAP status: Up to date  Flu Vaccine status: Up to date  Pneumococcal vaccine status: Up to date  Covid-19 vaccine status: Completed vaccines  Qualifies for Shingles Vaccine? Yes   Zostavax completed Yes   Shingrix Completed?: Yes  Screening Tests Health Maintenance  Topic Date Due   Hepatitis C Screening  Never done   Pneumonia Vaccine 38+ Years old (2 - PPSV23 or PCV20) 08/24/2018   COVID-19 Vaccine (5 - Pfizer series) 07/09/2021   MAMMOGRAM  08/01/2021   INFLUENZA VACCINE  10/23/2021   TETANUS/TDAP  04/20/2023   COLONOSCOPY (Pts 45-73yrs Insurance coverage will need to be confirmed)  12/05/2030   DEXA SCAN  Completed   Zoster Vaccines- Shingrix  Completed   HPV VACCINES  Aged Out    Health Maintenance  Health Maintenance Due  Topic Date Due   Hepatitis C Screening  Never done   Pneumonia Vaccine 19+ Years old (2 - PPSV23 or PCV20) 08/24/2018   COVID-19 Vaccine (5 - Pfizer series) 07/09/2021   MAMMOGRAM  08/01/2021   INFLUENZA VACCINE  10/23/2021    Colorectal cancer screening:  Type of screening: Colonoscopy. Completed 12/04/2020. Repeat every 10 years // Mammogram status: Ordered 10/26/2021. Pt provided with contact info and advised to call to schedule appt.   Bone Density status: Completed 08/07/2016. Results reflect: Bone density results: OSTEOPENIA. Repeat every 5 years.  Lung Cancer Screening: (Low Dose CT Chest recommended if Age 3-80 years, 30 pack-year currently smoking OR have quit w/in 15years.) does not qualify.   Lung Cancer Screening Referral: n/a  Additional Screening:  Hepatitis C Screening: does not qualify;   Vision Screening: Recommended annual ophthalmology exams for early detection of glaucoma and other disorders of the eye. Is the patient up to date with their annual eye exam?  Yes  Who is the provider or what is the name of the office in which the patient attends annual eye exams? Dr.Groat  If pt is not established with a provider, would they like to be referred to a provider to establish care? No .   Dental Screening: Recommended annual dental exams for proper oral hygiene  Community Resource Referral / Chronic Care Management: CRR required this visit?  No   CCM required this visit?  No      Plan:     I have personally reviewed and noted the following in the patient's chart:   Medical and social history Use of alcohol, tobacco or illicit drugs  Current medications and supplements including opioid prescriptions.  Functional ability and status Nutritional status Physical activity Advanced directives List of other physicians Hospitalizations, surgeries, and ER visits in previous 12 months Vitals Screenings to include cognitive, depression, and falls Referrals and appointments  In addition, I have reviewed and discussed with patient certain preventive protocols, quality metrics, and best practice recommendations. A written personalized care plan for preventive services as well as general preventive health recommendations  were provided to patient.     Lorrene Reid, LPN   10/24/4233   Nurse Notes: none     Medical screening examination/treatment/procedure(s) were performed by non-physician practitioner and as supervising physician I was immediately available for consultation/collaboration.  I agree with above. Jacinta Shoe, MD

## 2021-11-22 ENCOUNTER — Ambulatory Visit
Admission: RE | Admit: 2021-11-22 | Discharge: 2021-11-22 | Disposition: A | Discharge status: Home / Self Care | Payer: Medicare PPO | Source: Ambulatory Visit | Attending: Internal Medicine | Admitting: Internal Medicine

## 2021-11-22 DIAGNOSIS — Z1231 Encounter for screening mammogram for malignant neoplasm of breast: Secondary | ICD-10-CM | POA: Diagnosis not present

## 2021-11-28 ENCOUNTER — Inpatient Hospital Stay: Payer: Medicare PPO | Attending: Oncology

## 2021-11-28 ENCOUNTER — Other Ambulatory Visit: Payer: Self-pay

## 2021-11-28 DIAGNOSIS — D510 Vitamin B12 deficiency anemia due to intrinsic factor deficiency: Secondary | ICD-10-CM | POA: Diagnosis not present

## 2021-11-28 MED ORDER — CYANOCOBALAMIN 1000 MCG/ML IJ SOLN
1000.0000 ug | Freq: Once | INTRAMUSCULAR | Status: AC
  Administered 2021-11-28: 1000 ug via INTRAMUSCULAR
  Filled 2021-11-28: qty 1

## 2022-01-09 ENCOUNTER — Inpatient Hospital Stay: Payer: Medicare PPO | Admitting: Hematology and Oncology

## 2022-01-09 ENCOUNTER — Other Ambulatory Visit: Payer: Self-pay

## 2022-01-09 ENCOUNTER — Inpatient Hospital Stay: Payer: Medicare PPO | Attending: Oncology

## 2022-01-09 ENCOUNTER — Other Ambulatory Visit: Payer: Self-pay | Admitting: Hematology and Oncology

## 2022-01-09 ENCOUNTER — Inpatient Hospital Stay: Payer: Medicare PPO

## 2022-01-09 VITALS — BP 141/73 | HR 60 | Temp 97.6°F | Resp 15 | Wt 187.3 lb

## 2022-01-09 DIAGNOSIS — D510 Vitamin B12 deficiency anemia due to intrinsic factor deficiency: Secondary | ICD-10-CM

## 2022-01-09 DIAGNOSIS — E538 Deficiency of other specified B group vitamins: Secondary | ICD-10-CM | POA: Diagnosis not present

## 2022-01-09 LAB — FOLATE: Folate: 16.6 ng/mL (ref >5.9)

## 2022-01-09 LAB — CBC WITH DIFFERENTIAL (CANCER CENTER ONLY)
Abs Immature Granulocytes: 0.03 10*3/uL (ref 0.00–0.07)
Basophils Absolute: 0 10*3/uL (ref 0.0–0.1)
Basophils Relative: 1 %
Eosinophils Absolute: 0.3 10*3/uL (ref 0.0–0.5)
Eosinophils Relative: 4 %
HCT: 39.6 % (ref 36.0–46.0)
Hemoglobin: 12.9 g/dL (ref 12.0–15.0)
Immature Granulocytes: 1 %
Lymphocytes Relative: 16 %
Lymphs Abs: 1 10*3/uL (ref 0.7–4.0)
MCH: 27.3 pg (ref 26.0–34.0)
MCHC: 32.6 g/dL (ref 30.0–36.0)
MCV: 83.7 fL (ref 80.0–100.0)
Monocytes Absolute: 0.6 10*3/uL (ref 0.1–1.0)
Monocytes Relative: 9 %
Neutro Abs: 4.3 10*3/uL (ref 1.7–7.7)
Neutrophils Relative %: 69 %
Platelet Count: 267 10*3/uL (ref 150–400)
RBC: 4.73 MIL/uL (ref 3.87–5.11)
RDW: 13.1 % (ref 11.5–15.5)
WBC Count: 6.2 10*3/uL (ref 4.0–10.5)
nRBC: 0 % (ref 0.0–0.2)

## 2022-01-09 LAB — CMP (CANCER CENTER ONLY)
ALT: 15 U/L (ref 0–44)
AST: 18 U/L (ref 15–41)
Albumin: 4.3 g/dL (ref 3.5–5.0)
Alkaline Phosphatase: 118 U/L (ref 38–126)
Anion gap: 4 — ABNORMAL LOW (ref 5–15)
BUN: 12 mg/dL (ref 8–23)
CO2: 30 mmol/L (ref 22–32)
Calcium: 10 mg/dL (ref 8.9–10.3)
Chloride: 106 mmol/L (ref 98–111)
Creatinine: 0.9 mg/dL (ref 0.44–1.00)
GFR, Estimated: >60 mL/min (ref >60)
Glucose, Bld: 95 mg/dL (ref 70–99)
Potassium: 4.3 mmol/L (ref 3.5–5.1)
Sodium: 140 mmol/L (ref 135–145)
Total Bilirubin: 0.6 mg/dL (ref 0.3–1.2)
Total Protein: 7.3 g/dL (ref 6.5–8.1)

## 2022-01-09 LAB — RETIC PANEL
Immature Retic Fract: 12.8 % (ref 2.3–15.9)
RBC.: 4.75 MIL/uL (ref 3.87–5.11)
Retic Count, Absolute: 53.7 10*3/uL (ref 19.0–186.0)
Retic Ct Pct: 1.1 % (ref 0.4–3.1)
Reticulocyte Hemoglobin: 29.3 pg (ref >27.9)

## 2022-01-09 LAB — VITAMIN B12: Vitamin B-12: 313 pg/mL (ref 180–914)

## 2022-01-09 LAB — FERRITIN: Ferritin: 56 ng/mL (ref 11–307)

## 2022-01-09 LAB — IRON AND IRON BINDING CAPACITY (CC-WL,HP ONLY)
Iron: 104 ug/dL (ref 28–170)
Saturation Ratios: 30 % (ref 10.4–31.8)
TIBC: 350 ug/dL (ref 250–450)
UIBC: 246 ug/dL (ref 148–442)

## 2022-01-09 MED ORDER — CYANOCOBALAMIN 1000 MCG/ML IJ SOLN
1000.0000 ug | Freq: Once | INTRAMUSCULAR | Status: AC
  Administered 2022-01-09: 1000 ug via INTRAMUSCULAR
  Filled 2022-01-09: qty 1

## 2022-01-09 NOTE — Progress Notes (Signed)
[facility] [phone]   [phone]  PROGRESS NOTE  Patient Care Team: Plotnikov, Evie Lacks, MD as PCP - General (Internal Medicine) Regina Eck, CNM as Referring Physician (Certified Nurse Midwife) Ronald Lobo, MD as Consulting Physician (Gastroenterology) Dorna Leitz, MD as Consulting Physician (Orthopedic Surgery) Magrinat, Virgie Dad, MD (Inactive) as Consulting Physician (Oncology)  Hematological/Oncological History # Vitamin B12 Deficiency  12/27/2020: last visit with Dr. Jana Hakim  01/09/2022: Transition care to Dr. Lorenso Courier   Interval History:  Kristen Nielsen 71 y.o. female with medical history significant for vitamin b12 deficiency who presents for a follow up visit. The patient's last visit was on 12/27/2020 with Dr. Jana Hakim. In the interim since the last visit she has continued on q 6 week IM B12 injections.   On exam today Kristen Nielsen notes that she has been receiving every 6 week injection that is due for her shot today.  She notes that she is tolerating them "pretty well".  She notes that over 1 year ago she felt tired and was receiving the shots every other month.  They increased to every 6 weeks and it has helped her feel better.  She reports that she does eat red meat but "not often".  She notes that she is not having any difficulties with lightheadedness or dizziness.  She notes that she does have headaches on occasion but otherwise has been asymptomatic.  She has had no other major issues lately.  She reports that her left leg and foot tend to have some issues with stiffness and numbness on occasion.  She did have a knee surgery in 2022 which she attributes the symptoms.  She otherwise denies any fevers, chills, sweats, nausea, vomiting or diarrhea.  A full 10 point ROS was otherwise negative.  MEDICAL HISTORY:  Past Medical History:  Diagnosis Date   Anemia    Arthritis    Environmental allergies    Vitamin B 12 deficiency      SURGICAL HISTORY: Past Surgical History:  Procedure Laterality Date   TONSILLECTOMY AND ADENOIDECTOMY     TOTAL KNEE ARTHROPLASTY Left 09/18/2018   Procedure: TOTAL KNEE ARTHROPLASTY;  Surgeon: Dorna Leitz, MD;  Location: [facility];  Service: Orthopedics;  Laterality: Left;    SOCIAL HISTORY: Social History   Socioeconomic History   Marital status: Divorced    Spouse name: Not on file   Number of children: Not on file   Years of education: Not on file   Highest education level: Not on file  Occupational History   Not on file  Tobacco Use   Smoking status: Never   Smokeless tobacco: Never  Substance and Sexual Activity   Alcohol use: Yes    Alcohol/week: 0.0 - 2.0 standard drinks of alcohol    Comment: occ   Drug use: No   Sexual activity: Not Currently    Partners: Male    Birth control/protection: Post-menopausal  Other Topics Concern   Not on file  Social History Narrative   Not on file   Social Determinants of Health   Financial Resource Strain: Low Risk  (10/26/2021)   Overall Financial Resource Strain (CARDIA)    Difficulty of Paying Living Expenses: Not hard at all  Food Insecurity: No Food Insecurity (10/26/2021)   Hunger Vital Sign    Worried About Running Out of Food in the Last Year: Never true    Ran Out of Food in the Last Year: Never true  Transportation Needs: No Transportation Needs (  10/26/2021)   PRAPARE - Administrator, Civil Service (Medical): No    Lack of Transportation (Non-Medical): No  Physical Activity: Insufficiently Active (10/26/2021)   Exercise Vital Sign    Days of Exercise per Week: 4 days    Minutes of Exercise per Session: 30 min  Stress: No Stress Concern Present (10/26/2021)   [employer] of Occupational Health - Occupational Stress Questionnaire    Feeling of Stress : Not at all  Social Connections: Moderately Integrated (10/26/2021)   Social Connection and Isolation Panel [NHANES]    Frequency of Communication with  Friends and Family: Three times a week    Frequency of Social Gatherings with Friends and Family: Three times a week    Attends Religious Services: More than 4 times per year    Active Member of Clubs or Organizations: Yes    Attends Banker Meetings: More than 4 times per year    Marital Status: Divorced  Intimate Partner Violence: Not At Risk (10/26/2021)   Humiliation, Afraid, Rape, and Kick questionnaire    Fear of Current or Ex-Partner: No    Emotionally Abused: No    Physically Abused: No    Sexually Abused: No    FAMILY HISTORY: Family History  Problem Relation Age of Onset   Hypertension Mother    Cancer Father        lung ca   Hypertension Father    Diabetes Father    Thyroid disease Father    Breast cancer Sister 1   Cancer Brother 21       col ca    ALLERGIES:  has No Known Allergies.  MEDICATIONS:  Current Outpatient Medications  Medication Sig Dispense Refill   acetaminophen (TYLENOL) 500 MG tablet Take 500 mg by mouth daily as needed for moderate pain.     Cholecalciferol (VITAMIN D) 50 MCG (2000 UT) tablet Take 2 tablets (4,000 Units total) by mouth daily. 100 tablet 11   conjugated estrogens (PREMARIN) vaginal cream 1/2 GM VAGINALLY TWICE WEEKLY 30 g 2   cyanocobalamin (COBAL-1000) 1000 MCG/ML injection Inject 1 mL (1,000 mcg total) into the muscle every 30 (thirty) days. (Patient taking differently: Inject 1,000 mcg into the muscle See admin instructions. Every other month) 10 mL 6   meloxicam (MOBIC) 15 MG tablet Take 1 tablet (15 mg total) by mouth daily. 30 tablet 5   No current facility-administered medications for this visit.    REVIEW OF SYSTEMS:   Constitutional: ( - ) fevers, ( - )  chills , ( - ) night sweats Eyes: ( - ) blurriness of vision, ( - ) double vision, ( - ) watery eyes Ears, nose, mouth, throat, and face: ( - ) mucositis, ( - ) sore throat Respiratory: ( - ) cough, ( - ) dyspnea, ( - ) wheezes Cardiovascular: ( - )  palpitation, ( - ) chest discomfort, ( - ) lower extremity swelling Gastrointestinal:  ( - ) nausea, ( - ) heartburn, ( - ) change in bowel habits Skin: ( - ) abnormal skin rashes Lymphatics: ( - ) new lymphadenopathy, ( - ) easy bruising Neurological: ( - ) numbness, ( - ) tingling, ( - ) new weaknesses Behavioral/Psych: ( - ) mood change, ( - ) new changes  All other systems were reviewed with the patient and are negative.  PHYSICAL EXAMINATION:  Vitals:   01/09/22 1123  BP: (!) 141/73  Pulse: 60  Resp: 15  Temp: 97.6 F (  36.4 C)  SpO2: 99%   Filed Weights   01/09/22 1123  Weight: 187 lb 4.8 oz (85 kg)    GENERAL: Well-appearing elderly African-American female, alert, no distress and comfortable SKIN: skin color, texture, turgor are normal, no rashes or significant lesions EYES: conjunctiva are pink and non-injected, sclera clear LUNGS: clear to auscultation and percussion with normal breathing effort HEART: regular rate & rhythm and no murmurs and no lower extremity edema Musculoskeletal: no cyanosis of digits and no clubbing  PSYCH: alert & oriented x 3, fluent speech NEURO: no focal motor/sensory deficits  LABORATORY DATA:  I have reviewed the data as listed    Latest Ref Rng & Units 01/09/2022   11:03 AM 12/27/2020    9:32 AM 05/25/2020    3:25 PM  CBC  WBC 4.0 - 10.5 K/uL 6.2  8.4  6.7   Hemoglobin 12.0 - 15.0 g/dL 91.6  38.4  66.5   Hematocrit 36.0 - 46.0 % 39.6  38.7  38.6   Platelets 150 - 400 K/uL 267  228  262.0        Latest Ref Rng & Units 01/09/2022   11:03 AM 05/25/2020    3:25 PM 09/16/2018    2:03 PM  CMP  Glucose 70 - 99 mg/dL 95  76  92   BUN 8 - 23 mg/dL 12  12  18    Creatinine 0.44 - 1.00 mg/dL  9.93  5.70   Sodium 135 - 145 mmol/L 140  139  139   Potassium 3.5 - 5.1 mmol/L 4.3  3.9  4.4   Chloride 98 - 111 mmol/L 106  106  106   CO2 22 - 32 mmol/L 30  28  26    Calcium 8.9 - 10.3 mg/dL 1.77  9.8  9.7   Total Protein 6.5 - 8.1 g/dL 7.3   7.1  7.4   Total Bilirubin 0.3 - 1.2 mg/dL 0.6  0.5  0.3   Alkaline Phos 38 - 126 U/L 118  110  120   AST 15 - 41 U/L 18  19  20    ALT 0 - 44 U/L 15  16  19      RADIOGRAPHIC STUDIES: No results found.  ASSESSMENT & PLAN Kristen Nielsen 71 y.o. female with medical history significant for vitamin b12 deficiency who presents for a follow up visit.  # Vitamin B12 Deficiency  -- Continue vitamin B12 injections 1000 mcg every 6 weeks. 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 -- Patient not currently having any concerning symptoms today.  Tolerating shots well. -- Labs show white blood cell count 6.2, hemoglobin 12.9, MCV 83.7, and platelets of 267  No orders of the defined types were placed in this encounter.   All questions were answered. The patient knows to call the clinic with any problems, questions or concerns.  A total of more than 40 minutes were spent on this encounter with face-to-face time and non-face-to-face time, including preparing to see the patient, ordering tests and/or medications, counseling the patient and coordination of care as outlined above.   , MD Department of Hematology/Oncology [facility] Phone: [phone] Pager: [phone] Email: [email or web address]  01/13/2022 8:17 PM

## 2022-01-11 LAB — METHYLMALONIC ACID, SERUM: Methylmalonic Acid, Quantitative: 142 nmol/L (ref 0–378)

## 2022-01-13 ENCOUNTER — Encounter: Payer: Self-pay | Admitting: Oncology

## 2022-02-20 ENCOUNTER — Inpatient Hospital Stay: Payer: Medicare PPO

## 2022-02-20 ENCOUNTER — Inpatient Hospital Stay: Payer: Medicare PPO | Attending: Oncology

## 2022-02-20 ENCOUNTER — Other Ambulatory Visit: Payer: Self-pay | Admitting: Hematology and Oncology

## 2022-02-20 DIAGNOSIS — D510 Vitamin B12 deficiency anemia due to intrinsic factor deficiency: Secondary | ICD-10-CM

## 2022-04-03 ENCOUNTER — Inpatient Hospital Stay: Payer: Medicare PPO | Attending: Oncology

## 2022-04-03 ENCOUNTER — Inpatient Hospital Stay: Payer: Medicare PPO

## 2022-04-03 ENCOUNTER — Other Ambulatory Visit: Payer: Self-pay

## 2022-04-03 DIAGNOSIS — E538 Deficiency of other specified B group vitamins: Secondary | ICD-10-CM | POA: Diagnosis not present

## 2022-04-03 DIAGNOSIS — D510 Vitamin B12 deficiency anemia due to intrinsic factor deficiency: Secondary | ICD-10-CM

## 2022-04-03 LAB — CBC WITH DIFFERENTIAL (CANCER CENTER ONLY)
Abs Immature Granulocytes: 0.03 10*3/uL (ref 0.00–0.07)
Basophils Absolute: 0 10*3/uL (ref 0.0–0.1)
Basophils Relative: 0 %
Eosinophils Absolute: 0.3 10*3/uL (ref 0.0–0.5)
Eosinophils Relative: 3 %
HCT: 39.3 % (ref 36.0–46.0)
Hemoglobin: 12.8 g/dL (ref 12.0–15.0)
Immature Granulocytes: 0 %
Lymphocytes Relative: 10 %
Lymphs Abs: 0.9 10*3/uL (ref 0.7–4.0)
MCH: 26.9 pg (ref 26.0–34.0)
MCHC: 32.6 g/dL (ref 30.0–36.0)
MCV: 82.7 fL (ref 80.0–100.0)
Monocytes Absolute: 0.6 10*3/uL (ref 0.1–1.0)
Monocytes Relative: 7 %
Neutro Abs: 7.1 10*3/uL (ref 1.7–7.7)
Neutrophils Relative %: 80 %
Platelet Count: 259 10*3/uL (ref 150–400)
RBC: 4.75 MIL/uL (ref 3.87–5.11)
RDW: 12.9 % (ref 11.5–15.5)
WBC Count: 9 10*3/uL (ref 4.0–10.5)
nRBC: 0 % (ref 0.0–0.2)

## 2022-04-03 LAB — CMP (CANCER CENTER ONLY)
ALT: 13 U/L (ref 0–44)
AST: 18 U/L (ref 15–41)
Albumin: 4.4 g/dL (ref 3.5–5.0)
Alkaline Phosphatase: 106 U/L (ref 38–126)
Anion gap: 5 (ref 5–15)
BUN: 15 mg/dL (ref 8–23)
CO2: 26 mmol/L (ref 22–32)
Calcium: 9.9 mg/dL (ref 8.9–10.3)
Chloride: 107 mmol/L (ref 98–111)
Creatinine: 0.82 mg/dL (ref 0.44–1.00)
GFR, Estimated: >60 mL/min (ref >60)
Glucose, Bld: 91 mg/dL (ref 70–99)
Potassium: 4.1 mmol/L (ref 3.5–5.1)
Sodium: 138 mmol/L (ref 135–145)
Total Bilirubin: 0.3 mg/dL (ref 0.3–1.2)
Total Protein: 7.3 g/dL (ref 6.5–8.1)

## 2022-04-03 LAB — VITAMIN B12: Vitamin B-12: 246 pg/mL (ref 180–914)

## 2022-04-03 MED ORDER — CYANOCOBALAMIN 1000 MCG/ML IJ SOLN
1000.0000 ug | Freq: Once | INTRAMUSCULAR | Status: AC
  Administered 2022-04-03: 1000 ug via INTRAMUSCULAR
  Filled 2022-04-03: qty 1

## 2022-04-08 LAB — METHYLMALONIC ACID, SERUM: Methylmalonic Acid, Quantitative: 169 nmol/L (ref 0–378)

## 2022-05-14 ENCOUNTER — Other Ambulatory Visit: Payer: Self-pay | Admitting: Hematology and Oncology

## 2022-05-14 DIAGNOSIS — D510 Vitamin B12 deficiency anemia due to intrinsic factor deficiency: Secondary | ICD-10-CM

## 2022-05-15 ENCOUNTER — Inpatient Hospital Stay: Payer: Medicare PPO | Attending: Oncology

## 2022-05-15 ENCOUNTER — Inpatient Hospital Stay: Payer: Medicare PPO

## 2022-05-15 ENCOUNTER — Other Ambulatory Visit: Payer: Self-pay

## 2022-05-15 DIAGNOSIS — E538 Deficiency of other specified B group vitamins: Secondary | ICD-10-CM | POA: Insufficient documentation

## 2022-05-15 DIAGNOSIS — D510 Vitamin B12 deficiency anemia due to intrinsic factor deficiency: Secondary | ICD-10-CM

## 2022-05-15 LAB — CBC WITH DIFFERENTIAL (CANCER CENTER ONLY)
Abs Immature Granulocytes: 0.04 10*3/uL (ref 0.00–0.07)
Basophils Absolute: 0 10*3/uL (ref 0.0–0.1)
Basophils Relative: 0 %
Eosinophils Absolute: 0.3 10*3/uL (ref 0.0–0.5)
Eosinophils Relative: 3 %
HCT: 39.7 % (ref 36.0–46.0)
Hemoglobin: 12.9 g/dL (ref 12.0–15.0)
Immature Granulocytes: 0 %
Lymphocytes Relative: 13 %
Lymphs Abs: 1.2 10*3/uL (ref 0.7–4.0)
MCH: 27.2 pg (ref 26.0–34.0)
MCHC: 32.5 g/dL (ref 30.0–36.0)
MCV: 83.6 fL (ref 80.0–100.0)
Monocytes Absolute: 0.6 10*3/uL (ref 0.1–1.0)
Monocytes Relative: 7 %
Neutro Abs: 7.1 10*3/uL (ref 1.7–7.7)
Neutrophils Relative %: 77 %
Platelet Count: 266 10*3/uL (ref 150–400)
RBC: 4.75 MIL/uL (ref 3.87–5.11)
RDW: 13.2 % (ref 11.5–15.5)
WBC Count: 9.3 10*3/uL (ref 4.0–10.5)
nRBC: 0 % (ref 0.0–0.2)

## 2022-05-15 LAB — CMP (CANCER CENTER ONLY)
ALT: 15 U/L (ref 0–44)
AST: 17 U/L (ref 15–41)
Albumin: 4.3 g/dL (ref 3.5–5.0)
Alkaline Phosphatase: 105 U/L (ref 38–126)
Anion gap: 5 (ref 5–15)
BUN: 13 mg/dL (ref 8–23)
CO2: 29 mmol/L (ref 22–32)
Calcium: 9.7 mg/dL (ref 8.9–10.3)
Chloride: 107 mmol/L (ref 98–111)
Creatinine: 0.88 mg/dL (ref 0.44–1.00)
GFR, Estimated: >60 mL/min (ref >60)
Glucose, Bld: 110 mg/dL — ABNORMAL HIGH (ref 70–99)
Potassium: 4 mmol/L (ref 3.5–5.1)
Sodium: 141 mmol/L (ref 135–145)
Total Bilirubin: 0.5 mg/dL (ref 0.3–1.2)
Total Protein: 7.2 g/dL (ref 6.5–8.1)

## 2022-05-15 LAB — VITAMIN B12: Vitamin B-12: 260 pg/mL (ref 180–914)

## 2022-05-15 MED ORDER — CYANOCOBALAMIN 1000 MCG/ML IJ SOLN
1000.0000 ug | Freq: Once | INTRAMUSCULAR | Status: AC
  Administered 2022-05-15: 1000 ug via INTRAMUSCULAR
  Filled 2022-05-15: qty 1

## 2022-05-15 NOTE — Patient Instructions (Signed)
Vitamin B12 Deficiency Vitamin B12 deficiency occurs when the body does not have enough of this important vitamin. The body needs this vitamin: To make red blood cells. To make DNA. This is the genetic material inside cells. To help the nerves work properly so they can carry messages from the brain to the body. Vitamin B12 deficiency can cause health problems, such as not having enough red blood cells in the blood (anemia). This can lead to nerve damage if untreated. What are the causes? This condition may be caused by: Not eating enough foods that contain vitamin B12. Not having enough stomach acid and digestive fluids to properly absorb vitamin B12 from the food that you eat. Having certain diseases that make it hard to absorb vitamin B12. These diseases include Crohn's disease, chronic pancreatitis, and cystic fibrosis. An autoimmune disorder in which the body does not make enough of a protein (intrinsic factor) within the stomach, resulting in not enough absorption of vitamin B12. Having a surgery in which part of the stomach or small intestine is removed. Taking certain medicines that make it hard for the body to absorb vitamin B12. These include: Heartburn medicines, such as antacids and proton pump inhibitors. Some medicines that are used to treat diabetes. What increases the risk? The following factors may make you more likely to develop a vitamin B12 deficiency: Being an older adult. Eating a vegetarian or vegan diet that does not include any foods that come from animals. Eating a poor diet while you are pregnant. Taking certain medicines. Having alcoholism. What are the signs or symptoms? In some cases, there are no symptoms of this condition. If the condition leads to anemia or nerve damage, various symptoms may occur, such as: Weakness. Tiredness (fatigue). Loss of appetite. Numbness or tingling in your hands and feet. Redness and burning of the tongue. Depression,  confusion, or memory problems. Trouble walking. If anemia is severe, symptoms can include: Shortness of breath. Dizziness. Rapid heart rate. How is this diagnosed? This condition may be diagnosed with a blood test to measure the level of vitamin B12 in your blood. You may also have other tests, including: A group of tests that measure certain characteristics of blood cells (complete blood count, CBC). A blood test to measure intrinsic factor. A procedure where a thin tube with a camera on the end is used to look into your stomach or intestines (endoscopy). Other tests may be needed to discover the cause of the deficiency. How is this treated? Treatment for this condition depends on the cause. This condition may be treated by: Changing your eating and drinking habits, such as: Eating more foods that contain vitamin B12. Drinking less alcohol or no alcohol. Getting vitamin B12 injections. Taking vitamin B12 supplements by mouth (orally). Your health care provider will tell you which dose is best for you. Follow these instructions at home: Eating and drinking  Include foods in your diet that come from animals and contain a lot of vitamin B12. These include: Meats and poultry. This includes beef, pork, chicken, turkey, and organ meats, such as liver. Seafood. This includes clams, rainbow trout, salmon, tuna, and haddock. Eggs. Dairy foods such as milk, yogurt, and cheese. Eat foods that have vitamin B12 added to them (are fortified), such as ready-to-eat breakfast cereals. Check the label on the package to see if a food is fortified. The items listed above may not be a complete list of foods and beverages you can eat and drink. Contact a dietitian for   more information. Alcohol use Do not drink alcohol if: Your health care provider tells you not to drink. You are pregnant, may be pregnant, or are planning to become pregnant. If you drink alcohol: Limit how much you have to: 0-1 drink a  day for women. 0-2 drinks a day for men. Know how much alcohol is in your drink. In the [place] one drink equals one 12 oz bottle of beer (355 mL), one 5 oz glass of wine (148 mL), or one 1 oz glass of hard liquor (44 mL). General instructions Get vitamin B12 injections if told to by your health care provider. Take supplements only as told by your health care provider. Follow the directions carefully. Keep all follow-up visits. This is important. Contact a health care provider if: Your symptoms come back. Your symptoms get worse or do not improve with treatment. Get help right away: You develop shortness of breath. You have a rapid heart rate. You have chest pain. You become dizzy or you faint. These symptoms may be an emergency. Get help right away. Call 911. Do not wait to see if the symptoms will go away. Do not drive yourself to the hospital. Summary Vitamin B12 deficiency occurs when the body does not have enough of this important vitamin. Common causes include not eating enough foods that contain vitamin B12, not being able to absorb vitamin B12 from the food that you eat, having a surgery in which part of the stomach or small intestine is removed, or taking certain medicines. Eat foods that have vitamin B12 in them. Treatment may include making a change in the way you eat and drink, getting vitamin B12 injections, or taking vitamin B12 supplements. This information is not intended to replace advice given to you by your health care provider. Make sure you discuss any questions you have with your health care provider. Document Revised: 11/03/2020 Document Reviewed: 11/03/2020 Elsevier Patient Education  2023 Elsevier Inc.  

## 2022-06-26 ENCOUNTER — Inpatient Hospital Stay: Payer: Medicare PPO

## 2022-06-26 ENCOUNTER — Telehealth: Payer: Self-pay | Admitting: Hematology and Oncology

## 2022-06-26 NOTE — Telephone Encounter (Signed)
Patient called to reschedule todays missed appointment.

## 2022-06-27 ENCOUNTER — Inpatient Hospital Stay: Payer: Medicare PPO | Attending: Oncology

## 2022-06-27 ENCOUNTER — Inpatient Hospital Stay: Payer: Medicare PPO

## 2022-06-27 ENCOUNTER — Other Ambulatory Visit: Payer: Self-pay

## 2022-06-27 DIAGNOSIS — D510 Vitamin B12 deficiency anemia due to intrinsic factor deficiency: Secondary | ICD-10-CM

## 2022-06-27 DIAGNOSIS — E538 Deficiency of other specified B group vitamins: Secondary | ICD-10-CM | POA: Diagnosis not present

## 2022-06-27 LAB — CMP (CANCER CENTER ONLY)
ALT: 15 U/L (ref 0–44)
AST: 17 U/L (ref 15–41)
Albumin: 4.2 g/dL (ref 3.5–5.0)
Alkaline Phosphatase: 100 U/L (ref 38–126)
Anion gap: 7 (ref 5–15)
BUN: 15 mg/dL (ref 8–23)
CO2: 24 mmol/L (ref 22–32)
Calcium: 10 mg/dL (ref 8.9–10.3)
Chloride: 108 mmol/L (ref 98–111)
Creatinine: 0.84 mg/dL (ref 0.44–1.00)
GFR, Estimated: >60 mL/min (ref >60)
Glucose, Bld: 109 mg/dL — ABNORMAL HIGH (ref 70–99)
Potassium: 4.1 mmol/L (ref 3.5–5.1)
Sodium: 139 mmol/L (ref 135–145)
Total Bilirubin: 0.4 mg/dL (ref 0.3–1.2)
Total Protein: 7.2 g/dL (ref 6.5–8.1)

## 2022-06-27 LAB — CBC WITH DIFFERENTIAL (CANCER CENTER ONLY)
Abs Immature Granulocytes: 0.03 10*3/uL (ref 0.00–0.07)
Basophils Absolute: 0 10*3/uL (ref 0.0–0.1)
Basophils Relative: 0 %
Eosinophils Absolute: 0.3 10*3/uL (ref 0.0–0.5)
Eosinophils Relative: 4 %
HCT: 40.6 % (ref 36.0–46.0)
Hemoglobin: 13 g/dL (ref 12.0–15.0)
Immature Granulocytes: 0 %
Lymphocytes Relative: 14 %
Lymphs Abs: 1 10*3/uL (ref 0.7–4.0)
MCH: 26.9 pg (ref 26.0–34.0)
MCHC: 32 g/dL (ref 30.0–36.0)
MCV: 84.1 fL (ref 80.0–100.0)
Monocytes Absolute: 0.7 10*3/uL (ref 0.1–1.0)
Monocytes Relative: 9 %
Neutro Abs: 5.6 10*3/uL (ref 1.7–7.7)
Neutrophils Relative %: 73 %
Platelet Count: 274 10*3/uL (ref 150–400)
RBC: 4.83 MIL/uL (ref 3.87–5.11)
RDW: 13.2 % (ref 11.5–15.5)
WBC Count: 7.7 10*3/uL (ref 4.0–10.5)
nRBC: 0 % (ref 0.0–0.2)

## 2022-06-27 LAB — VITAMIN B12: Vitamin B-12: 267 pg/mL (ref 180–914)

## 2022-06-27 MED ORDER — CYANOCOBALAMIN 1000 MCG/ML IJ SOLN
1000.0000 ug | Freq: Once | INTRAMUSCULAR | Status: AC
  Administered 2022-06-27: 1000 ug via INTRAMUSCULAR
  Filled 2022-06-27: qty 1

## 2022-08-07 ENCOUNTER — Inpatient Hospital Stay: Payer: Medicare PPO | Attending: Oncology

## 2022-08-07 ENCOUNTER — Other Ambulatory Visit: Payer: Self-pay

## 2022-08-07 ENCOUNTER — Inpatient Hospital Stay: Payer: Medicare PPO

## 2022-08-07 DIAGNOSIS — D510 Vitamin B12 deficiency anemia due to intrinsic factor deficiency: Secondary | ICD-10-CM

## 2022-08-07 DIAGNOSIS — E538 Deficiency of other specified B group vitamins: Secondary | ICD-10-CM | POA: Insufficient documentation

## 2022-08-07 LAB — CMP (CANCER CENTER ONLY)
ALT: 19 U/L (ref 0–44)
AST: 21 U/L (ref 15–41)
Albumin: 4.3 g/dL (ref 3.5–5.0)
Alkaline Phosphatase: 103 U/L (ref 38–126)
Anion gap: 7 (ref 5–15)
BUN: 15 mg/dL (ref 8–23)
CO2: 24 mmol/L (ref 22–32)
Calcium: 9.8 mg/dL (ref 8.9–10.3)
Chloride: 108 mmol/L (ref 98–111)
Creatinine: 0.82 mg/dL (ref 0.44–1.00)
GFR, Estimated: >60 mL/min (ref >60)
Glucose, Bld: 88 mg/dL (ref 70–99)
Potassium: 4.2 mmol/L (ref 3.5–5.1)
Sodium: 139 mmol/L (ref 135–145)
Total Bilirubin: 0.4 mg/dL (ref 0.3–1.2)
Total Protein: 7.3 g/dL (ref 6.5–8.1)

## 2022-08-07 LAB — CBC WITH DIFFERENTIAL (CANCER CENTER ONLY)
Abs Immature Granulocytes: 0.02 10*3/uL (ref 0.00–0.07)
Basophils Absolute: 0 10*3/uL (ref 0.0–0.1)
Basophils Relative: 1 %
Eosinophils Absolute: 0.3 10*3/uL (ref 0.0–0.5)
Eosinophils Relative: 4 %
HCT: 39.4 % (ref 36.0–46.0)
Hemoglobin: 13 g/dL (ref 12.0–15.0)
Immature Granulocytes: 0 %
Lymphocytes Relative: 15 %
Lymphs Abs: 1 10*3/uL (ref 0.7–4.0)
MCH: 27.6 pg (ref 26.0–34.0)
MCHC: 33 g/dL (ref 30.0–36.0)
MCV: 83.7 fL (ref 80.0–100.0)
Monocytes Absolute: 0.5 10*3/uL (ref 0.1–1.0)
Monocytes Relative: 8 %
Neutro Abs: 4.9 10*3/uL (ref 1.7–7.7)
Neutrophils Relative %: 72 %
Platelet Count: 268 10*3/uL (ref 150–400)
RBC: 4.71 MIL/uL (ref 3.87–5.11)
RDW: 13.1 % (ref 11.5–15.5)
WBC Count: 6.8 10*3/uL (ref 4.0–10.5)
nRBC: 0 % (ref 0.0–0.2)

## 2022-08-07 LAB — VITAMIN B12: Vitamin B-12: 299 pg/mL (ref 180–914)

## 2022-08-07 MED ORDER — CYANOCOBALAMIN 1000 MCG/ML IJ SOLN
1000.0000 ug | Freq: Once | INTRAMUSCULAR | Status: AC
  Administered 2022-08-07: 1000 ug via INTRAMUSCULAR
  Filled 2022-08-07: qty 1

## 2022-09-18 ENCOUNTER — Other Ambulatory Visit: Payer: Self-pay

## 2022-09-18 ENCOUNTER — Inpatient Hospital Stay: Payer: Medicare PPO | Attending: Oncology

## 2022-09-18 ENCOUNTER — Inpatient Hospital Stay: Payer: Medicare PPO

## 2022-09-18 VITALS — BP 131/57 | HR 61 | Temp 98.6°F | Resp 18

## 2022-09-18 DIAGNOSIS — E538 Deficiency of other specified B group vitamins: Secondary | ICD-10-CM | POA: Insufficient documentation

## 2022-09-18 DIAGNOSIS — D510 Vitamin B12 deficiency anemia due to intrinsic factor deficiency: Secondary | ICD-10-CM

## 2022-09-18 LAB — CMP (CANCER CENTER ONLY)
ALT: 18 U/L (ref 0–44)
AST: 19 U/L (ref 15–41)
Albumin: 4 g/dL (ref 3.5–5.0)
Alkaline Phosphatase: 122 U/L (ref 38–126)
Anion gap: 6 (ref 5–15)
BUN: 16 mg/dL (ref 8–23)
CO2: 27 mmol/L (ref 22–32)
Calcium: 9.8 mg/dL (ref 8.9–10.3)
Chloride: 106 mmol/L (ref 98–111)
Creatinine: 0.82 mg/dL (ref 0.44–1.00)
GFR, Estimated: >60 mL/min (ref >60)
Glucose, Bld: 132 mg/dL — ABNORMAL HIGH (ref 70–99)
Potassium: 4.3 mmol/L (ref 3.5–5.1)
Sodium: 139 mmol/L (ref 135–145)
Total Bilirubin: 0.4 mg/dL (ref 0.3–1.2)
Total Protein: 7.4 g/dL (ref 6.5–8.1)

## 2022-09-18 LAB — CBC WITH DIFFERENTIAL (CANCER CENTER ONLY)
Abs Immature Granulocytes: 0.02 10*3/uL (ref 0.00–0.07)
Basophils Absolute: 0 10*3/uL (ref 0.0–0.1)
Basophils Relative: 0 %
Eosinophils Absolute: 0.3 10*3/uL (ref 0.0–0.5)
Eosinophils Relative: 3 %
HCT: 40.3 % (ref 36.0–46.0)
Hemoglobin: 12.7 g/dL (ref 12.0–15.0)
Immature Granulocytes: 0 %
Lymphocytes Relative: 13 %
Lymphs Abs: 1.2 10*3/uL (ref 0.7–4.0)
MCH: 26.8 pg (ref 26.0–34.0)
MCHC: 31.5 g/dL (ref 30.0–36.0)
MCV: 85.2 fL (ref 80.0–100.0)
Monocytes Absolute: 0.6 10*3/uL (ref 0.1–1.0)
Monocytes Relative: 6 %
Neutro Abs: 7.1 10*3/uL (ref 1.7–7.7)
Neutrophils Relative %: 78 %
Platelet Count: 263 10*3/uL (ref 150–400)
RBC: 4.73 MIL/uL (ref 3.87–5.11)
RDW: 13.1 % (ref 11.5–15.5)
WBC Count: 9.3 10*3/uL (ref 4.0–10.5)
nRBC: 0 % (ref 0.0–0.2)

## 2022-09-18 LAB — VITAMIN B12: Vitamin B-12: 276 pg/mL (ref 180–914)

## 2022-09-18 MED ORDER — CYANOCOBALAMIN 1000 MCG/ML IJ SOLN
1000.0000 ug | Freq: Once | INTRAMUSCULAR | Status: AC
  Administered 2022-09-18: 1000 ug via INTRAMUSCULAR

## 2022-09-25 DIAGNOSIS — H04123 Dry eye syndrome of bilateral lacrimal glands: Secondary | ICD-10-CM | POA: Diagnosis not present

## 2022-09-25 DIAGNOSIS — Z961 Presence of intraocular lens: Secondary | ICD-10-CM | POA: Diagnosis not present

## 2022-09-25 DIAGNOSIS — H40013 Open angle with borderline findings, low risk, bilateral: Secondary | ICD-10-CM | POA: Diagnosis not present

## 2022-10-28 ENCOUNTER — Telehealth: Payer: Self-pay

## 2022-10-28 ENCOUNTER — Ambulatory Visit (INDEPENDENT_AMBULATORY_CARE_PROVIDER_SITE_OTHER): Payer: Medicare PPO

## 2022-10-28 VITALS — Ht 65.0 in | Wt 182.0 lb

## 2022-10-28 DIAGNOSIS — Z13820 Encounter for screening for osteoporosis: Secondary | ICD-10-CM

## 2022-10-28 DIAGNOSIS — Z0000 Encounter for general adult medical examination without abnormal findings: Secondary | ICD-10-CM | POA: Diagnosis not present

## 2022-10-28 NOTE — Patient Instructions (Addendum)
Kristen Nielsen , Thank you for taking time to come for your Medicare Wellness Visit. I appreciate your ongoing commitment to your health goals. Please review the following plan we discussed and let me know if I can assist you in the future.   Referrals/Orders/Follow-Ups/Clinician Recommendations: Yes; Referral to OB/GYN and order sent to [facility] for Bone Density Scan.  This is a list of the screening recommended for you and due dates:  Health Maintenance  Topic Date Due   Hepatitis C Screening  Never done   Pneumonia Vaccine (2 of 2 - PPSV23 or PCV20) 08/24/2018   COVID-19 Vaccine (6 - 2023-24 season) 06/30/2022   Flu Shot  10/24/2022   Mammogram  11/23/2022   DTaP/Tdap/Td vaccine (2 - Td or Tdap) 04/20/2023   Medicare Annual Wellness Visit  10/28/2023   Colon Cancer Screening  12/05/2030   DEXA scan (bone density measurement)  Completed   Zoster (Shingles) Vaccine  Completed   HPV Vaccine  Aged Out    Advanced directives: (Declined) Advance directive discussed with you today. Even though you declined this today, please call our office should you change your mind, and we can give you the proper paperwork for you to fill out.  Next Medicare Annual Wellness Visit scheduled for next year: Yes  Preventive Care 47 Years and Older, Female Preventive care refers to lifestyle choices and visits with your health care provider that can promote health and wellness. What does preventive care include? A yearly physical exam. This is also called an annual well check. Dental exams once or twice a year. Routine eye exams. Ask your health care provider how often you should have your eyes checked. Personal lifestyle choices, including: Daily care of your teeth and gums. Regular physical activity. Eating a healthy diet. Avoiding tobacco and drug use. Limiting alcohol use. Practicing safe sex. Taking low-dose aspirin every day. Taking vitamin and mineral supplements as recommended by your  health care provider. What happens during an annual well check? The services and screenings done by your health care provider during your annual well check will depend on your age, overall health, lifestyle risk factors, and family history of disease. Counseling  Your health care provider may ask you questions about your: Alcohol use. Tobacco use. Drug use. Emotional well-being. Home and relationship well-being. Sexual activity. Eating habits. History of falls. Memory and ability to understand (cognition). Work and work Astronomer. Reproductive health. Screening  You may have the following tests or measurements: Height, weight, and BMI. Blood pressure. Lipid and cholesterol levels. These may be checked every 5 years, or more frequently if you are over 66 years old. Skin check. Lung cancer screening. You may have this screening every year starting at age 25 if you have a 30-pack-year history of smoking and currently smoke or have quit within the past 15 years. Fecal occult blood test (FOBT) of the stool. You may have this test every year starting at age 31. Flexible sigmoidoscopy or colonoscopy. You may have a sigmoidoscopy every 5 years or a colonoscopy every 10 years starting at age 46. Hepatitis C blood test. Hepatitis B blood test. Sexually transmitted disease (STD) testing. Diabetes screening. This is done by checking your blood sugar (glucose) after you have not eaten for a while (fasting). You may have this done every 1-3 years. Bone density scan. This is done to screen for osteoporosis. You may have this done starting at age 71. Mammogram. This may be done every 1-2 years. Talk to your health  care provider about how often you should have regular mammograms. Talk with your health care provider about your test results, treatment options, and if necessary, the need for more tests. Vaccines  Your health care provider may recommend certain vaccines, such as: Influenza vaccine.  This is recommended every year. Tetanus, diphtheria, and acellular pertussis (Tdap, Td) vaccine. You may need a Td booster every 10 years. Zoster vaccine. You may need this after age 57. Pneumococcal 13-valent conjugate (PCV13) vaccine. One dose is recommended after age 62. Pneumococcal polysaccharide (PPSV23) vaccine. One dose is recommended after age 31. Talk to your health care provider about which screenings and vaccines you need and how often you need them. This information is not intended to replace advice given to you by your health care provider. Make sure you discuss any questions you have with your health care provider. Document Released: 04/07/2015 Document Revised: 11/29/2015 Document Reviewed: 01/10/2015 Elsevier Interactive Patient Education  2017 [employer].  Fall Prevention in the Home Falls can cause injuries. They can happen to people of all ages. There are many things you can do to make your home safe and to help prevent falls. What can I do on the outside of my home? Regularly fix the edges of walkways and driveways and fix any cracks. Remove anything that might make you trip as you walk through a door, such as a raised step or threshold. Trim any bushes or trees on the path to your home. Use bright outdoor lighting. Clear any walking paths of anything that might make someone trip, such as rocks or tools. Regularly check to see if handrails are loose or broken. Make sure that both sides of any steps have handrails. Any raised decks and porches should have guardrails on the edges. Have any leaves, snow, or ice cleared regularly. Use sand or salt on walking paths during winter. Clean up any spills in your garage right away. This includes oil or grease spills. What can I do in the bathroom? Use night lights. Install grab bars by the toilet and in the tub and shower. Do not use towel bars as grab bars. Use non-skid mats or decals in the tub or shower. If you need to sit  down in the shower, use a plastic, non-slip stool. Keep the floor dry. Clean up any water that spills on the floor as soon as it happens. Remove soap buildup in the tub or shower regularly. Attach bath mats securely with double-sided non-slip rug tape. Do not have throw rugs and other things on the floor that can make you trip. What can I do in the bedroom? Use night lights. Make sure that you have a light by your bed that is easy to reach. Do not use any sheets or blankets that are too big for your bed. They should not hang down onto the floor. Have a firm chair that has side arms. You can use this for support while you get dressed. Do not have throw rugs and other things on the floor that can make you trip. What can I do in the kitchen? Clean up any spills right away. Avoid walking on wet floors. Keep items that you use a lot in easy-to-reach places. If you need to reach something above you, use a strong step stool that has a grab bar. Keep electrical cords out of the way. Do not use floor polish or wax that makes floors slippery. If you must use wax, use non-skid floor wax. Do not have throw  rugs and other things on the floor that can make you trip. What can I do with my stairs? Do not leave any items on the stairs. Make sure that there are handrails on both sides of the stairs and use them. Fix handrails that are broken or loose. Make sure that handrails are as long as the stairways. Check any carpeting to make sure that it is firmly attached to the stairs. Fix any carpet that is loose or worn. Avoid having throw rugs at the top or bottom of the stairs. If you do have throw rugs, attach them to the floor with carpet tape. Make sure that you have a light switch at the top of the stairs and the bottom of the stairs. If you do not have them, ask someone to add them for you. What else can I do to help prevent falls? Wear shoes that: Do not have high heels. Have rubber bottoms. Are  comfortable and fit you well. Are closed at the toe. Do not wear sandals. If you use a stepladder: Make sure that it is fully opened. Do not climb a closed stepladder. Make sure that both sides of the stepladder are locked into place. Ask someone to hold it for you, if possible. Clearly mark and make sure that you can see: Any grab bars or handrails. First and last steps. Where the edge of each step is. Use tools that help you move around (mobility aids) if they are needed. These include: Canes. Walkers. Scooters. Crutches. Turn on the lights when you go into a dark area. Replace any light bulbs as soon as they burn out. Set up your furniture so you have a clear path. Avoid moving your furniture around. If any of your floors are uneven, fix them. If there are any pets around you, be aware of where they are. Review your medicines with your doctor. Some medicines can make you feel dizzy. This can increase your chance of falling. Ask your doctor what other things that you can do to help prevent falls. This information is not intended to replace advice given to you by your health care provider. Make sure you discuss any questions you have with your health care provider. Document Released: 01/05/2009 Document Revised: 08/17/2015 Document Reviewed: 04/15/2014 Elsevier Interactive Patient Education  2017 [employer].

## 2022-10-28 NOTE — Progress Notes (Addendum)
Subjective:   Kristen Nielsen is a 72 y.o. female who presents for Medicare Annual (Subsequent) preventive examination.  Visit Complete: Virtual  I connected with  Kristen Nielsen on 10/30/22 by a audio enabled telemedicine application and verified that I am speaking with the correct person using two identifiers.  Patient Location: Home  Provider Location: Office/Clinic  I discussed the limitations of evaluation and management by telemedicine. The patient expressed understanding and agreed to proceed.  Patient Medicare AWV questionnaire was completed by the patient on 10/27/2022; I have confirmed that all information answered by patient is correct and no changes since this date.  Vital Signs: Patient was unable to self-report vital signs via telehealth due to a lack of equipment at home.   Review of Systems     Cardiac Risk Factors include: advanced age (>63men, >72 women);family history of premature cardiovascular disease;obesity (BMI >30kg/m2);sedentary lifestyle     Objective:    Today's Vitals   10/28/22 0955 10/28/22 0956  Weight: 182 lb (82.6 kg)   Height: 5\' 5"  (1.651 m)   PainSc: 5  5   PainLoc: Knee    Body mass index is 30.29 kg/m.     10/28/2022    9:50 AM 10/26/2021    9:27 AM 06/20/2020    2:47 PM 09/18/2018    4:00 PM 09/16/2018    1:36 PM 10/02/2015    1:24 PM  Advanced Directives  Does Patient Have a Medical Advance Directive? No No No No No No  Would patient like information on creating a medical advance directive? No - Patient declined No - Patient declined No - Patient declined No - Patient declined No - Guardian declined     Current Medications (verified) Outpatient Encounter Medications as of 10/28/2022  Medication Sig   acetaminophen (TYLENOL) 500 MG tablet Take 500 mg by mouth daily as needed for moderate pain.   Cholecalciferol (VITAMIN D) 50 MCG (2000 UT) tablet Take 2 tablets (4,000 Units total) by mouth daily.   cyanocobalamin (COBAL-1000) 1000  MCG/ML injection Inject 1 mL (1,000 mcg total) into the muscle every 30 (thirty) days. (Patient taking differently: Inject 1,000 mcg into the muscle See admin instructions. Every other month)   meloxicam (MOBIC) 15 MG tablet Take 1 tablet (15 mg total) by mouth daily.   [DISCONTINUED] conjugated estrogens (PREMARIN) vaginal cream 1/2 GM VAGINALLY TWICE WEEKLY   No facility-administered encounter medications on file as of 10/28/2022.    Allergies (verified) Patient has no known allergies.   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   Procedure: TOTAL KNEE ARTHROPLASTY;  Surgeon: Jodi Geralds, MD;  Location: [facility];  Service: Orthopedics;  Laterality: Left;   Family History  Problem Relation Age of Onset   Hypertension Mother    Cancer Father        lung ca   Hypertension Father    Diabetes Father    Thyroid disease Father    Breast cancer Sister 62   Cancer Brother 72       col ca   Social History   Socioeconomic History   Marital status: Divorced    Spouse name: Not on file   Number of children: Not on file   Years of education:  Not on file   Highest education level: Not on file  Occupational History   Not on file  Tobacco Use   Smoking status: Never   Smokeless tobacco: Never  Substance and Sexual Activity   Alcohol use: Yes    Alcohol/week: 0.0 - 2.0 standard drinks of alcohol    Comment: occ   Drug use: No   Sexual activity: Not Currently    Partners: Male    Birth control/protection: Post-menopausal  Other Topics Concern   Not on file  Social History Narrative   Not on file   Social Determinants of Health   Financial Resource Strain: Low Risk  (10/28/2022)   Overall Financial Resource Strain (CARDIA)    Difficulty of Paying Living Expenses: Not  hard at all  Food Insecurity: No Food Insecurity (10/28/2022)   Hunger Vital Sign    Worried About Running Out of Food in the Last Year: Never true    Ran Out of Food in the Last Year: Never true  Transportation Needs: No Transportation Needs (10/28/2022)   PRAPARE - Administrator, Civil Service (Medical): No    Lack of Transportation (Non-Medical): No  Physical Activity: Insufficiently Active (10/28/2022)   Exercise Vital Sign    Days of Exercise per Week: 1 day    Minutes of Exercise per Session: 60 min  Stress: Stress Concern Present (10/28/2022)   [employer] of Occupational Health - Occupational Stress Questionnaire    Feeling of Stress : To some extent  Social Connections: Moderately Integrated (10/28/2022)   Social Connection and Isolation Panel [NHANES]    Frequency of Communication with Friends and Family: More than three times a week    Frequency of Social Gatherings with Friends and Family: Three times a week    Attends Religious Services: More than 4 times per year    Active Member of Clubs or Organizations: Yes    Attends Engineer, structural: More than 4 times per year    Marital Status: Divorced    Tobacco Counseling Counseling given: Not Answered   Clinical Intake:  Pre-visit preparation completed: Yes  Pain : 0-10 Pain Score: 5  Pain Type: Chronic pain Pain Location: Knee Pain Orientation: Left Pain Descriptors / Indicators: Other (Comment) (Stiffness) Pain Onset: More than a month ago Pain Frequency: Intermittent     BMI - recorded: 30.29 Nutritional Status: BMI > 30  Obese Nutritional Risks: None Diabetes: No  How often do you need to have someone help you when you read instructions, pamphlets, or other written materials from your doctor or pharmacy?: 1 - Never What is the last grade level you completed in school?: Master's Degree  Interpreter Needed?: No  Information entered by :: Susie Cassette, LPN.   Activities of  Daily Living    10/28/2022   10:01 AM 10/27/2022   10:03 PM  In your present state of health, do you have any difficulty performing the following activities:  Hearing? 0 0  Vision? 0 0  Difficulty concentrating or making decisions? 0 0  Walking or climbing stairs? 0 0  Dressing or bathing? 0 0  Doing errands, shopping? 0 0  Preparing Food and eating ? N N  Using the Toilet? N N  In the past six months, have you accidently leaked urine? N N  Do you have problems with loss of bowel control? N N  Managing your Medications? N N  Managing your Finances? N N  Housekeeping or managing your Housekeeping? N  N    Patient Care Team: Plotnikov, Georgina Quint, MD as PCP - General (Internal Medicine) Verner Chol, CNM as Referring Physician (Certified Nurse Midwife) Bernette Redbird, MD as Consulting Physician (Gastroenterology) Jodi Geralds, MD as Consulting Physician (Orthopedic Surgery) Magrinat, Valentino Hue, MD (Inactive) as Consulting Physician (Oncology) [facility] as Consulting Physician (Ophthalmology)  Indicate any recent Medical Services you may have received from other than [facility] providers in the past year (date may be approximate).     Assessment:   This is a routine wellness examination for [place].  Hearing/Vision screen Hearing Screening - Comments:: Patient denied any hearing difficulty.   No hearing aids.  Vision Screening - Comments:: Patient does wear otc readers.  Eye exam done by: [facility]    Dietary issues and exercise activities discussed:     Goals Addressed             This Visit's Progress    No healthcare goals at this time.        Depression Screen    10/28/2022    9:58 AM 10/26/2021    9:27 AM 10/26/2021    9:25 AM 07/17/2021    7:53 AM 07/17/2021    7:52 AM 06/20/2020    2:41 PM  PHQ 2/9 Scores  PHQ - 2 Score 0 0 0 1 1 0  PHQ- 9 Score 2   4      Fall Risk    10/28/2022   10:01 AM 10/27/2022   10:03 PM 10/26/2021    9:27 AM  07/17/2021    7:54 AM 06/20/2020    2:47 PM  Fall Risk   Falls in the past year? 0 0 0 0 0  Number falls in past yr: 0  0  0  Injury with Fall? 0  0  0  Risk for fall due to : No Fall Risks    No Fall Risks  Follow up Falls prevention discussed  Falls evaluation completed;Education provided  Falls evaluation completed    MEDICARE RISK AT HOME:    TIMED UP AND GO:  Was the test performed?  No    Cognitive Function:        10/28/2022   10:02 AM 10/26/2021    9:32 AM  6CIT Screen  What Year? 0 points 0 points  What month? 0 points 0 points  What time? 0 points 0 points  Count back from 20 0 points 0 points  Months in reverse 0 points 0 points  Repeat phrase 0 points 0 points  Total Score 0 points 0 points    Immunizations Immunization History  Administered Date(s) Administered   COVID-19, mRNA, vaccine(Comirnaty)12 years and older 02/28/2022   Fluad Quad(high Dose 65+) 03/10/2021   Influenza, High Dose Seasonal PF 02/28/2022   Influenza,inj,Quad PF,6+ Mos 04/19/2013, 11/24/2013   PFIZER(Purple Top)SARS-COV-2 Vaccination 04/29/2019, 05/20/2019, 12/30/2019   Pfizer Covid-19 Vaccine Bivalent Booster 88yrs & up 03/10/2021   Pneumococcal Conjugate-13 07/30/2016, 08/23/2017   Tdap 04/19/2013   Zoster Recombinant(Shingrix) 08/02/2016, 08/23/2017    TDAP status: Up to date  Flu Vaccine status: Up to date  Pneumococcal vaccine status: Due, Education has been provided regarding the importance of this vaccine. Advised may receive this vaccine at local pharmacy or Health Dept. Aware to provide a copy of the vaccination record if obtained from local pharmacy or Health Dept. Verbalized acceptance and understanding.  Covid-19 vaccine status: Completed vaccines  Qualifies for Shingles Vaccine? Yes   Zostavax completed  No   Shingrix Completed?: Yes  Screening Tests Health Maintenance  Topic Date Due   Hepatitis C Screening  Never done   Pneumonia Vaccine 22+ Years old (2 of 2  - PPSV23 or PCV20) 08/24/2018   COVID-19 Vaccine (6 - 2023-24 season) 06/30/2022   INFLUENZA VACCINE  10/24/2022   MAMMOGRAM  11/23/2022   DTaP/Tdap/Td (2 - Td or Tdap) 04/20/2023   Medicare Annual Wellness (AWV)  10/28/2023   Colonoscopy  12/05/2030   DEXA SCAN  Completed   Zoster Vaccines- Shingrix  Completed   HPV VACCINES  Aged Out    Health Maintenance  Health Maintenance Due  Topic Date Due   Hepatitis C Screening  Never done   Pneumonia Vaccine 25+ Years old (2 of 2 - PPSV23 or PCV20) 08/24/2018   COVID-19 Vaccine (6 - 2023-24 season) 06/30/2022   INFLUENZA VACCINE  10/24/2022    Colorectal cancer screening: Type of screening: Colonoscopy. Completed 12/04/2020. Repeat every 10 years  Mammogram status: Completed 11/22/2021. Repeat every year  Bone Density status: Completed 08/07/2016. Results reflect: Bone density results: NORMAL. Repeat every 5 years.  Lung Cancer Screening: (Low Dose CT Chest recommended if Age 43-80 years, 20 pack-year currently smoking OR have quit w/in 15years.) does not qualify.   Lung Cancer Screening Referral: no  Additional Screening:  Hepatitis C Screening: does qualify; Completed: no  Vision Screening: Recommended annual ophthalmology exams for early detection of glaucoma and other disorders of the eye. Is the patient up to date with their annual eye exam?  Yes  Who is the provider or what is the name of the office in which the patient attends annual eye exams? [facility] If pt is not established with a provider, would they like to be referred to a provider to establish care? No .   Dental Screening: Recommended annual dental exams for proper oral hygiene  Diabetic Foot Exam: N/A  Community Resource Referral / Chronic Care Management: CRR required this visit?  No   CCM required this visit?  No     Plan:     I have personally reviewed and noted the following in the patient's chart:   Medical and social history Use of alcohol,  tobacco or illicit drugs  Current medications and supplements including opioid prescriptions. Patient is not currently taking opioid prescriptions. Functional ability and status Nutritional status Physical activity Advanced directives List of other physicians Hospitalizations, surgeries, and ER visits in previous 12 months Vitals Screenings to include cognitive, depression, and falls Referrals and appointments  In addition, I have reviewed and discussed with patient certain preventive protocols, quality metrics, and best practice recommendations. A written personalized care plan for preventive services as well as general preventive health recommendations were provided to patient.     Mickeal Needy, LPN   1/0/2725   After Visit Summary: (Mail) Due to this being a telephonic visit, the after visit summary with patients personalized plan was offered to patient via mail   Nurse Notes: Normal cognitive status assessed by direct observation via telephone conversation by this Nurse Health Advisor. No abnormalities found.    Medical screening examination/treatment/procedure(s) were performed by non-physician practitioner and as supervising physician I was immediately available for consultation/collaboration.  I agree with above. Jacinta Shoe, MD

## 2022-10-28 NOTE — Telephone Encounter (Signed)
Patient is requesting a recommendation/referral to OB/GYN for pap smear since it has been over 2 years.

## 2022-10-29 NOTE — Telephone Encounter (Signed)
She can schedule with [facility] Thank you

## 2022-10-29 NOTE — Telephone Encounter (Signed)
Notified pt w/MD response.../lmb 

## 2022-10-30 ENCOUNTER — Inpatient Hospital Stay: Payer: Medicare PPO

## 2022-10-30 ENCOUNTER — Inpatient Hospital Stay: Payer: Medicare PPO | Attending: Oncology

## 2022-11-07 ENCOUNTER — Encounter (INDEPENDENT_AMBULATORY_CARE_PROVIDER_SITE_OTHER): Payer: Self-pay

## 2022-11-26 ENCOUNTER — Encounter: Payer: Medicare PPO | Admitting: Internal Medicine

## 2022-11-27 ENCOUNTER — Other Ambulatory Visit: Payer: Self-pay | Admitting: Internal Medicine

## 2022-11-27 DIAGNOSIS — Z1231 Encounter for screening mammogram for malignant neoplasm of breast: Secondary | ICD-10-CM

## 2022-12-05 ENCOUNTER — Ambulatory Visit (INDEPENDENT_AMBULATORY_CARE_PROVIDER_SITE_OTHER): Payer: Medicare PPO | Admitting: Internal Medicine

## 2022-12-05 ENCOUNTER — Encounter: Payer: Self-pay | Admitting: Internal Medicine

## 2022-12-05 VITALS — BP 140/80 | HR 57 | Temp 99.3°F | Ht 65.0 in | Wt 182.0 lb

## 2022-12-05 DIAGNOSIS — R739 Hyperglycemia, unspecified: Secondary | ICD-10-CM | POA: Insufficient documentation

## 2022-12-05 DIAGNOSIS — D510 Vitamin B12 deficiency anemia due to intrinsic factor deficiency: Secondary | ICD-10-CM | POA: Diagnosis not present

## 2022-12-05 DIAGNOSIS — Z23 Encounter for immunization: Secondary | ICD-10-CM

## 2022-12-05 DIAGNOSIS — Z0000 Encounter for general adult medical examination without abnormal findings: Secondary | ICD-10-CM | POA: Diagnosis not present

## 2022-12-05 NOTE — Progress Notes (Signed)
Subjective:  Patient ID: Kristen Nielsen, female    DOB: Jun 24, 1950  Age: 72 y.o. MRN: [id number]  CC: Annual Exam (No concerns)   HPI Kristen Nielsen presents for a well exam  Outpatient Medications Prior to Visit  Medication Sig Dispense Refill   acetaminophen (TYLENOL) 500 MG tablet Take 500 mg by mouth daily as needed for moderate pain.     Cholecalciferol (VITAMIN D) 50 MCG (2000 UT) tablet Take 2 tablets (4,000 Units total) by mouth daily. 100 tablet 11   cyanocobalamin (COBAL-1000) 1000 MCG/ML injection Inject 1 mL (1,000 mcg total) into the muscle every 30 (thirty) days. (Patient taking differently: Inject 1,000 mcg into the muscle See admin instructions. Every other month) 10 mL 6   meloxicam (MOBIC) 15 MG tablet Take 1 tablet (15 mg total) by mouth daily. 30 tablet 5   No facility-administered medications prior to visit.    ROS: Review of Systems  Constitutional:  Negative for activity change, appetite change, chills, fatigue and unexpected weight change.  HENT:  Negative for congestion, mouth sores and sinus pressure.   Eyes:  Negative for visual disturbance.  Respiratory:  Negative for cough and chest tightness.   Gastrointestinal:  Negative for abdominal pain and nausea.  Genitourinary:  Negative for difficulty urinating, frequency and vaginal pain.  Musculoskeletal:  Negative for back pain and gait problem.  Skin:  Negative for pallor and rash.  Neurological:  Negative for dizziness, tremors, weakness, numbness and headaches.  Psychiatric/Behavioral:  Negative for confusion and sleep disturbance.     Objective:  BP (!) 140/80 (BP Location: Right Arm, Patient Position: Sitting, Cuff Size: Normal)   Pulse (!) 57   Temp 99.3 F (37.4 C) (Oral)   Ht 5\' 5"  (1.651 m)   Wt 182 lb (82.6 kg)   LMP 03/25/2008   SpO2 98%   BMI 30.29 kg/m   BP Readings from Last 3 Encounters:  12/05/22 (!) 140/80  09/18/22 (!) 131/57  01/09/22 (!) 141/73    Wt Readings from Last 3  Encounters:  12/05/22 182 lb (82.6 kg)  10/28/22 182 lb (82.6 kg)  01/09/22 187 lb 4.8 oz (85 kg)    Physical Exam Constitutional:      General: She is not in acute distress.    Appearance: She is well-developed.  HENT:     Head: Normocephalic.     Right Ear: External ear normal.     Left Ear: External ear normal.     Nose: Nose normal.  Eyes:     General:        Right eye: No discharge.        Left eye: No discharge.     Conjunctiva/sclera: Conjunctivae normal.     Pupils: Pupils are equal, round, and reactive to light.  Neck:     Thyroid: No thyromegaly.     Vascular: No JVD.     Trachea: No tracheal deviation.  Cardiovascular:     Rate and Rhythm: Normal rate and regular rhythm.     Heart sounds: Normal heart sounds.  Pulmonary:     Effort: No respiratory distress.     Breath sounds: No stridor. No wheezing.  Abdominal:     General: Bowel sounds are normal. There is no distension.     Palpations: Abdomen is soft. There is no mass.     Tenderness: There is no abdominal tenderness. There is no guarding or rebound.  Musculoskeletal:        General:  No tenderness.     Cervical back: Normal range of motion and neck supple. No rigidity.  Lymphadenopathy:     Cervical: No cervical adenopathy.  Skin:    Findings: No erythema or rash.  Neurological:     Mental Status: She is oriented to person, place, and time.     Cranial Nerves: No cranial nerve deficit.     Motor: No abnormal muscle tone.     Coordination: Coordination normal.     Deep Tendon Reflexes: Reflexes normal.  Psychiatric:        Behavior: Behavior normal.        Thought Content: Thought content normal.        Judgment: Judgment normal.     Lab Results  Component Value Date   WBC 9.3 09/18/2022   HGB 12.7 09/18/2022   HCT 40.3 09/18/2022   PLT 263 09/18/2022   GLUCOSE 132 (H) 09/18/2022   CHOL 182 05/25/2020   TRIG 79.0 05/25/2020   HDL 61.00 05/25/2020   LDLCALC 105 (H) 05/25/2020   ALT 18  09/18/2022   AST 19 09/18/2022   NA 139 09/18/2022   K 4.3 09/18/2022   CL 106 09/18/2022   CREATININE 0.82 09/18/2022   BUN 16 09/18/2022   CO2 27 09/18/2022   TSH 0.774 12/27/2020   INR 1.1 09/16/2018    MM 3D SCREEN BREAST BILATERAL  Result Date: 11/23/2021 CLINICAL DATA:  Screening. EXAM: DIGITAL SCREENING BILATERAL MAMMOGRAM WITH TOMOSYNTHESIS AND CAD TECHNIQUE: Bilateral screening digital craniocaudal and mediolateral oblique mammograms were obtained. Bilateral screening digital breast tomosynthesis was performed. The images were evaluated with computer-aided detection. COMPARISON:  Previous exam(s). ACR Breast Density Category b: There are scattered areas of fibroglandular density. FINDINGS: There are no findings suspicious for malignancy. IMPRESSION: No mammographic evidence of malignancy. A result letter of this screening mammogram will be mailed directly to the patient. RECOMMENDATION: Screening mammogram in one year. (Code:SM-B-01Y) BI-RADS CATEGORY  1: Negative. Electronically Signed   By: Romona Curls M.D.   On: 11/23/2021 16:28    Assessment & Plan:   Problem List Items Addressed This Visit     Pernicious anemia    F/u w/Dr Leonides Schanz Chronic  On B12 injections      Relevant Orders   Hemoglobin A1c   Well adult exam - Primary     We discussed age appropriate health related issues, including available/recomended screening tests and vaccinations. Labs were ordered to be later reviewed . All questions were answered. We discussed one or more of the following - seat belt use, use of sunscreen/sun exposure exercise, fall risk reduction, second hand smoke exposure, firearm use and storage, seat belt use, a need for adhering to healthy diet and exercise. Labs were ordered.  All questions were answered. Coronary CT calcium score of 0 - 2022        Hyperglycemia   Relevant Orders   Comprehensive metabolic panel   Hemoglobin A1c   Other Visit Diagnoses     Need for  vaccination       Relevant Orders   Flu Vaccine Trivalent High Dose (Fluad)         No orders of the defined types were placed in this encounter.     Follow-up: Return in about 1 year (around 12/05/2023) for Wellness Exam.  Sonda Primes, MD

## 2022-12-05 NOTE — Assessment & Plan Note (Signed)
F/u w/Dr Leonides Schanz Chronic  On B12 injections

## 2022-12-06 ENCOUNTER — Ambulatory Visit
Admission: RE | Admit: 2022-12-06 | Discharge: 2022-12-06 | Disposition: A | Discharge status: Home / Self Care | Payer: Medicare PPO | Source: Ambulatory Visit | Attending: Internal Medicine | Admitting: Internal Medicine

## 2022-12-06 DIAGNOSIS — Z1231 Encounter for screening mammogram for malignant neoplasm of breast: Secondary | ICD-10-CM | POA: Diagnosis not present

## 2022-12-10 NOTE — Progress Notes (Signed)
[facility] [phone]   [phone]  PROGRESS NOTE  Patient Care Team: Plotnikov, Georgina Quint, MD as PCP - General (Internal Medicine) Verner Chol, CNM as Referring Physician (Certified Nurse Midwife) Bernette Redbird, MD as Consulting Physician (Gastroenterology) Jodi Geralds, MD as Consulting Physician (Orthopedic Surgery) [facility] as Consulting Physician (Ophthalmology) Jaci Standard, MD as Consulting Physician (Hematology and Oncology)  Hematological/Oncological History # Vitamin B12 Deficiency  12/27/2020: last visit with Dr. Darnelle Catalan  01/09/2022: Transition care to Dr. Leonides Schanz   Interval History:  Kristen Nielsen 72 y.o. female with medical history significant for vitamin b12 deficiency who presents for a follow up visit. The patient's last visit was on 01/09/2022. In the interim since the last visit she has continued on q 6 week IM B12 injections.   On exam today Kristen Nielsen notes she has been "okay" in the interim since our last visit.  She reports that she has had no major ups or downs in her health since we last talked.  She has had no hospitalizations or emergency room visits.  She reports that she continues taking her vitamin B12 shots and prefers the every 6-week schedule.  She notes that she could notice a difference if this shots are stretched out more than every 6 weeks.  She does have some occasional bouts of fatigue as she recently assisted with a move.  She has had no recent infectious symptoms with no runny nose, sore throat, or cough.  She notes that she is eating a regular diet and has a good appetite.  Her energy is about an 8 out of 10.  Overall she is willing and able to continue with vitamin B12 shots at this time.  She otherwise denies any fevers, chills, sweats, nausea, vomiting or diarrhea.  A full 10 point ROS was otherwise negative.  MEDICAL HISTORY:  Past Medical History:  Diagnosis Date   Anemia     Arthritis    Environmental allergies    Vitamin B 12 deficiency     SURGICAL HISTORY: Past Surgical History:  Procedure Laterality Date   TONSILLECTOMY AND ADENOIDECTOMY     TOTAL KNEE ARTHROPLASTY Left 09/18/2018   Procedure: TOTAL KNEE ARTHROPLASTY;  Surgeon: Jodi Geralds, MD;  Location: [facility];  Service: Orthopedics;  Laterality: Left;    SOCIAL HISTORY: Social History   Socioeconomic History   Marital status: Divorced    Spouse name: Not on file   Number of children: Not on file   Years of education: Not on file   Highest education level: Not on file  Occupational History   Not on file  Tobacco Use   Smoking status: Never   Smokeless tobacco: Never  Substance and Sexual Activity   Alcohol use: Yes    Alcohol/week: 0.0 - 2.0 standard drinks of alcohol    Comment: occ   Drug use: No   Sexual activity: Not Currently    Partners: Male    Birth control/protection: Post-menopausal  Other Topics Concern   Not on file  Social History Narrative   Not on file   Social Determinants of Health   Financial Resource Strain: Low Risk  (10/28/2022)   Overall Financial Resource Strain (CARDIA)    Difficulty of Paying Living Expenses: Not hard at all  Food Insecurity: No Food Insecurity (10/28/2022)   Hunger Vital Sign    Worried About Running Out of Food in the Last Year: Never true    Ran Out  of Food in the Last Year: Never true  Transportation Needs: No Transportation Needs (10/28/2022)   PRAPARE - Administrator, Civil Service (Medical): No    Lack of Transportation (Non-Medical): No  Physical Activity: Insufficiently Active (10/28/2022)   Exercise Vital Sign    Days of Exercise per Week: 1 day    Minutes of Exercise per Session: 60 min  Stress: Stress Concern Present (10/28/2022)   [employer] of Occupational Health - Occupational Stress Questionnaire    Feeling of Stress : To some extent  Social Connections: Moderately Integrated (10/28/2022)   Social  Connection and Isolation Panel [NHANES]    Frequency of Communication with Friends and Family: More than three times a week    Frequency of Social Gatherings with Friends and Family: Three times a week    Attends Religious Services: More than 4 times per year    Active Member of Clubs or Organizations: Yes    Attends Banker Meetings: More than 4 times per year    Marital Status: Divorced  Intimate Partner Violence: Not At Risk (10/28/2022)   Humiliation, Afraid, Rape, and Kick questionnaire    Fear of Current or Ex-Partner: No    Emotionally Abused: No    Physically Abused: No    Sexually Abused: No    FAMILY HISTORY: Family History  Problem Relation Age of Onset   Hypertension Mother    Cancer Father        lung ca   Hypertension Father    Diabetes Father    Thyroid disease Father    Breast cancer Sister 90   Cancer Brother 11       col ca    ALLERGIES:  has No Known Allergies.  MEDICATIONS:  Current Outpatient Medications  Medication Sig Dispense Refill   acetaminophen (TYLENOL) 500 MG tablet Take 500 mg by mouth daily as needed for moderate pain.     Cholecalciferol (VITAMIN D) 50 MCG (2000 UT) tablet Take 2 tablets (4,000 Units total) by mouth daily. 100 tablet 11   cyanocobalamin (COBAL-1000) 1000 MCG/ML injection Inject 1 mL (1,000 mcg total) into the muscle every 30 (thirty) days. (Patient taking differently: Inject 1,000 mcg into the muscle See admin instructions. Every other month) 10 mL 6   meloxicam (MOBIC) 15 MG tablet Take 1 tablet (15 mg total) by mouth daily. 30 tablet 5   No current facility-administered medications for this visit.    REVIEW OF SYSTEMS:   Constitutional: ( - ) fevers, ( - )  chills , ( - ) night sweats Eyes: ( - ) blurriness of vision, ( - ) double vision, ( - ) watery eyes Ears, nose, mouth, throat, and face: ( - ) mucositis, ( - ) sore throat Respiratory: ( - ) cough, ( - ) dyspnea, ( - ) wheezes Cardiovascular: ( - )  palpitation, ( - ) chest discomfort, ( - ) lower extremity swelling Gastrointestinal:  ( - ) nausea, ( - ) heartburn, ( - ) change in bowel habits Skin: ( - ) abnormal skin rashes Lymphatics: ( - ) new lymphadenopathy, ( - ) easy bruising Neurological: ( - ) numbness, ( - ) tingling, ( - ) new weaknesses Behavioral/Psych: ( - ) mood change, ( - ) new changes  All other systems were reviewed with the patient and are negative.  PHYSICAL EXAMINATION:  Vitals:   12/11/22 1158  BP: (!) 148/69  Pulse: (!) 50  Resp: 16  Temp: 98.2  F (36.8 C)  SpO2: 100%    Filed Weights   12/11/22 1158  Weight: 183 lb 3.2 oz (83.1 kg)     GENERAL: Well-appearing elderly African-American female, alert, no distress and comfortable SKIN: skin color, texture, turgor are normal, no rashes or significant lesions EYES: conjunctiva are pink and non-injected, sclera clear LUNGS: clear to auscultation and percussion with normal breathing effort HEART: regular rate & rhythm and no murmurs and no lower extremity edema Musculoskeletal: no cyanosis of digits and no clubbing  PSYCH: alert & oriented x 3, fluent speech NEURO: no focal motor/sensory deficits  LABORATORY DATA:  I have reviewed the data as listed    Latest Ref Rng & Units 12/11/2022   11:18 AM 09/18/2022   12:13 PM 08/07/2022   12:19 PM  CBC  WBC 4.0 - 10.5 K/uL 6.3  9.3  6.8   Hemoglobin 12.0 - 15.0 g/dL 08.6  57.8  46.9   Hematocrit 36.0 - 46.0 % 37.9  40.3  39.4   Platelets 150 - 400 K/uL 265  263  268        Latest Ref Rng & Units 12/11/2022   11:18 AM 09/18/2022   12:13 PM 08/07/2022   12:19 PM  CMP  Glucose 70 - 99 mg/dL 95  629  88   BUN 8 - 23 mg/dL 14  16  15    Creatinine 0.44 - 1.00 mg/dL 5.28  4.13  2.44   Sodium 135 - 145 mmol/L 139  139  139   Potassium 3.5 - 5.1 mmol/L 4.1  4.3  4.2   Chloride 98 - 111 mmol/L 108  106  108   CO2 22 - 32 mmol/L 27  27  24    Calcium 8.9 - 10.3 mg/dL 9.6  9.8  9.8   Total Protein 6.5 - 8.1  g/dL 7.0  7.4  7.3   Total Bilirubin 0.3 - 1.2 mg/dL 0.5  0.4  0.4   Alkaline Phos 38 - 126 U/L 103  122  103   AST 15 - 41 U/L 17  19  21    ALT 0 - 44 U/L 14  18  19      RADIOGRAPHIC STUDIES: MM 3D SCREENING MAMMOGRAM BILATERAL BREAST  Result Date: 12/09/2022 CLINICAL DATA:  Screening. EXAM: DIGITAL SCREENING BILATERAL MAMMOGRAM WITH TOMOSYNTHESIS AND CAD TECHNIQUE: Bilateral screening digital craniocaudal and mediolateral oblique mammograms were obtained. Bilateral screening digital breast tomosynthesis was performed. The images were evaluated with computer-aided detection. COMPARISON:  Previous exam(s). ACR Breast Density Category b: There are scattered areas of fibroglandular density. FINDINGS: There are no findings suspicious for malignancy. IMPRESSION: No mammographic evidence of malignancy. A result letter of this screening mammogram will be mailed directly to the patient. RECOMMENDATION: Screening mammogram in one year. (Code:SM-B-01Y) BI-RADS CATEGORY  1: Negative. Electronically Signed   By: Sherian Rein M.D.   On: 12/09/2022 16:10    ASSESSMENT & PLAN Kristen Nielsen 72 y.o. female with medical history significant for vitamin b12 deficiency who presents for a follow up visit.  # Vitamin B12 Deficiency  -- Continue vitamin B12 injections 1000 mcg every 6 weeks. 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 -- Patient not currently having any concerning symptoms today.  Tolerating shots well. --labs today show white blood cell 6.3, hemoglobin 12.3, MCV 82.8, and platelets of 265 --RTC in 6  months time for continued monitoring.   No orders of the defined types were placed in this encounter.   All questions were answered. The patient knows to call the clinic with any problems, questions or concerns.  A total of more than 30 minutes were spent on this  encounter with face-to-face time and non-face-to-face time, including preparing to see the patient, ordering tests and/or medications, counseling the patient and coordination of care as outlined above.   Ulysees Barns, MD Department of Hematology/Oncology [facility] Phone: [phone] Pager: [phone] Email: [email or web address]  12/14/2022 8:23 AM

## 2022-12-11 ENCOUNTER — Inpatient Hospital Stay: Payer: Medicare PPO

## 2022-12-11 ENCOUNTER — Inpatient Hospital Stay: Payer: Medicare PPO | Attending: Oncology | Admitting: Hematology and Oncology

## 2022-12-11 VITALS — BP 148/69 | HR 50 | Temp 98.2°F | Resp 16 | Wt 183.2 lb

## 2022-12-11 DIAGNOSIS — E538 Deficiency of other specified B group vitamins: Secondary | ICD-10-CM | POA: Diagnosis not present

## 2022-12-11 DIAGNOSIS — D510 Vitamin B12 deficiency anemia due to intrinsic factor deficiency: Secondary | ICD-10-CM

## 2022-12-11 LAB — CBC WITH DIFFERENTIAL (CANCER CENTER ONLY)
Abs Immature Granulocytes: 0.02 10*3/uL (ref 0.00–0.07)
Basophils Absolute: 0 10*3/uL (ref 0.0–0.1)
Basophils Relative: 1 %
Eosinophils Absolute: 0.3 10*3/uL (ref 0.0–0.5)
Eosinophils Relative: 4 %
HCT: 37.9 % (ref 36.0–46.0)
Hemoglobin: 12.3 g/dL (ref 12.0–15.0)
Immature Granulocytes: 0 %
Lymphocytes Relative: 19 %
Lymphs Abs: 1.2 10*3/uL (ref 0.7–4.0)
MCH: 26.9 pg (ref 26.0–34.0)
MCHC: 32.5 g/dL (ref 30.0–36.0)
MCV: 82.8 fL (ref 80.0–100.0)
Monocytes Absolute: 0.6 10*3/uL (ref 0.1–1.0)
Monocytes Relative: 10 %
Neutro Abs: 4.2 10*3/uL (ref 1.7–7.7)
Neutrophils Relative %: 66 %
Platelet Count: 265 10*3/uL (ref 150–400)
RBC: 4.58 MIL/uL (ref 3.87–5.11)
RDW: 12.9 % (ref 11.5–15.5)
WBC Count: 6.3 10*3/uL (ref 4.0–10.5)
nRBC: 0 % (ref 0.0–0.2)

## 2022-12-11 LAB — CMP (CANCER CENTER ONLY)
ALT: 14 U/L (ref 0–44)
AST: 17 U/L (ref 15–41)
Albumin: 4.1 g/dL (ref 3.5–5.0)
Alkaline Phosphatase: 103 U/L (ref 38–126)
Anion gap: 4 — ABNORMAL LOW (ref 5–15)
BUN: 14 mg/dL (ref 8–23)
CO2: 27 mmol/L (ref 22–32)
Calcium: 9.6 mg/dL (ref 8.9–10.3)
Chloride: 108 mmol/L (ref 98–111)
Creatinine: 0.86 mg/dL (ref 0.44–1.00)
GFR, Estimated: >60 mL/min (ref >60)
Glucose, Bld: 95 mg/dL (ref 70–99)
Potassium: 4.1 mmol/L (ref 3.5–5.1)
Sodium: 139 mmol/L (ref 135–145)
Total Bilirubin: 0.5 mg/dL (ref 0.3–1.2)
Total Protein: 7 g/dL (ref 6.5–8.1)

## 2022-12-11 LAB — VITAMIN B12: Vitamin B-12: 267 pg/mL (ref 180–914)

## 2022-12-11 MED ORDER — CYANOCOBALAMIN 1000 MCG/ML IJ SOLN
1000.0000 ug | Freq: Once | INTRAMUSCULAR | Status: AC
  Administered 2022-12-11: 1000 ug via INTRAMUSCULAR
  Filled 2022-12-11: qty 1

## 2022-12-11 NOTE — Patient Instructions (Signed)
Vitamin B12 Injection What is this medication? Vitamin B12 (VAHY tuh min B12) prevents and treats low vitamin B12 levels in your body. It is used in people who do not get enough vitamin B12 from their diet or when their digestive tract does not absorb enough. Vitamin B12 plays an important role in maintaining the health of your nervous system and red blood cells. This medicine may be used for other purposes; ask your health care provider or pharmacist if you have questions. COMMON BRAND NAME(S): B-12 Compliance Kit, B-12 Injection Kit, Cyomin, Dodex, LA-12, Nutri-Twelve, Physicians EZ Use B-12, Primabalt, Vitamin Deficiency Injectable System - B12 What should I tell my care team before I take this medication? They need to know if you have any of these conditions: Kidney disease Leber's disease Megaloblastic anemia An unusual or allergic reaction to cyanocobalamin, cobalt, other medications, foods, dyes, or preservatives Pregnant or trying to get pregnant Breast-feeding How should I use this medication? This medication is injected into a muscle or deeply under the skin. It is usually given in a clinic or care team's office. However, your care team may teach you how to inject yourself. Follow all instructions. Talk to your care team about the use of this medication in children. Special care may be needed. Overdosage: If you think you have taken too much of this medicine contact a poison control center or emergency room at once. NOTE: This medicine is only for you. Do not share this medicine with others. What if I miss a dose? If you are given your dose at a clinic or care team's office, call to reschedule your appointment. If you give your own injections, and you miss a dose, take it as soon as you can. If it is almost time for your next dose, take only that dose. Do not take double or extra doses. What may interact with this medication? Alcohol Colchicine This list may not describe all possible  interactions. Give your health care provider a list of all the medicines, herbs, non-prescription drugs, or dietary supplements you use. Also tell them if you smoke, drink alcohol, or use illegal drugs. Some items may interact with your medicine. What should I watch for while using this medication? Visit your care team regularly. You may need blood work done while you are taking this medication. You may need to follow a special diet. Talk to your care team. Limit your alcohol intake and avoid smoking to get the best benefit. What side effects may I notice from receiving this medication? Side effects that you should report to your care team as soon as possible: Allergic reactions--skin rash, itching, hives, swelling of the face, lips, tongue, or throat Swelling of the ankles, hands, or feet Trouble breathing Side effects that usually do not require medical attention (report to your care team if they continue or are bothersome): Diarrhea This list may not describe all possible side effects. Call your doctor for medical advice about side effects. You may report side effects to FDA at 1-800-FDA-1088. Where should I keep my medication? Keep out of the reach of children. Store at room temperature between 15 and 30 degrees C (59 and 85 degrees F). Protect from light. Throw away any unused medication after the expiration date. NOTE: This sheet is a summary. It may not cover all possible information. If you have questions about this medicine, talk to your doctor, pharmacist, or health care provider.  2024 Elsevier/Gold Standard (2020-11-21 00:00:00)

## 2022-12-14 ENCOUNTER — Encounter: Payer: Self-pay | Admitting: Oncology

## 2023-01-13 ENCOUNTER — Ambulatory Visit (INDEPENDENT_AMBULATORY_CARE_PROVIDER_SITE_OTHER)
Admission: RE | Admit: 2023-01-13 | Discharge: 2023-01-13 | Disposition: A | Discharge status: Home / Self Care | Payer: Medicare PPO | Source: Ambulatory Visit | Attending: Internal Medicine | Admitting: Internal Medicine

## 2023-01-13 DIAGNOSIS — Z13820 Encounter for screening for osteoporosis: Secondary | ICD-10-CM | POA: Diagnosis not present

## 2023-01-22 ENCOUNTER — Inpatient Hospital Stay: Payer: Medicare PPO

## 2023-01-22 ENCOUNTER — Inpatient Hospital Stay: Payer: Medicare PPO | Attending: Oncology

## 2023-01-22 DIAGNOSIS — D510 Vitamin B12 deficiency anemia due to intrinsic factor deficiency: Secondary | ICD-10-CM

## 2023-01-22 DIAGNOSIS — E538 Deficiency of other specified B group vitamins: Secondary | ICD-10-CM | POA: Insufficient documentation

## 2023-01-22 LAB — CBC WITH DIFFERENTIAL (CANCER CENTER ONLY)
Abs Immature Granulocytes: 0.03 10*3/uL (ref 0.00–0.07)
Basophils Absolute: 0 10*3/uL (ref 0.0–0.1)
Basophils Relative: 0 %
Eosinophils Absolute: 0.5 10*3/uL (ref 0.0–0.5)
Eosinophils Relative: 5 %
HCT: 39.5 % (ref 36.0–46.0)
Hemoglobin: 13.1 g/dL (ref 12.0–15.0)
Immature Granulocytes: 0 %
Lymphocytes Relative: 13 %
Lymphs Abs: 1.2 10*3/uL (ref 0.7–4.0)
MCH: 27.2 pg (ref 26.0–34.0)
MCHC: 33.2 g/dL (ref 30.0–36.0)
MCV: 82.1 fL (ref 80.0–100.0)
Monocytes Absolute: 0.7 10*3/uL (ref 0.1–1.0)
Monocytes Relative: 8 %
Neutro Abs: 6.8 10*3/uL (ref 1.7–7.7)
Neutrophils Relative %: 74 %
Platelet Count: 271 10*3/uL (ref 150–400)
RBC: 4.81 MIL/uL (ref 3.87–5.11)
RDW: 13 % (ref 11.5–15.5)
WBC Count: 9.2 10*3/uL (ref 4.0–10.5)
nRBC: 0 % (ref 0.0–0.2)

## 2023-01-22 LAB — CMP (CANCER CENTER ONLY)
ALT: 15 U/L (ref 0–44)
AST: 18 U/L (ref 15–41)
Albumin: 4.2 g/dL (ref 3.5–5.0)
Alkaline Phosphatase: 99 U/L (ref 38–126)
Anion gap: 6 (ref 5–15)
BUN: 14 mg/dL (ref 8–23)
CO2: 27 mmol/L (ref 22–32)
Calcium: 9.9 mg/dL (ref 8.9–10.3)
Chloride: 107 mmol/L (ref 98–111)
Creatinine: 0.85 mg/dL (ref 0.44–1.00)
GFR, Estimated: >60 mL/min (ref >60)
Glucose, Bld: 97 mg/dL (ref 70–99)
Potassium: 4.2 mmol/L (ref 3.5–5.1)
Sodium: 140 mmol/L (ref 135–145)
Total Bilirubin: 0.5 mg/dL (ref 0.3–1.2)
Total Protein: 7.3 g/dL (ref 6.5–8.1)

## 2023-01-22 LAB — VITAMIN B12: Vitamin B-12: 301 pg/mL (ref 180–914)

## 2023-01-22 MED ORDER — CYANOCOBALAMIN 1000 MCG/ML IJ SOLN
1000.0000 ug | Freq: Once | INTRAMUSCULAR | Status: AC
Start: 2023-01-22 — End: 2023-01-22
  Administered 2023-01-22: 1000 ug via INTRAMUSCULAR
  Filled 2023-01-22: qty 1

## 2023-01-22 NOTE — Patient Instructions (Signed)
 Vitamin B12 Injection What is this medication? Vitamin B12 (VAHY tuh min B12) prevents and treats low vitamin B12 levels in your body. It is used in people who do not get enough vitamin B12 from their diet or when their digestive tract does not absorb enough. Vitamin B12 plays an important role in maintaining the health of your nervous system and red blood cells. This medicine may be used for other purposes; ask your health care provider or pharmacist if you have questions. COMMON BRAND NAME(S): B-12 Compliance Kit, B-12 Injection Kit, Cyomin, Dodex, LA-12, Nutri-Twelve, Physicians EZ Use B-12, Primabalt, Vitamin Deficiency Injectable System - B12 What should I tell my care team before I take this medication? They need to know if you have any of these conditions: Kidney disease Leber's disease Megaloblastic anemia An unusual or allergic reaction to cyanocobalamin, cobalt, other medications, foods, dyes, or preservatives Pregnant or trying to get pregnant Breast-feeding How should I use this medication? This medication is injected into a muscle or deeply under the skin. It is usually given in a clinic or care team's office. However, your care team may teach you how to inject yourself. Follow all instructions. Talk to your care team about the use of this medication in children. Special care may be needed. Overdosage: If you think you have taken too much of this medicine contact a poison control center or emergency room at once. NOTE: This medicine is only for you. Do not share this medicine with others. What if I miss a dose? If you are given your dose at a clinic or care team's office, call to reschedule your appointment. If you give your own injections, and you miss a dose, take it as soon as you can. If it is almost time for your next dose, take only that dose. Do not take double or extra doses. What may interact with this medication? Alcohol Colchicine This list may not describe all possible  interactions. Give your health care provider a list of all the medicines, herbs, non-prescription drugs, or dietary supplements you use. Also tell them if you smoke, drink alcohol, or use illegal drugs. Some items may interact with your medicine. What should I watch for while using this medication? Visit your care team regularly. You may need blood work done while you are taking this medication. You may need to follow a special diet. Talk to your care team. Limit your alcohol intake and avoid smoking to get the best benefit. What side effects may I notice from receiving this medication? Side effects that you should report to your care team as soon as possible: Allergic reactions--skin rash, itching, hives, swelling of the face, lips, tongue, or throat Swelling of the ankles, hands, or feet Trouble breathing Side effects that usually do not require medical attention (report to your care team if they continue or are bothersome): Diarrhea This list may not describe all possible side effects. Call your doctor for medical advice about side effects. You may report side effects to FDA at 1-800-FDA-1088. Where should I keep my medication? Keep out of the reach of children. Store at room temperature between 15 and 30 degrees C (59 and 85 degrees F). Protect from light. Throw away any unused medication after the expiration date. NOTE: This sheet is a summary. It may not cover all possible information. If you have questions about this medicine, talk to your doctor, pharmacist, or health care provider.  2024 Elsevier/Gold Standard (2020-11-21 00:00:00)

## 2023-03-05 ENCOUNTER — Inpatient Hospital Stay: Payer: Medicare PPO | Attending: Oncology

## 2023-03-05 ENCOUNTER — Inpatient Hospital Stay: Payer: Medicare PPO

## 2023-03-05 VITALS — BP 143/78 | HR 58 | Temp 98.0°F | Resp 18

## 2023-03-05 DIAGNOSIS — D510 Vitamin B12 deficiency anemia due to intrinsic factor deficiency: Secondary | ICD-10-CM

## 2023-03-05 DIAGNOSIS — E538 Deficiency of other specified B group vitamins: Secondary | ICD-10-CM | POA: Diagnosis not present

## 2023-03-05 LAB — CBC WITH DIFFERENTIAL (CANCER CENTER ONLY)
Abs Immature Granulocytes: 0.03 10*3/uL (ref 0.00–0.07)
Basophils Absolute: 0 10*3/uL (ref 0.0–0.1)
Basophils Relative: 1 %
Eosinophils Absolute: 0.4 10*3/uL (ref 0.0–0.5)
Eosinophils Relative: 5 %
HCT: 39 % (ref 36.0–46.0)
Hemoglobin: 12.3 g/dL (ref 12.0–15.0)
Immature Granulocytes: 0 %
Lymphocytes Relative: 19 %
Lymphs Abs: 1.3 10*3/uL (ref 0.7–4.0)
MCH: 26.6 pg (ref 26.0–34.0)
MCHC: 31.5 g/dL (ref 30.0–36.0)
MCV: 84.2 fL (ref 80.0–100.0)
Monocytes Absolute: 0.7 10*3/uL (ref 0.1–1.0)
Monocytes Relative: 10 %
Neutro Abs: 4.5 10*3/uL (ref 1.7–7.7)
Neutrophils Relative %: 65 %
Platelet Count: 269 10*3/uL (ref 150–400)
RBC: 4.63 MIL/uL (ref 3.87–5.11)
RDW: 13.5 % (ref 11.5–15.5)
WBC Count: 6.9 10*3/uL (ref 4.0–10.5)
nRBC: 0 % (ref 0.0–0.2)

## 2023-03-05 LAB — CMP (CANCER CENTER ONLY)
ALT: 17 U/L (ref 0–44)
AST: 18 U/L (ref 15–41)
Albumin: 4.1 g/dL (ref 3.5–5.0)
Alkaline Phosphatase: 109 U/L (ref 38–126)
Anion gap: 4 — ABNORMAL LOW (ref 5–15)
BUN: 13 mg/dL (ref 8–23)
CO2: 30 mmol/L (ref 22–32)
Calcium: 9.8 mg/dL (ref 8.9–10.3)
Chloride: 108 mmol/L (ref 98–111)
Creatinine: 0.92 mg/dL (ref 0.44–1.00)
GFR, Estimated: >60 mL/min (ref >60)
Glucose, Bld: 90 mg/dL (ref 70–99)
Potassium: 4.3 mmol/L (ref 3.5–5.1)
Sodium: 142 mmol/L (ref 135–145)
Total Bilirubin: 0.4 mg/dL (ref <1.2)
Total Protein: 7 g/dL (ref 6.5–8.1)

## 2023-03-05 LAB — VITAMIN B12: Vitamin B-12: 333 pg/mL (ref 180–914)

## 2023-03-05 MED ORDER — CYANOCOBALAMIN 1000 MCG/ML IJ SOLN
1000.0000 ug | Freq: Once | INTRAMUSCULAR | Status: AC
  Administered 2023-03-05: 1000 ug via INTRAMUSCULAR
  Filled 2023-03-05: qty 1

## 2023-04-16 ENCOUNTER — Inpatient Hospital Stay: Payer: Medicare PPO | Attending: Oncology

## 2023-04-16 ENCOUNTER — Inpatient Hospital Stay: Payer: Medicare PPO

## 2023-04-16 DIAGNOSIS — D510 Vitamin B12 deficiency anemia due to intrinsic factor deficiency: Secondary | ICD-10-CM

## 2023-04-16 DIAGNOSIS — E538 Deficiency of other specified B group vitamins: Secondary | ICD-10-CM | POA: Diagnosis not present

## 2023-04-16 LAB — CBC WITH DIFFERENTIAL (CANCER CENTER ONLY)
Abs Immature Granulocytes: 0.02 10*3/uL (ref 0.00–0.07)
Basophils Absolute: 0 10*3/uL (ref 0.0–0.1)
Basophils Relative: 0 %
Eosinophils Absolute: 0.2 10*3/uL (ref 0.0–0.5)
Eosinophils Relative: 3 %
HCT: 40.4 % (ref 36.0–46.0)
Hemoglobin: 12.7 g/dL (ref 12.0–15.0)
Immature Granulocytes: 0 %
Lymphocytes Relative: 17 %
Lymphs Abs: 1.3 10*3/uL (ref 0.7–4.0)
MCH: 26.3 pg (ref 26.0–34.0)
MCHC: 31.4 g/dL (ref 30.0–36.0)
MCV: 83.6 fL (ref 80.0–100.0)
Monocytes Absolute: 0.6 10*3/uL (ref 0.1–1.0)
Monocytes Relative: 8 %
Neutro Abs: 5.3 10*3/uL (ref 1.7–7.7)
Neutrophils Relative %: 72 %
Platelet Count: 266 10*3/uL (ref 150–400)
RBC: 4.83 MIL/uL (ref 3.87–5.11)
RDW: 13.2 % (ref 11.5–15.5)
WBC Count: 7.6 10*3/uL (ref 4.0–10.5)
nRBC: 0 % (ref 0.0–0.2)

## 2023-04-16 LAB — CMP (CANCER CENTER ONLY)
ALT: 13 U/L (ref 0–44)
AST: 16 U/L (ref 15–41)
Albumin: 4.1 g/dL (ref 3.5–5.0)
Alkaline Phosphatase: 98 U/L (ref 38–126)
Anion gap: 4 — ABNORMAL LOW (ref 5–15)
BUN: 15 mg/dL (ref 8–23)
CO2: 26 mmol/L (ref 22–32)
Calcium: 9.8 mg/dL (ref 8.9–10.3)
Chloride: 107 mmol/L (ref 98–111)
Creatinine: 0.86 mg/dL (ref 0.44–1.00)
GFR, Estimated: >60 mL/min (ref >60)
Glucose, Bld: 100 mg/dL — ABNORMAL HIGH (ref 70–99)
Potassium: 4.1 mmol/L (ref 3.5–5.1)
Sodium: 137 mmol/L (ref 135–145)
Total Bilirubin: 0.5 mg/dL (ref 0.0–1.2)
Total Protein: 7 g/dL (ref 6.5–8.1)

## 2023-04-16 LAB — VITAMIN B12: Vitamin B-12: 345 pg/mL (ref 180–914)

## 2023-04-16 MED ORDER — CYANOCOBALAMIN 1000 MCG/ML IJ SOLN
1000.0000 ug | Freq: Once | INTRAMUSCULAR | Status: AC
  Administered 2023-04-16: 1000 ug via INTRAMUSCULAR
  Filled 2023-04-16: qty 1

## 2023-05-02 ENCOUNTER — Telehealth: Payer: Self-pay | Admitting: Physician Assistant

## 2023-05-02 NOTE — Telephone Encounter (Signed)
 Rescheduled appointments per provider on PAL. Patient is aware of the changes made and is active on MyChart.

## 2023-05-10 ENCOUNTER — Encounter: Payer: Self-pay | Admitting: Internal Medicine

## 2023-05-13 DIAGNOSIS — N952 Postmenopausal atrophic vaginitis: Secondary | ICD-10-CM | POA: Diagnosis not present

## 2023-05-13 DIAGNOSIS — Z803 Family history of malignant neoplasm of breast: Secondary | ICD-10-CM | POA: Diagnosis not present

## 2023-05-13 DIAGNOSIS — M8580 Other specified disorders of bone density and structure, unspecified site: Secondary | ICD-10-CM | POA: Diagnosis not present

## 2023-05-13 DIAGNOSIS — Z6830 Body mass index (BMI) 30.0-30.9, adult: Secondary | ICD-10-CM | POA: Diagnosis not present

## 2023-05-13 DIAGNOSIS — Z01419 Encounter for gynecological examination (general) (routine) without abnormal findings: Secondary | ICD-10-CM | POA: Diagnosis not present

## 2023-05-22 ENCOUNTER — Other Ambulatory Visit: Payer: Self-pay | Admitting: Physician Assistant

## 2023-05-22 DIAGNOSIS — D510 Vitamin B12 deficiency anemia due to intrinsic factor deficiency: Secondary | ICD-10-CM

## 2023-05-28 ENCOUNTER — Inpatient Hospital Stay: Payer: Medicare PPO

## 2023-05-28 ENCOUNTER — Inpatient Hospital Stay: Payer: Medicare PPO | Attending: Oncology

## 2023-05-28 ENCOUNTER — Inpatient Hospital Stay (HOSPITAL_BASED_OUTPATIENT_CLINIC_OR_DEPARTMENT_OTHER): Payer: Medicare PPO | Admitting: Physician Assistant

## 2023-05-28 ENCOUNTER — Inpatient Hospital Stay: Payer: Medicare PPO | Admitting: Hematology and Oncology

## 2023-05-28 VITALS — BP 161/73 | HR 50 | Temp 97.6°F | Resp 14 | Wt 188.2 lb

## 2023-05-28 DIAGNOSIS — E538 Deficiency of other specified B group vitamins: Secondary | ICD-10-CM | POA: Insufficient documentation

## 2023-05-28 DIAGNOSIS — D510 Vitamin B12 deficiency anemia due to intrinsic factor deficiency: Secondary | ICD-10-CM

## 2023-05-28 LAB — CMP (CANCER CENTER ONLY)
ALT: 16 U/L (ref 0–44)
AST: 17 U/L (ref 15–41)
Albumin: 4.2 g/dL (ref 3.5–5.0)
Alkaline Phosphatase: 105 U/L (ref 38–126)
Anion gap: 4 — ABNORMAL LOW (ref 5–15)
BUN: 18 mg/dL (ref 8–23)
CO2: 29 mmol/L (ref 22–32)
Calcium: 9.6 mg/dL (ref 8.9–10.3)
Chloride: 107 mmol/L (ref 98–111)
Creatinine: 0.84 mg/dL (ref 0.44–1.00)
GFR, Estimated: >60 mL/min (ref >60)
Glucose, Bld: 103 mg/dL — ABNORMAL HIGH (ref 70–99)
Potassium: 4.3 mmol/L (ref 3.5–5.1)
Sodium: 140 mmol/L (ref 135–145)
Total Bilirubin: 0.4 mg/dL (ref 0.0–1.2)
Total Protein: 7 g/dL (ref 6.5–8.1)

## 2023-05-28 LAB — CBC WITH DIFFERENTIAL (CANCER CENTER ONLY)
Abs Immature Granulocytes: 0.02 10*3/uL (ref 0.00–0.07)
Basophils Absolute: 0 10*3/uL (ref 0.0–0.1)
Basophils Relative: 1 %
Eosinophils Absolute: 0.4 10*3/uL (ref 0.0–0.5)
Eosinophils Relative: 5 %
HCT: 38.5 % (ref 36.0–46.0)
Hemoglobin: 12.2 g/dL (ref 12.0–15.0)
Immature Granulocytes: 0 %
Lymphocytes Relative: 17 %
Lymphs Abs: 1.3 10*3/uL (ref 0.7–4.0)
MCH: 26.5 pg (ref 26.0–34.0)
MCHC: 31.7 g/dL (ref 30.0–36.0)
MCV: 83.7 fL (ref 80.0–100.0)
Monocytes Absolute: 0.7 10*3/uL (ref 0.1–1.0)
Monocytes Relative: 9 %
Neutro Abs: 5.2 10*3/uL (ref 1.7–7.7)
Neutrophils Relative %: 68 %
Platelet Count: 263 10*3/uL (ref 150–400)
RBC: 4.6 MIL/uL (ref 3.87–5.11)
RDW: 13 % (ref 11.5–15.5)
WBC Count: 7.6 10*3/uL (ref 4.0–10.5)
nRBC: 0 % (ref 0.0–0.2)

## 2023-05-28 LAB — VITAMIN B12: Vitamin B-12: 341 pg/mL (ref 180–914)

## 2023-05-28 MED ORDER — CYANOCOBALAMIN 1000 MCG/ML IJ SOLN
1000.0000 ug | Freq: Once | INTRAMUSCULAR | Status: AC
  Administered 2023-05-28: 1000 ug via INTRAMUSCULAR

## 2023-05-28 NOTE — Progress Notes (Signed)
 [facility] [phone]   [phone]  PROGRESS NOTE  Patient Care Team: Plotnikov, Georgina Quint, MD as PCP - General (Internal Medicine) Verner Chol, CNM as Referring Physician (Certified Nurse Midwife) Bernette Redbird, MD as Consulting Physician (Gastroenterology) Jodi Geralds, MD as Consulting Physician (Orthopedic Surgery) [facility] as Consulting Physician (Ophthalmology) Jaci Standard, MD as Consulting Physician (Hematology and Oncology)  Hematological/Oncological History # Vitamin B12 Deficiency  12/27/2020: last visit with Dr. Darnelle Catalan  01/09/2022: Transition care to Dr. Leonides Schanz   Interval History:  Kristen Nielsen 73 y.o. female with medical history significant for vitamin b12 deficiency who presents for a follow up visit. The patient's last visit was on 12/11/2022. In the interim since the last visit she has continued on q 6 week IM B12 injections.   On exam today Kristen Nielsen reports she is doing well but does complain of some mild fatigue. She is able to complete her ADLs on her own. She has a good appetite without any weight changes. She denies nausea, vomiting or bowel habit changes. She denies easy bruising or signs of bleeding. She is tolerating B12 injections without any issues.  Overall she is willing and able to continue with vitamin B12 shots at this time.  She otherwise denies any fevers, chills, sweats, shortness of breath, chest pain or cough. He has no other complaints.  A full 10 point ROS was otherwise negative.  MEDICAL HISTORY:  Past Medical History:  Diagnosis Date   Anemia    Arthritis    Environmental allergies    Vitamin B 12 deficiency     SURGICAL HISTORY: Past Surgical History:  Procedure Laterality Date   TONSILLECTOMY AND ADENOIDECTOMY     TOTAL KNEE ARTHROPLASTY Left 09/18/2018   Procedure: TOTAL KNEE ARTHROPLASTY;  Surgeon: Jodi Geralds, MD;  Location: [facility];  Service: Orthopedics;   Laterality: Left;    SOCIAL HISTORY: Social History   Socioeconomic History   Marital status: Divorced    Spouse name: Not on file   Number of children: Not on file   Years of education: Not on file   Highest education level: Not on file  Occupational History   Not on file  Tobacco Use   Smoking status: Never   Smokeless tobacco: Never  Substance and Sexual Activity   Alcohol use: Yes    Alcohol/week: 0.0 - 2.0 standard drinks of alcohol    Comment: occ   Drug use: No   Sexual activity: Not Currently    Partners: Male    Birth control/protection: Post-menopausal  Other Topics Concern   Not on file  Social History Narrative   Not on file   Social Drivers of Health   Financial Resource Strain: Low Risk  (10/28/2022)   Overall Financial Resource Strain (CARDIA)    Difficulty of Paying Living Expenses: Not hard at all  Food Insecurity: No Food Insecurity (10/28/2022)   Hunger Vital Sign    Worried About Running Out of Food in the Last Year: Never true    Ran Out of Food in the Last Year: Never true  Transportation Needs: No Transportation Needs (10/28/2022)   PRAPARE - Administrator, Civil Service (Medical): No    Lack of Transportation (Non-Medical): No  Physical Activity: Insufficiently Active (10/28/2022)   Exercise Vital Sign    Days of Exercise per Week: 1 day    Minutes of Exercise per Session: 60 min  Stress: Stress Concern Present (  10/28/2022)   [employer] [employer] - Occupational Stress Questionnaire    Feeling of Stress : To some extent  Social Connections: Moderately Integrated (10/28/2022)   Social Connection and Isolation Panel [NHANES]    Frequency of Communication with Friends and Family: More than three times a week    Frequency of Social Gatherings with Friends and Family: Three times a week    Attends Religious Services: More than 4 times per year    Active Member of Clubs or Organizations: Yes    Attends Banker  Meetings: More than 4 times per year    Marital Status: Divorced  Intimate Partner Violence: Not At Risk (10/28/2022)   Humiliation, Afraid, Rape, and Kick questionnaire    Fear of Current or Ex-Partner: No    Emotionally Abused: No    Physically Abused: No    Sexually Abused: No    FAMILY HISTORY: Family History  Problem Relation Age of Onset   Hypertension Mother    Cancer Father        lung ca   Hypertension Father    Diabetes Father    Thyroid disease Father    Breast cancer Sister 3   Cancer Brother 49       col ca    ALLERGIES:  has no known allergies.  MEDICATIONS:  Current Outpatient Medications  Medication Sig Dispense Refill   acetaminophen (TYLENOL) 500 MG tablet Take 500 mg by mouth daily as needed for moderate pain.     Calcium Carbonate-Vit D-Min (CALCIUM 1200 PO) Take by mouth.     Cholecalciferol (VITAMIN D) 50 MCG (2000 UT) tablet Take 2 tablets (4,000 Units total) by mouth daily. 100 tablet 11   cyanocobalamin (COBAL-1000) 1000 MCG/ML injection Inject 1 mL (1,000 mcg total) into the muscle every 30 (thirty) days. (Patient taking differently: Inject 1,000 mcg into the muscle See admin instructions. Every other month) 10 mL 6   meloxicam (MOBIC) 15 MG tablet Take 1 tablet (15 mg total) by mouth daily. 30 tablet 5   No current facility-administered medications for this visit.    REVIEW OF SYSTEMS:   Constitutional: ( - ) fevers, ( - )  chills , ( - ) night sweats Eyes: ( - ) blurriness of vision, ( - ) double vision, ( - ) watery eyes Ears, nose, mouth, throat, and face: ( - ) mucositis, ( - ) sore throat Respiratory: ( - ) cough, ( - ) dyspnea, ( - ) wheezes Cardiovascular: ( - ) palpitation, ( - ) chest discomfort, ( - ) lower extremity swelling Gastrointestinal:  ( - ) nausea, ( - ) heartburn, ( - ) change in bowel habits Skin: ( - ) abnormal skin rashes Lymphatics: ( - ) new lymphadenopathy, ( - ) easy bruising Neurological: ( - ) numbness, ( - )  tingling, ( - ) new weaknesses Behavioral/Psych: ( - ) mood change, ( - ) new changes  All other systems were reviewed with the patient and are negative.  PHYSICAL EXAMINATION:  Vitals:   05/28/23 1122  BP: (!) 161/73  Pulse: (!) 50  Resp: 14  Temp: 97.6 F (36.4 C)  SpO2: 100%    Filed Weights   05/28/23 1122  Weight: 188 lb 3.2 oz (85.4 kg)     GENERAL: Well-appearing elderly African-American female, alert, no distress and comfortable SKIN: skin color, texture, turgor are normal, no rashes or significant lesions EYES: conjunctiva are pink and non-injected, sclera clear LUNGS: clear  to auscultation and percussion with normal breathing effort HEART: bradycardic with regular rhythm and no murmurs and no lower extremity edema Musculoskeletal: no cyanosis of digits and no clubbing  PSYCH: alert & oriented x 3, fluent speech NEURO: no focal motor/sensory deficits  LABORATORY DATA:  I have reviewed the data as listed    Latest Ref Rng & Units 05/28/2023   10:54 AM 04/16/2023   10:55 AM 03/05/2023   11:22 AM  CBC  WBC 4.0 - 10.5 K/uL 7.6  7.6  6.9   Hemoglobin 12.0 - 15.0 g/dL 29.5  62.1  30.8   Hematocrit 36.0 - 46.0 % 38.5  40.4  39.0   Platelets 150 - 400 K/uL 263  266  269        Latest Ref Rng & Units 05/28/2023   10:54 AM 04/16/2023   10:55 AM 03/05/2023   11:22 AM  CMP  Glucose 70 - 99 mg/dL 657  846  90   BUN 8 - 23 mg/dL 18  15  13    Creatinine 0.44 - 1.00 mg/dL 9.62  9.52  8.41   Sodium 135 - 145 mmol/L 140  137  142   Potassium 3.5 - 5.1 mmol/L 4.3  4.1  4.3   Chloride 98 - 111 mmol/L 107  107  108   CO2 22 - 32 mmol/L 29  26  30    Calcium 8.9 - 10.3 mg/dL 9.6  9.8  9.8   Total Protein 6.5 - 8.1 g/dL 7.0  7.0  7.0   Total Bilirubin 0.0 - 1.2 mg/dL 0.4  0.5  0.4   Alkaline Phos 38 - 126 U/L 105  98  109   AST 15 - 41 U/L 17  16  18    ALT 0 - 44 U/L 16  13  17      RADIOGRAPHIC STUDIES: No results found.  ASSESSMENT & PLAN Kristen Nielsen BEGIN 73 y.o.  female with medical history significant for vitamin b12 deficiency who presents for a follow up visit.  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 -- Labs today show WBC 7.6, Hgb 12.2, MCV 83.7, Plt 263, creatinine and LFTs normal. Vitamin B12 level 341. -- Continue vitamin B12 injections 1000 mcg but patient is requesting to increase frequency to every 4 weeks instead which is appropriate since vitamin B12 levels are low end of normal.  --RTC for monthly B12 injection and follow up visit in 6 months.   #Bradycardia: --HR is 50 bpm during visit.  --EKG showed sinus bradycardia similar to EKG from June 2020.  --Patient adds that her mother and sister both have lower HR at baseline --Advised to monitor for any chest discomfort, shortness of breath or worsening fatigue. If there is any new symptom, she will follow up with PCP to further evaluate.   Orders Placed This Encounter  Procedures   CBC with Differential (Cancer Center Only)    Standing Status:   Future    Expected Date:   08/28/2023    Expiration Date:   05/27/2024   CMP (Cancer Center only)    Standing Status:   Future    Expected Date:   08/28/2023    Expiration Date:   05/27/2024   Vitamin B12    Standing Status:   Future    Expected Date:   08/28/2023    Expiration Date:   05/27/2024   EKG 12-Lead  Ordered by an unspecified provider     All questions were answered. The patient knows to call the clinic with any problems, questions or concerns.  A total of more than 30 minutes were spent on this encounter with face-to-face time and non-face-to-face time, including preparing to see the patient, ordering tests and/or medications, counseling the patient and coordination of care as outlined above.   Georga Kaufmann PA-C Dept of Hematology and Oncology [facility] Phone: [phone]   05/28/2023 1:12 PM

## 2023-05-29 ENCOUNTER — Other Ambulatory Visit: Payer: Self-pay | Admitting: Physician Assistant

## 2023-05-29 ENCOUNTER — Telehealth: Payer: Self-pay

## 2023-05-29 NOTE — Telephone Encounter (Signed)
 Spoke with pt and she confirmed that she is only receiving her B12 through [facility] and not from her PCP

## 2023-06-25 ENCOUNTER — Inpatient Hospital Stay: Attending: Oncology

## 2023-06-25 DIAGNOSIS — D510 Vitamin B12 deficiency anemia due to intrinsic factor deficiency: Secondary | ICD-10-CM | POA: Diagnosis not present

## 2023-06-25 MED ORDER — CYANOCOBALAMIN 1000 MCG/ML IJ SOLN
1000.0000 ug | Freq: Once | INTRAMUSCULAR | Status: AC
  Administered 2023-06-25: 1000 ug via INTRAMUSCULAR
  Filled 2023-06-25: qty 1

## 2023-07-09 ENCOUNTER — Inpatient Hospital Stay: Payer: Medicare PPO

## 2023-07-30 ENCOUNTER — Inpatient Hospital Stay: Attending: Oncology

## 2023-07-30 DIAGNOSIS — D510 Vitamin B12 deficiency anemia due to intrinsic factor deficiency: Secondary | ICD-10-CM | POA: Insufficient documentation

## 2023-07-30 MED ORDER — CYANOCOBALAMIN 1000 MCG/ML IJ SOLN
1000.0000 ug | Freq: Once | INTRAMUSCULAR | Status: AC
  Administered 2023-07-30: 1000 ug via INTRAMUSCULAR
  Filled 2023-07-30: qty 1

## 2023-08-26 NOTE — Progress Notes (Signed)
 [facility] Telephone:(336) [phone]   Fax:(336) [phone]  PROGRESS NOTE  Patient Care Team: Plotnikov, Oakley Bellman, MD as PCP - General (Internal Medicine) Aleen Ammons, CNM as Referring Physician (Certified Nurse Midwife) Lanita Pitman, MD as Consulting Physician (Gastroenterology) Neil Balls, MD as Consulting Physician (Orthopedic Surgery) [facility] as Consulting Physician (Ophthalmology) Ander Bame, MD as Consulting Physician (Hematology and Oncology)  Hematological/Oncological History # Vitamin B12 Deficiency  12/27/2020: last visit with Dr. Magrinat  01/09/2022: Transition care to Dr. Rosaline Coma   Interval History:  Kristen Nielsen 73 y.o. female with medical history significant for vitamin b12 deficiency who presents for a follow up visit. The patient's last visit was on 05/28/2023. In the interim since the last visit she has continued on q 4 week IM B12 injections.   On exam today [name] reports she has been well overall in interim since her last visit.  She reports that she has had no issues with flu or coronavirus.  She notes her energy levels are "so-so".  She continues taking her B12 monthly after a once weekly ramp-up.  She reports she has not noticed much of a difference.  She continues to wake up tired and feel tired throughout the day.  She notes that she is not having any lightheadedness, dizziness, shortness of breath.  She is not having any symptoms other than some occasional memory issues.  She is taking turmeric and ginger as recommended by her daughter to help with her memory.  She otherwise denies any fevers, chills, sweats, nausea, vomiting or diarrhea.  A full 10 point ROS is otherwise negative.  MEDICAL HISTORY:  Past Medical History:  Diagnosis Date   Anemia    Arthritis    Environmental allergies    Vitamin B 12 deficiency     SURGICAL HISTORY: Past Surgical History:  Procedure Laterality Date    TONSILLECTOMY AND ADENOIDECTOMY     TOTAL KNEE ARTHROPLASTY Left 09/18/2018   Procedure: TOTAL KNEE ARTHROPLASTY;  Surgeon: Neil Balls, MD;  Location: [facility];  Service: Orthopedics;  Laterality: Left;    SOCIAL HISTORY: Social History   Socioeconomic History   Marital status: Divorced    Spouse name: Not on file   Number of children: Not on file   Years of education: Not on file   Highest education level: Not on file  Occupational History   Not on file  Tobacco Use   Smoking status: Never   Smokeless tobacco: Never  Substance and Sexual Activity   Alcohol use: Yes    Alcohol/week: 0.0 - 2.0 standard drinks of alcohol    Comment: occ   Drug use: No   Sexual activity: Not Currently    Partners: Male    Birth control/protection: Post-menopausal  Other Topics Concern   Not on file  Social History Narrative   Not on file   Social Drivers of Health   Financial Resource Strain: Low Risk  (10/28/2022)   Overall Financial Resource Strain (CARDIA)    Difficulty of Paying Living Expenses: Not hard at all  Food Insecurity: No Food Insecurity (10/28/2022)   Hunger Vital Sign    Worried About Running Out of Food in the Last Year: Never true    Ran Out of Food in the Last Year: Never true  Transportation Needs: No Transportation Needs (10/28/2022)   PRAPARE - Administrator, Civil Service (Medical): No    Lack of Transportation (Non-Medical): No  Physical  Activity: Insufficiently Active (10/28/2022)   Exercise Vital Sign    Days of Exercise per Week: 1 day    Minutes of Exercise per Session: 60 min  Stress: Stress Concern Present (10/28/2022)   [employer] of Occupational Health - Occupational Stress Questionnaire    Feeling of Stress : To some extent  Social Connections: Moderately Integrated (10/28/2022)   Social Connection and Isolation Panel [NHANES]    Frequency of Communication with Friends and Family: More than three times a week    Frequency of Social Gatherings  with Friends and Family: Three times a week    Attends Religious Services: More than 4 times per year    Active Member of Clubs or Organizations: Yes    Attends Banker Meetings: More than 4 times per year    Marital Status: Divorced  Intimate Partner Violence: Not At Risk (10/28/2022)   Humiliation, Afraid, Rape, and Kick questionnaire    Fear of Current or Ex-Partner: No    Emotionally Abused: No    Physically Abused: No    Sexually Abused: No    FAMILY HISTORY: Family History  Problem Relation Age of Onset   Hypertension Mother    Cancer Father        lung ca   Hypertension Father    Diabetes Father    Thyroid  disease Father    Breast cancer Sister 13   Cancer Brother 87       col ca    ALLERGIES:  has no known allergies.  MEDICATIONS:  Current Outpatient Medications  Medication Sig Dispense Refill   amLODipine  (NORVASC ) 5 MG tablet Take 1 tablet (5 mg total) by mouth daily. 90 tablet 1   acetaminophen  (TYLENOL ) 500 MG tablet Take 500 mg by mouth daily as needed for moderate pain.     Calcium Carbonate-Vit D-Min (CALCIUM 1200 PO) Take by mouth.     Cholecalciferol (VITAMIN D ) 50 MCG (2000 UT) tablet Take 2 tablets (4,000 Units total) by mouth daily. 100 tablet 11   meloxicam  (MOBIC ) 15 MG tablet Take 1 tablet (15 mg total) by mouth daily. 30 tablet 5   No current facility-administered medications for this visit.    REVIEW OF SYSTEMS:   Constitutional: ( - ) fevers, ( - )  chills , ( - ) night sweats Eyes: ( - ) blurriness of vision, ( - ) double vision, ( - ) watery eyes Ears, nose, mouth, throat, and face: ( - ) mucositis, ( - ) sore throat Respiratory: ( - ) cough, ( - ) dyspnea, ( - ) wheezes Cardiovascular: ( - ) palpitation, ( - ) chest discomfort, ( - ) lower extremity swelling Gastrointestinal:  ( - ) nausea, ( - ) heartburn, ( - ) change in bowel habits Skin: ( - ) abnormal skin rashes Lymphatics: ( - ) new lymphadenopathy, ( - ) easy  bruising Neurological: ( - ) numbness, ( - ) tingling, ( - ) new weaknesses Behavioral/Psych: ( - ) mood change, ( - ) new changes  All other systems were reviewed with the patient and are negative.  PHYSICAL EXAMINATION:  Vitals:   08/27/23 1127  BP: (!) 158/77  Pulse: 60  Resp: 16  Temp: 97.8 F (36.6 C)  SpO2: 100%     Filed Weights   08/27/23 1127  Weight: 182 lb (82.6 kg)      GENERAL: Well-appearing elderly African-American female, alert, no distress and comfortable SKIN: skin color, texture, turgor are normal, no  rashes or significant lesions EYES: conjunctiva are pink and non-injected, sclera clear LUNGS: clear to auscultation and percussion with normal breathing effort HEART: bradycardic with regular rhythm and no murmurs and no lower extremity edema Musculoskeletal: no cyanosis of digits and no clubbing  PSYCH: alert & oriented x 3, fluent speech NEURO: no focal motor/sensory deficits  LABORATORY DATA:  I have reviewed the data as listed    Latest Ref Rng & Units 08/27/2023   10:53 AM 05/28/2023   10:54 AM 04/16/2023   10:55 AM  CBC  WBC 4.0 - 10.5 K/uL 7.3  7.6  7.6   Hemoglobin 12.0 - 15.0 g/dL 47.8  29.5  62.1   Hematocrit 36.0 - 46.0 % 38.9  38.5  40.4   Platelets 150 - 400 K/uL 287  263  266        Latest Ref Rng & Units 08/27/2023   10:53 AM 05/28/2023   10:54 AM 04/16/2023   10:55 AM  CMP  Glucose 70 - 99 mg/dL 99  308  657   BUN 8 - 23 mg/dL 19  18  15    Creatinine 0.44 - 1.00 mg/dL 8.46  9.62  9.52   Sodium 135 - 145 mmol/L 139  140  137   Potassium 3.5 - 5.1 mmol/L 4.2  4.3  4.1   Chloride 98 - 111 mmol/L 108  107  107   CO2 22 - 32 mmol/L 25  29  26    Calcium 8.9 - 10.3 mg/dL 9.7  9.6  9.8   Total Protein 6.5 - 8.1 g/dL 7.3  7.0  7.0   Total Bilirubin 0.0 - 1.2 mg/dL 0.5  0.4  0.5   Alkaline Phos 38 - 126 U/L 104  105  98   AST 15 - 41 U/L 19  17  16    ALT 0 - 44 U/L 15  16  13      RADIOGRAPHIC STUDIES: No results found.  ASSESSMENT &  PLAN SHEMIKA ROBBS 73 y.o. female with medical history significant for vitamin b12 deficiency who presents for a follow up visit.  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 -- Labs today show WBC 7.3, hemoglobin 12.5, MCV 81.6, platelets 287, creatinine and LFTs normal. Vitamin B12 level 464 -- Continue vitamin B12 injections 1000 mcg but patient is requesting to increase frequency to every 4 weeks instead which is appropriate since vitamin B12 levels are low end of normal.  --RTC for monthly B12 injection and follow up visit in 6 months.   #Bradycardia: --HR is 50 bpm during visit.  --EKG showed sinus bradycardia similar to EKG from June 2020.  --Patient adds that her mother and sister both have lower HR at baseline --Advised to monitor for any chest discomfort, shortness of breath or worsening fatigue. If there is any new symptom, she will follow up with PCP to further evaluate.   No orders of the defined types were placed in this encounter.   All questions were answered. The patient knows to call the clinic with any problems, questions or concerns.  A total of more than 30 minutes were spent on this encounter with face-to-face time and non-face-to-face time, including preparing to see the patient, ordering tests and/or medications, counseling the patient and coordination of care as outlined above.   Rogerio Clay, MD Department of Hematology/Oncology [facility] at [facility] Phone:  [phone] Pager: [phone] Email: [email or web address]    08/31/2023 2:00 PM

## 2023-08-27 ENCOUNTER — Inpatient Hospital Stay: Admitting: Hematology and Oncology

## 2023-08-27 ENCOUNTER — Encounter: Payer: Self-pay | Admitting: Oncology

## 2023-08-27 ENCOUNTER — Inpatient Hospital Stay: Attending: Oncology

## 2023-08-27 ENCOUNTER — Inpatient Hospital Stay

## 2023-08-27 VITALS — BP 158/77 | HR 60 | Temp 97.8°F | Resp 16 | Wt 182.0 lb

## 2023-08-27 DIAGNOSIS — E538 Deficiency of other specified B group vitamins: Secondary | ICD-10-CM | POA: Diagnosis not present

## 2023-08-27 DIAGNOSIS — D510 Vitamin B12 deficiency anemia due to intrinsic factor deficiency: Secondary | ICD-10-CM

## 2023-08-27 LAB — CBC WITH DIFFERENTIAL (CANCER CENTER ONLY)
Abs Immature Granulocytes: 0.03 10*3/uL (ref 0.00–0.07)
Basophils Absolute: 0 10*3/uL (ref 0.0–0.1)
Basophils Relative: 1 %
Eosinophils Absolute: 0.3 10*3/uL (ref 0.0–0.5)
Eosinophils Relative: 4 %
HCT: 38.9 % (ref 36.0–46.0)
Hemoglobin: 12.5 g/dL (ref 12.0–15.0)
Immature Granulocytes: 0 %
Lymphocytes Relative: 14 %
Lymphs Abs: 1.1 10*3/uL (ref 0.7–4.0)
MCH: 26.2 pg (ref 26.0–34.0)
MCHC: 32.1 g/dL (ref 30.0–36.0)
MCV: 81.6 fL (ref 80.0–100.0)
Monocytes Absolute: 0.7 10*3/uL (ref 0.1–1.0)
Monocytes Relative: 10 %
Neutro Abs: 5.2 10*3/uL (ref 1.7–7.7)
Neutrophils Relative %: 71 %
Platelet Count: 287 10*3/uL (ref 150–400)
RBC: 4.77 MIL/uL (ref 3.87–5.11)
RDW: 13.2 % (ref 11.5–15.5)
WBC Count: 7.3 10*3/uL (ref 4.0–10.5)
nRBC: 0 % (ref 0.0–0.2)

## 2023-08-27 LAB — CMP (CANCER CENTER ONLY)
ALT: 15 U/L (ref 0–44)
AST: 19 U/L (ref 15–41)
Albumin: 4.3 g/dL (ref 3.5–5.0)
Alkaline Phosphatase: 104 U/L (ref 38–126)
Anion gap: 6 (ref 5–15)
BUN: 19 mg/dL (ref 8–23)
CO2: 25 mmol/L (ref 22–32)
Calcium: 9.7 mg/dL (ref 8.9–10.3)
Chloride: 108 mmol/L (ref 98–111)
Creatinine: 0.84 mg/dL (ref 0.44–1.00)
GFR, Estimated: >60 mL/min (ref >60)
Glucose, Bld: 99 mg/dL (ref 70–99)
Potassium: 4.2 mmol/L (ref 3.5–5.1)
Sodium: 139 mmol/L (ref 135–145)
Total Bilirubin: 0.5 mg/dL (ref 0.0–1.2)
Total Protein: 7.3 g/dL (ref 6.5–8.1)

## 2023-08-27 LAB — VITAMIN B12: Vitamin B-12: 464 pg/mL (ref 180–914)

## 2023-08-27 MED ORDER — CYANOCOBALAMIN 1000 MCG/ML IJ SOLN
1000.0000 ug | Freq: Once | INTRAMUSCULAR | Status: AC
  Administered 2023-08-27: 1000 ug via INTRAMUSCULAR
  Filled 2023-08-27: qty 1

## 2023-08-27 MED ORDER — AMLODIPINE BESYLATE 5 MG PO TABS: 5.0000 mg | ORAL_TABLET | Freq: Every day | ORAL | 1 refills | Status: DC

## 2023-08-31 ENCOUNTER — Encounter: Payer: Self-pay | Admitting: Oncology

## 2023-09-24 ENCOUNTER — Inpatient Hospital Stay: Attending: Oncology

## 2023-09-24 VITALS — BP 128/66

## 2023-09-24 DIAGNOSIS — E538 Deficiency of other specified B group vitamins: Secondary | ICD-10-CM | POA: Insufficient documentation

## 2023-09-24 DIAGNOSIS — D510 Vitamin B12 deficiency anemia due to intrinsic factor deficiency: Secondary | ICD-10-CM

## 2023-09-24 MED ORDER — CYANOCOBALAMIN 1000 MCG/ML IJ SOLN
1000.0000 ug | Freq: Once | INTRAMUSCULAR | Status: AC
  Administered 2023-09-24: 1000 ug via INTRAMUSCULAR
  Filled 2023-09-24: qty 1

## 2023-10-02 DIAGNOSIS — H35342 Macular cyst, hole, or pseudohole, left eye: Secondary | ICD-10-CM | POA: Diagnosis not present

## 2023-10-02 DIAGNOSIS — Z961 Presence of intraocular lens: Secondary | ICD-10-CM | POA: Diagnosis not present

## 2023-10-02 DIAGNOSIS — H40013 Open angle with borderline findings, low risk, bilateral: Secondary | ICD-10-CM | POA: Diagnosis not present

## 2023-10-02 DIAGNOSIS — H04123 Dry eye syndrome of bilateral lacrimal glands: Secondary | ICD-10-CM | POA: Diagnosis not present

## 2023-10-06 DIAGNOSIS — H43392 Other vitreous opacities, left eye: Secondary | ICD-10-CM | POA: Diagnosis not present

## 2023-10-06 DIAGNOSIS — H35373 Puckering of macula, bilateral: Secondary | ICD-10-CM | POA: Diagnosis not present

## 2023-10-06 DIAGNOSIS — H35342 Macular cyst, hole, or pseudohole, left eye: Secondary | ICD-10-CM | POA: Diagnosis not present

## 2023-10-28 ENCOUNTER — Telehealth: Payer: Self-pay | Admitting: Hematology and Oncology

## 2023-10-29 ENCOUNTER — Ambulatory Visit: Payer: Medicare PPO

## 2023-10-29 ENCOUNTER — Inpatient Hospital Stay

## 2023-10-29 DIAGNOSIS — H35373 Puckering of macula, bilateral: Secondary | ICD-10-CM | POA: Diagnosis not present

## 2023-10-29 DIAGNOSIS — H35342 Macular cyst, hole, or pseudohole, left eye: Secondary | ICD-10-CM | POA: Diagnosis not present

## 2023-10-29 DIAGNOSIS — H43392 Other vitreous opacities, left eye: Secondary | ICD-10-CM | POA: Diagnosis not present

## 2023-10-29 DIAGNOSIS — H5315 Visual distortions of shape and size: Secondary | ICD-10-CM | POA: Diagnosis not present

## 2023-11-04 DIAGNOSIS — H35342 Macular cyst, hole, or pseudohole, left eye: Secondary | ICD-10-CM | POA: Diagnosis not present

## 2023-11-18 ENCOUNTER — Ambulatory Visit

## 2023-11-18 VITALS — Ht 66.0 in | Wt 182.0 lb

## 2023-11-18 DIAGNOSIS — Z0000 Encounter for general adult medical examination without abnormal findings: Secondary | ICD-10-CM

## 2023-11-18 NOTE — Progress Notes (Cosign Needed Addendum)
 Subjective:   Kristen Nielsen is a 73 y.o. who presents for a Medicare Wellness preventive visit.  As a reminder, Annual Wellness Visits don't include a physical exam, and some assessments may be limited, especially if this visit is performed virtually. We may recommend an in-person follow-up visit with your provider if needed.  Visit Complete: Virtual I connected with  Kristen Nielsen on 11/18/23 by a audio enabled telemedicine application and verified that I am speaking with the correct person using two identifiers.  Patient Location: Home  Provider Location: Office/Clinic  I discussed the limitations of evaluation and management by telemedicine. The patient expressed understanding and agreed to proceed.  Vital Signs: Because this visit was a virtual/telehealth visit, some criteria may be missing or patient reported. Any vitals not documented were not able to be obtained and vitals that have been documented are patient reported.  VideoDeclined- This patient declined Librarian, academic. Therefore the visit was completed with audio only.  Persons Participating in Visit: Patient.  AWV Questionnaire: No: Patient Medicare AWV questionnaire was not completed prior to this visit.  Cardiac Risk Factors include: advanced age (>45men, >83 women)     Objective:    Today's Vitals   11/18/23 0853  Weight: 182 lb (82.6 kg)  Height: 5' 6 (1.676 m)   Body mass index is 29.38 kg/m.     11/18/2023    8:53 AM 10/28/2022    9:50 AM 10/26/2021    9:27 AM 06/20/2020    2:47 PM 09/18/2018    4:00 PM 09/16/2018    1:36 PM 10/02/2015    1:24 PM  Advanced Directives  Does Patient Have a Medical Advance Directive? Yes No No No No No No   Type of Estate agent of [place];Living will        Copy of Healthcare Power of Attorney in Chart? No - copy requested        Would patient like information on creating a medical advance directive?  No - Patient  declined No - Patient declined No - Patient declined No - Patient declined  No - Guardian declined       Data saved with a previous flowsheet row definition    Current Medications (verified) Outpatient Encounter Medications as of 11/18/2023  Medication Sig   acetaminophen  (TYLENOL ) 500 MG tablet Take 500 mg by mouth daily as needed for moderate pain.   amLODipine  (NORVASC ) 5 MG tablet Take 1 tablet (5 mg total) by mouth daily.   Calcium Carbonate-Vit D-Min (CALCIUM 1200 PO) Take by mouth.   Cholecalciferol (VITAMIN D ) 50 MCG (2000 UT) tablet Take 2 tablets (4,000 Units total) by mouth daily.   meloxicam  (MOBIC ) 15 MG tablet Take 1 tablet (15 mg total) by mouth daily.   No facility-administered encounter medications on file as of 11/18/2023.    Allergies (verified) Patient has no known allergies.   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   Procedure: TOTAL KNEE ARTHROPLASTY;  Surgeon: Yvone Rush, MD;  Location: [facility];  Service: Orthopedics;  Laterality: Left;   Family History  Problem Relation Age of Onset   Hypertension Mother    Cancer Father        lung ca  Hypertension Father    Diabetes Father    Thyroid  disease Father    Breast cancer Sister 28   Cancer Brother 57       col ca   Social History   Socioeconomic History   Marital status: Divorced    Spouse name: Not on file   Number of children: Not on file   Years of education: Not on file   Highest education level: Not on file  Occupational History   Not on file  Tobacco Use   Smoking status: Never   Smokeless tobacco: Never  Substance and Sexual Activity   Alcohol use: Not Currently    Alcohol/week: 1.0 standard drink of alcohol    Types: 1 Shots of liquor per week    Comment: occ   Drug  use: No   Sexual activity: Not Currently    Partners: Male    Birth control/protection: Post-menopausal  Other Topics Concern   Not on file  Social History Narrative   Not on file   Social Drivers of Health   Financial Resource Strain: Low Risk  (11/18/2023)   Overall Financial Resource Strain (CARDIA)    Difficulty of Paying Living Expenses: Not hard at all  Food Insecurity: No Food Insecurity (11/18/2023)   Hunger Vital Sign    Worried About Running Out of Food in the Last Year: Never true    Ran Out of Food in the Last Year: Never true  Transportation Needs: No Transportation Needs (11/18/2023)   PRAPARE - Administrator, Civil Service (Medical): No    Lack of Transportation (Non-Medical): No  Physical Activity: Insufficiently Active (11/18/2023)   Exercise Vital Sign    Days of Exercise per Week: 5 days    Minutes of Exercise per Session: 20 min  Stress: No Stress Concern Present (11/18/2023)   [employer] of Occupational Health - Occupational Stress Questionnaire    Feeling of Stress: Not at all  Social Connections: Moderately Integrated (11/18/2023)   Social Connection and Isolation Panel    Frequency of Communication with Friends and Family: More than three times a week    Frequency of Social Gatherings with Friends and Family: Three times a week    Attends Religious Services: More than 4 times per year    Active Member of Clubs or Organizations: Yes    Attends Engineer, structural: More than 4 times per year    Marital Status: Divorced    Tobacco Counseling Counseling given: No    Clinical Intake:  Pre-visit preparation completed: Yes  Pain : No/denies pain     BMI - recorded: 29.38 Nutritional Status: BMI 25 -29 Overweight Nutritional Risks: None Diabetes: No  No results found for: HGBA1C   How often do you need to have someone help you when you read instructions, pamphlets, or other written materials from your doctor or  pharmacy?: 1 - Never  Interpreter Needed?: No  Information entered by :: Verdie Saba, CMA   Activities of Daily Living     11/18/2023    8:56 AM  In your present state of health, do you have any difficulty performing the following activities:  Hearing? 0  Vision? 0  Difficulty concentrating or making decisions? 0  Walking or climbing stairs? 0  Dressing or bathing? 0  Doing errands, shopping? 0  Preparing Food and eating ? N  Using the Toilet? N  In the past six months, have you accidently leaked urine? N  Do you have  problems with loss of bowel control? N  Managing your Medications? N  Managing your Finances? N  Housekeeping or managing your Housekeeping? N    Patient Care Team: Plotnikov, Karlynn GAILS, MD as PCP - General (Internal Medicine) Rodgers Barnie RAMAN, CNM as Referring Physician (Certified Nurse Midwife) Donnald Charleston, MD as Consulting Physician (Gastroenterology) Yvone Rush, MD as Consulting Physician (Orthopedic Surgery) [facility] as Consulting Physician (Ophthalmology) Federico Rush [name], MD as Consulting Physician (Hematology and Oncology) Tobie Bamberger, OD as Referring Physician (Optometry)  I have updated your Care Teams any recent Medical Services you may have received from other providers in the past year.     Assessment:   This is a routine wellness examination for [place].  Hearing/Vision screen Hearing Screening - Comments:: Denies hearing difficulties   Vision Screening - Comments:: Wears eyeglasses for reading - up to date with routine eye exams with [facility] &[facility]; Dr Tobie   Goals Addressed               This Visit's Progress     Patient Stated (pt-stated)        Patient stated she plans to continue to stay active       Depression Screen     11/18/2023    8:57 AM 10/28/2022    9:58 AM 10/26/2021    9:27 AM 10/26/2021    9:25 AM 07/17/2021    7:53 AM 07/17/2021    7:52 AM 06/20/2020    2:41 PM  PHQ 2/9  Scores  PHQ - 2 Score 0 0 0 0 1 1 0  PHQ- 9 Score 0 2   4      Fall Risk     11/18/2023    8:56 AM 12/05/2022   10:25 AM 10/28/2022   10:01 AM 10/27/2022   10:03 PM 10/26/2021    9:27 AM  Fall Risk   Falls in the past year? 1 0 0 0 0  Number falls in past yr: 0 0 0  0  Comment 1      Injury with Fall? 0 0 0  0  Risk for fall due to :   No Fall Risks    Follow up Falls evaluation completed;Falls prevention discussed Falls evaluation completed Falls prevention discussed  Falls evaluation completed;Education provided      Data saved with a previous flowsheet row definition    MEDICARE RISK AT HOME:  Medicare Risk at Home Any stairs in or around the home?: Yes If so, are there any without handrails?: No Home free of loose throw rugs in walkways, pet beds, electrical cords, etc?: Yes Adequate lighting in your home to reduce risk of falls?: Yes Life alert?: No Use of a cane, walker or w/c?: No Grab bars in the bathroom?: No Shower chair or bench in shower?: Yes Elevated toilet seat or a handicapped toilet?: Yes  TIMED UP AND GO:  Was the test performed?  No  Cognitive Function: 6CIT completed        11/18/2023    8:59 AM 10/28/2022   10:02 AM 10/26/2021    9:32 AM  6CIT Screen  What Year? 0 points 0 points 0 points  What month? 0 points 0 points 0 points  What time? 0 points 0 points 0 points  Count back from 20 0 points 0 points 0 points  Months in reverse 0 points 0 points 0 points  Repeat phrase 0 points 0 points 0 points  Total Score 0 points 0 points 0 points    Immunizations Immunization History  Administered Date(s) Administered   Fluad Quad(high Dose 65+) 03/10/2021   Fluad Trivalent(High Dose 65+) 12/05/2022   INFLUENZA, HIGH DOSE SEASONAL PF 02/28/2022   Influenza,inj,Quad PF,6+ Mos 04/19/2013, 11/24/2013   PFIZER(Purple Top)SARS-COV-2 Vaccination 04/29/2019, 05/20/2019, 12/30/2019   Pfizer Covid-19 Vaccine Bivalent Booster 43yrs & up 03/10/2021    Pfizer(Comirnaty)Fall Seasonal Vaccine 12 years and older 02/28/2022   Pneumococcal Conjugate-13 07/30/2016, 08/23/2017   Tdap 04/19/2013   Zoster Recombinant(Shingrix) 08/02/2016, 08/23/2017    Screening Tests Health Maintenance  Topic Date Due   Hepatitis C Screening  Never done   Pneumococcal Vaccine: 50+ Years (2 of 2 - PCV20 or PCV21) 08/24/2018   COVID-19 Vaccine (6 - 2024-25 season) 11/24/2022   DTaP/Tdap/Td (2 - Td or Tdap) 04/20/2023   INFLUENZA VACCINE  10/24/2023   MAMMOGRAM  12/06/2023   Medicare Annual Wellness (AWV)  11/17/2024   Colonoscopy  12/05/2030   DEXA SCAN  Completed   Zoster Vaccines- Shingrix  Completed   HPV VACCINES  Aged Out   Meningococcal B Vaccine  Aged Out    Health Maintenance  Health Maintenance Due  Topic Date Due   Hepatitis C Screening  Never done   Pneumococcal Vaccine: 50+ Years (2 of 2 - PCV20 or PCV21) 08/24/2018   COVID-19 Vaccine (6 - 2024-25 season) 11/24/2022   DTaP/Tdap/Td (2 - Td or Tdap) 04/20/2023   INFLUENZA VACCINE  10/24/2023   Health Maintenance Items Addressed: 11/18/2023  Additional Screening:  Vision Screening: Recommended annual ophthalmology exams for early detection of glaucoma and other disorders of the eye. Would you like a referral to an eye doctor? No    Dental Screening: Recommended annual dental exams for proper oral hygiene  Community Resource Referral / Chronic Care Management: CRR required this visit?  No   CCM required this visit?  No   Plan:    I have personally reviewed and noted the following in the patient's chart:   Medical and social history Use of alcohol, tobacco or illicit drugs  Current medications and supplements including opioid prescriptions. Patient is not currently taking opioid prescriptions. Functional ability and status Nutritional status Physical activity Advanced directives List of other physicians Hospitalizations, surgeries, and ER visits in previous 12  months Vitals Screenings to include cognitive, depression, and falls Referrals and appointments  In addition, I have reviewed and discussed with patient certain preventive protocols, quality metrics, and best practice recommendations. A written personalized care plan for preventive services as well as general preventive health recommendations were provided to patient.   Verdie CHRISTELLA Saba, CMA   11/18/2023   After Visit Summary: (MyChart) Due to this being a telephonic visit, the after visit summary with patients personalized plan was offered to patient via MyChart   Notes: Scheduled a 1-yr Physical w/PCP for 12/08/2023.  Medical screening examination/treatment/procedure(s) were performed by non-physician practitioner and as supervising physician I was immediately available for consultation/collaboration.  I agree with above. Karlynn Noel, MD

## 2023-11-18 NOTE — Patient Instructions (Addendum)
 Ms. All , Thank you for taking time out of your busy schedule to complete your Annual Wellness Visit with me. I enjoyed our conversation and look forward to speaking with you again next year. I, as well as your care team,  appreciate your ongoing commitment to your health goals. Please review the following plan we discussed and let me know if I can assist you in the future. Your Game plan/ To Do List    Referrals: If you haven't heard from the office you've been referred to, please reach out to them at the phone provided.   Follow up Visits: We will see or speak with you next year for your Next Medicare AWV with our clinical staff Have you seen your provider in the last 6 months (3 months if uncontrolled diabetes)? No  Clinician Recommendations:  Aim for 30 minutes of exercise or brisk walking, 6-8 glasses of water , and 5 servings of fruits and vegetables each day. Educated and advised on getting the Pneumonia, Tdap, and Influenza vaccines in 2025.      This is a list of the screenings recommended for you:  Health Maintenance  Topic Date Due   Hepatitis C Screening  Never done   Pneumococcal Vaccine for age over 31 (2 of 2 - PCV20 or PCV21) 08/24/2018   COVID-19 Vaccine (6 - 2024-25 season) 11/24/2022   DTaP/Tdap/Td vaccine (2 - Td or Tdap) 04/20/2023   Flu Shot  10/24/2023   Mammogram  12/06/2023   Medicare Annual Wellness Visit  11/17/2024   Colon Cancer Screening  12/05/2030   DEXA scan (bone density measurement)  Completed   Zoster (Shingles) Vaccine  Completed   HPV Vaccine  Aged Out   Meningitis B Vaccine  Aged Out    Advanced directives: (Copy Requested) Please bring a copy of your health care power of attorney and living will to the office to be added to your chart at your convenience. You can mail to [facility] [address] [place], [place] [address] or email to [email or web address] Advance Care Planning is important because it:  [x]  Makes sure  you receive the medical care that is consistent with your values, goals, and preferences  [x]  It provides guidance to your family and loved ones and reduces their decisional burden about whether or not they are making the right decisions based on your wishes.  Follow the link provided in your after visit summary or read over the paperwork we have mailed to you to help you started getting your Advance Directives in place. If you need assistance in completing these, please reach out to us  so that we can help you!

## 2023-11-26 ENCOUNTER — Other Ambulatory Visit: Payer: Self-pay | Admitting: Hematology and Oncology

## 2023-11-26 ENCOUNTER — Inpatient Hospital Stay

## 2023-11-26 ENCOUNTER — Inpatient Hospital Stay: Attending: Oncology

## 2023-11-26 ENCOUNTER — Inpatient Hospital Stay (HOSPITAL_BASED_OUTPATIENT_CLINIC_OR_DEPARTMENT_OTHER): Admitting: Hematology and Oncology

## 2023-11-26 VITALS — BP 137/81 | HR 50 | Temp 97.1°F | Resp 13 | Wt 183.8 lb

## 2023-11-26 DIAGNOSIS — R001 Bradycardia, unspecified: Secondary | ICD-10-CM | POA: Diagnosis not present

## 2023-11-26 DIAGNOSIS — D510 Vitamin B12 deficiency anemia due to intrinsic factor deficiency: Secondary | ICD-10-CM

## 2023-11-26 DIAGNOSIS — E538 Deficiency of other specified B group vitamins: Secondary | ICD-10-CM | POA: Insufficient documentation

## 2023-11-26 LAB — CMP (CANCER CENTER ONLY)
ALT: 14 U/L (ref 0–44)
AST: 17 U/L (ref 15–41)
Albumin: 4.1 g/dL (ref 3.5–5.0)
Alkaline Phosphatase: 104 U/L (ref 38–126)
Anion gap: 5 (ref 5–15)
BUN: 13 mg/dL (ref 8–23)
CO2: 29 mmol/L (ref 22–32)
Calcium: 9.8 mg/dL (ref 8.9–10.3)
Chloride: 107 mmol/L (ref 98–111)
Creatinine: 0.72 mg/dL (ref 0.44–1.00)
GFR, Estimated: >60 mL/min (ref >60)
Glucose, Bld: 98 mg/dL (ref 70–99)
Potassium: 4.1 mmol/L (ref 3.5–5.1)
Sodium: 141 mmol/L (ref 135–145)
Total Bilirubin: 0.5 mg/dL (ref 0.0–1.2)
Total Protein: 7.2 g/dL (ref 6.5–8.1)

## 2023-11-26 LAB — CBC WITH DIFFERENTIAL (CANCER CENTER ONLY)
Abs Immature Granulocytes: 0.02 K/uL (ref 0.00–0.07)
Basophils Absolute: 0 K/uL (ref 0.0–0.1)
Basophils Relative: 0 %
Eosinophils Absolute: 0.4 K/uL (ref 0.0–0.5)
Eosinophils Relative: 5 %
HCT: 36.2 % (ref 36.0–46.0)
Hemoglobin: 11.9 g/dL — ABNORMAL LOW (ref 12.0–15.0)
Immature Granulocytes: 0 %
Lymphocytes Relative: 14 %
Lymphs Abs: 1 K/uL (ref 0.7–4.0)
MCH: 26.8 pg (ref 26.0–34.0)
MCHC: 32.9 g/dL (ref 30.0–36.0)
MCV: 81.5 fL (ref 80.0–100.0)
Monocytes Absolute: 0.7 K/uL (ref 0.1–1.0)
Monocytes Relative: 9 %
Neutro Abs: 5.5 K/uL (ref 1.7–7.7)
Neutrophils Relative %: 72 %
Platelet Count: 235 K/uL (ref 150–400)
RBC: 4.44 MIL/uL (ref 3.87–5.11)
RDW: 13.5 % (ref 11.5–15.5)
WBC Count: 7.7 K/uL (ref 4.0–10.5)
nRBC: 0 % (ref 0.0–0.2)

## 2023-11-26 LAB — VITAMIN B12: Vitamin B-12: 500 pg/mL (ref 180–914)

## 2023-11-26 MED ORDER — CYANOCOBALAMIN 1000 MCG/ML IJ SOLN
1000.0000 ug | Freq: Once | INTRAMUSCULAR | Status: AC
  Administered 2023-11-26: 1000 ug via INTRAMUSCULAR
  Filled 2023-11-26: qty 1

## 2023-11-26 NOTE — Progress Notes (Unsigned)
 [facility] [phone]   [phone]  PROGRESS NOTE  Patient Care Team: Plotnikov, Karlynn GAILS, MD as PCP - General (Internal Medicine) Rodgers Barnie RAMAN, CNM as Referring Physician (Certified Nurse Midwife) Donnald Charleston, MD as Consulting Physician (Gastroenterology) Yvone Rush, MD as Consulting Physician (Orthopedic Surgery) [facility] as Consulting Physician (Ophthalmology) Federico Rush [name], MD as Consulting Physician (Hematology and Oncology) Tobie Bamberger, OD as Referring Physician (Optometry)  Hematological/Oncological History # Vitamin B12 Deficiency  12/27/2020: last visit with Dr. Magrinat  01/09/2022: Transition care to Dr. Federico   Interval History:  Kristen Nielsen 73 y.o. female with medical history significant for vitamin b12 deficiency who presents for a follow up visit. The patient's last visit was on 08/27/2023. In the interim since the last visit she has continued on q 4 week IM B12 injections.   On exam today Kristen Nielsen reports she has been well overall in the interim since her last visit.  She reports over the holiday weekend her grandson turned 20 years old.  She reports that she has been feeling okay.  She notes her energy levels are steady.  She notes that she has been having ongoing eye surgery work as a result of a detached retina with Dr. Tobie.  She reports she is not having any lightheadedness, dizziness, or shortness of breath.  She notes that she is tolerating her vitamin B12 shots without any difficulty.  She notes that she otherwise has been at her baseline level of health with no recent fevers, chills, sweats, nausea, vomiting or diarrhea.  A full 10 point ROS is otherwise negative.  Overall she is willing and able to continue on her vitamin B12 injections at this time.  MEDICAL HISTORY:  Past Medical History:  Diagnosis Date   Anemia    Arthritis    Environmental allergies    Vitamin B 12  deficiency     SURGICAL HISTORY: Past Surgical History:  Procedure Laterality Date   TONSILLECTOMY AND ADENOIDECTOMY     TOTAL KNEE ARTHROPLASTY Left 09/18/2018   Procedure: TOTAL KNEE ARTHROPLASTY;  Surgeon: Yvone Rush, MD;  Location: [facility];  Service: Orthopedics;  Laterality: Left;    SOCIAL HISTORY: Social History   Socioeconomic History   Marital status: Divorced    Spouse name: Not on file   Number of children: Not on file   Years of education: Not on file   Highest education level: Not on file  Occupational History   Not on file  Tobacco Use   Smoking status: Never   Smokeless tobacco: Never  Substance and Sexual Activity   Alcohol use: Not Currently    Alcohol/week: 1.0 standard drink of alcohol    Types: 1 Shots of liquor per week    Comment: occ   Drug use: No   Sexual activity: Not Currently    Partners: Male    Birth control/protection: Post-menopausal  Other Topics Concern   Not on file  Social History Narrative   Not on file   Social Drivers of Health   Financial Resource Strain: Low Risk  (11/18/2023)   Overall Financial Resource Strain (CARDIA)    Difficulty of Paying Living Expenses: Not hard at all  Food Insecurity: No Food Insecurity (11/18/2023)   Hunger Vital Sign    Worried About Running Out of Food in the Last Year: Never true    Ran Out of Food in the Last Year: Never true  Transportation Needs: No Transportation  Needs (11/18/2023)   PRAPARE - Administrator, Civil Service (Medical): No    Lack of Transportation (Non-Medical): No  Physical Activity: Insufficiently Active (11/18/2023)   Exercise Vital Sign    Days of Exercise per Week: 5 days    Minutes of Exercise per Session: 20 min  Stress: No Stress Concern Present (11/18/2023)   [employer] of Occupational Health - Occupational Stress Questionnaire    Feeling of Stress: Not at all  Social Connections: Moderately Integrated (11/18/2023)   Social Connection and  Isolation Panel    Frequency of Communication with Friends and Family: More than three times a week    Frequency of Social Gatherings with Friends and Family: Three times a week    Attends Religious Services: More than 4 times per year    Active Member of Clubs or Organizations: Yes    Attends Banker Meetings: More than 4 times per year    Marital Status: Divorced  Intimate Partner Violence: Not At Risk (11/18/2023)   Humiliation, Afraid, Rape, and Kick questionnaire    Fear of Current or Ex-Partner: No    Emotionally Abused: No    Physically Abused: No    Sexually Abused: No    FAMILY HISTORY: Family History  Problem Relation Age of Onset   Hypertension Mother    Cancer Father        lung ca   Hypertension Father    Diabetes Father    Thyroid  disease Father    Breast cancer Sister 67   Cancer Brother 4       col ca    ALLERGIES:  has no known allergies.  MEDICATIONS:  Current Outpatient Medications  Medication Sig Dispense Refill   acetaminophen  (TYLENOL ) 500 MG tablet Take 500 mg by mouth daily as needed for moderate pain.     amLODipine  (NORVASC ) 5 MG tablet Take 1 tablet (5 mg total) by mouth daily. 90 tablet 1   Calcium Carbonate-Vit D-Min (CALCIUM 1200 PO) Take by mouth.     Cholecalciferol (VITAMIN D ) 50 MCG (2000 UT) tablet Take 2 tablets (4,000 Units total) by mouth daily. 100 tablet 11   meloxicam  (MOBIC ) 15 MG tablet Take 1 tablet (15 mg total) by mouth daily. 30 tablet 5   No current facility-administered medications for this visit.    REVIEW OF SYSTEMS:   Constitutional: ( - ) fevers, ( - )  chills , ( - ) night sweats Eyes: ( - ) blurriness of vision, ( - ) double vision, ( - ) watery eyes Ears, nose, mouth, throat, and face: ( - ) mucositis, ( - ) sore throat Respiratory: ( - ) cough, ( - ) dyspnea, ( - ) wheezes Cardiovascular: ( - ) palpitation, ( - ) chest discomfort, ( - ) lower extremity swelling Gastrointestinal:  ( - ) nausea, ( - )  heartburn, ( - ) change in bowel habits Skin: ( - ) abnormal skin rashes Lymphatics: ( - ) new lymphadenopathy, ( - ) easy bruising Neurological: ( - ) numbness, ( - ) tingling, ( - ) new weaknesses Behavioral/Psych: ( - ) mood change, ( - ) new changes  All other systems were reviewed with the patient and are negative.  PHYSICAL EXAMINATION:  Vitals:   11/26/23 1054  BP: 137/81  Pulse: (!) 50  Resp: 13  Temp: (!) 97.1 F (36.2 C)  SpO2: 100%      Filed Weights   11/26/23 1054  Weight: 183  lb 12.8 oz (83.4 kg)       GENERAL: Well-appearing elderly African-American female, alert, no distress and comfortable SKIN: skin color, texture, turgor are normal, no rashes or significant lesions EYES: conjunctiva are pink and non-injected, sclera clear LUNGS: clear to auscultation and percussion with normal breathing effort HEART: bradycardic with regular rhythm and no murmurs and no lower extremity edema Musculoskeletal: no cyanosis of digits and no clubbing  PSYCH: alert & oriented x 3, fluent speech NEURO: no focal motor/sensory deficits  LABORATORY DATA:  I have reviewed the data as listed    Latest Ref Rng & Units 11/26/2023   10:28 AM 08/27/2023   10:53 AM 05/28/2023   10:54 AM  CBC  WBC 4.0 - 10.5 K/uL 7.7  7.3  7.6   Hemoglobin 12.0 - 15.0 g/dL 88.0  87.4  87.7   Hematocrit 36.0 - 46.0 % 36.2  38.9  38.5   Platelets 150 - 400 K/uL 235  287  263        Latest Ref Rng & Units 11/26/2023   10:28 AM 08/27/2023   10:53 AM 05/28/2023   10:54 AM  CMP  Glucose 70 - 99 mg/dL 98  99  896   BUN 8 - 23 mg/dL 13  19  18    Creatinine 0.44 - 1.00 mg/dL 9.27  9.15  9.15   Sodium 135 - 145 mmol/L 141  139  140   Potassium 3.5 - 5.1 mmol/L 4.1  4.2  4.3   Chloride 98 - 111 mmol/L 107  108  107   CO2 22 - 32 mmol/L 29  25  29    Calcium 8.9 - 10.3 mg/dL 9.8  9.7  9.6   Total Protein 6.5 - 8.1 g/dL 7.2  7.3  7.0   Total Bilirubin 0.0 - 1.2 mg/dL 0.5  0.5  0.4   Alkaline Phos 38 - 126  U/L 104  104  105   AST 15 - 41 U/L 17  19  17    ALT 0 - 44 U/L 14  15  16      RADIOGRAPHIC STUDIES: No results found.  ASSESSMENT & PLAN Kristen Nielsen 73 y.o. female with medical history significant for vitamin b12 deficiency who presents for a follow up visit.  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 -- Labs today show WBC 7.7, hemoglobin 11.9, MCV 81.5, platelets 235, creatinine and LFTs normal. Vitamin B12 level 464 -- Continue vitamin B12 injections 1000 mcg but patient is requesting to increase frequency to every 4 weeks instead which is appropriate since vitamin B12 levels are low end of normal.  --RTC for monthly B12 injection and follow up visit in 3 months.   #Bradycardia: --HR is 50 bpm during visit.  --EKG showed sinus bradycardia similar to EKG from June 2020.  --Patient adds that her mother and sister both have lower HR at baseline --Advised to monitor for any chest discomfort, shortness of breath or worsening fatigue. If there is any new symptom, she will follow up with PCP to further evaluate.   No orders of the defined types were placed in this encounter.   All questions were answered. The patient knows to call the clinic with any problems, questions or concerns.  A total of more than 30 minutes were spent on this encounter with face-to-face time and non-face-to-face time, including preparing to see the patient, ordering tests and/or  medications, counseling the patient and coordination of care as outlined above.   Norleen IVAR Kidney, MD Department of Hematology/Oncology [facility] at [facility] Phone: [phone] Pager: [phone] Email: [email or web address]    11/27/2023 11:31 AM

## 2023-11-27 ENCOUNTER — Encounter: Payer: Self-pay | Admitting: Oncology

## 2023-11-29 LAB — METHYLMALONIC ACID, SERUM: Methylmalonic Acid, Quantitative: 104 nmol/L (ref 0–378)

## 2023-12-08 ENCOUNTER — Ambulatory Visit (INDEPENDENT_AMBULATORY_CARE_PROVIDER_SITE_OTHER): Admitting: Internal Medicine

## 2023-12-08 ENCOUNTER — Encounter: Payer: Self-pay | Admitting: Internal Medicine

## 2023-12-08 VITALS — BP 128/76 | HR 68 | Temp 98.0°F | Ht 66.0 in | Wt 184.4 lb

## 2023-12-08 DIAGNOSIS — E538 Deficiency of other specified B group vitamins: Secondary | ICD-10-CM

## 2023-12-08 DIAGNOSIS — Z23 Encounter for immunization: Secondary | ICD-10-CM | POA: Diagnosis not present

## 2023-12-08 DIAGNOSIS — R739 Hyperglycemia, unspecified: Secondary | ICD-10-CM | POA: Diagnosis not present

## 2023-12-08 DIAGNOSIS — D510 Vitamin B12 deficiency anemia due to intrinsic factor deficiency: Secondary | ICD-10-CM

## 2023-12-08 NOTE — Assessment & Plan Note (Addendum)
 F/u w/Dr Federico Chronic  On B12 inj monthly

## 2023-12-08 NOTE — Progress Notes (Signed)
 Subjective:  Patient ID: Fidela LULLA Arenas, female    DOB: [birth year]  Age: 73 y.o. MRN: [id number]  CC: Annual Exam (Wants Flu vaccine. )   HPI AZILE MINARDI presents for HTN, knee OA Eye surgery - Dr Tobie  Outpatient Medications Prior to Visit  Medication Sig Dispense Refill   acetaminophen  (TYLENOL ) 500 MG tablet Take 500 mg by mouth daily as needed for moderate pain.     amLODipine  (NORVASC ) 5 MG tablet Take 1 tablet (5 mg total) by mouth daily. 90 tablet 1   Calcium Carbonate-Vit D-Min (CALCIUM 1200 PO) Take by mouth.     Cholecalciferol (VITAMIN D ) 50 MCG (2000 UT) tablet Take 2 tablets (4,000 Units total) by mouth daily. 100 tablet 11   Turmeric 500 MG TABS Take 550 mg by mouth.     meloxicam  (MOBIC ) 15 MG tablet Take 1 tablet (15 mg total) by mouth daily. (Patient not taking: Reported on 12/08/2023) 30 tablet 5   No facility-administered medications prior to visit.    ROS: Review of Systems  Constitutional:  Negative for activity change, appetite change, chills, fatigue and unexpected weight change.  HENT:  Negative for congestion, mouth sores and sinus pressure.   Eyes:  Positive for visual disturbance.  Respiratory:  Negative for cough and chest tightness.   Gastrointestinal:  Negative for abdominal pain and nausea.  Genitourinary:  Negative for difficulty urinating, frequency and vaginal pain.  Musculoskeletal:  Negative for back pain and gait problem.  Skin:  Negative for pallor and rash.  Neurological:  Negative for dizziness, tremors, weakness, numbness and headaches.  Psychiatric/Behavioral:  Negative for confusion and sleep disturbance.     Objective:  BP 128/76   Pulse 68   Temp 98 F (36.7 C) (Oral)   Ht 5' 6 (1.676 m)   Wt 184 lb 6.4 oz (83.6 kg)   LMP 03/25/2008   SpO2 98%   BMI 29.76 kg/m   BP Readings from Last 3 Encounters:  12/08/23 128/76  11/26/23 137/81  09/24/23 128/66    Wt Readings from Last 3 Encounters:  12/08/23 184 lb 6.4 oz  (83.6 kg)  11/26/23 183 lb 12.8 oz (83.4 kg)  11/18/23 182 lb (82.6 kg)    Physical Exam Constitutional:      General: She is not in acute distress.    Appearance: She is well-developed.  HENT:     Head: Normocephalic.     Right Ear: External ear normal.     Left Ear: External ear normal.     Nose: Nose normal.  Eyes:     General:        Right eye: No discharge.        Left eye: No discharge.     Conjunctiva/sclera: Conjunctivae normal.     Pupils: Pupils are equal, round, and reactive to light.  Neck:     Thyroid : No thyromegaly.     Vascular: No JVD.     Trachea: No tracheal deviation.  Cardiovascular:     Rate and Rhythm: Normal rate and regular rhythm.     Heart sounds: Normal heart sounds.  Pulmonary:     Effort: No respiratory distress.     Breath sounds: No stridor. No wheezing.  Abdominal:     General: Bowel sounds are normal. There is no distension.     Palpations: Abdomen is soft. There is no mass.     Tenderness: There is no abdominal tenderness. There is no guarding or rebound.  Musculoskeletal:        General: No tenderness.     Cervical back: Normal range of motion and neck supple. No rigidity.     Right lower leg: No edema.     Left lower leg: No edema.  Lymphadenopathy:     Cervical: No cervical adenopathy.  Skin:    Findings: No erythema or rash.  Neurological:     Cranial Nerves: No cranial nerve deficit.     Motor: No abnormal muscle tone.     Coordination: Coordination normal.     Deep Tendon Reflexes: Reflexes normal.  Psychiatric:        Behavior: Behavior normal.        Thought Content: Thought content normal.        Judgment: Judgment normal.     Lab Results  Component Value Date   WBC 7.7 11/26/2023   HGB 11.9 (L) 11/26/2023   HCT 36.2 11/26/2023   PLT 235 11/26/2023   GLUCOSE 98 11/26/2023   CHOL 182 05/25/2020   TRIG 79.0 05/25/2020   HDL 61.00 05/25/2020   LDLCALC 105 (H) 05/25/2020   ALT 14 11/26/2023   AST 17 11/26/2023    NA 141 11/26/2023   K 4.1 11/26/2023   CL 107 11/26/2023   CREATININE 0.72 11/26/2023   BUN 13 11/26/2023   CO2 29 11/26/2023   TSH 0.774 12/27/2020   INR 1.1 09/16/2018    DG Bone Density Result Date: 01/15/2023 Table formatting from the original result was not included. Date of study: 01/13/2023 Exam: DUAL X-RAY ABSORPTIOMETRY (DXA) FOR BONE MINERAL DENSITY (BMD) Instrument: [employer] Requesting Provider: PCP Indication: follow up for low BMD Comparison: 2018 Clinical data: Pt is a 73 y.o. female without previous history of fracture. Results:  Lumbar spine L1-L4 Femoral neck (FN) T-score 0.0 RFN: -1.2 LFN: -1.2 Change in BMD from previous DXA test (%) Down 8.3%* Down 7.9%* (*) statistically significant Assessment: By the [facility] Criteria for diagnosis based on bone density, this patient has Low Bone Density FRAX 10-year fracture risk calculator: 4.1 % for any major fracture and 0.5 % for hip fracture. Pharmacologic therapy is recommended if 10 year fracture risk is >20% for any major osteoporotic fracture or >3% for hip fracture.  Comments: the technical quality of the study is good. WHO criteria for diagnosis of osteoporosis in postmenopausal women and in men 43 y/o or older: - normal: T-score -1.0 to + 1.0 - osteopenia/low bone density: T-score between -2.5 and -1.0 - osteoporosis: T-score below -2.5 - severe osteoporosis: T-score below -2.5 with history of fragility fracture Note: although not part of the WHO classification, the presence of a fragility fracture, regardless of the T-score, should be considered diagnostic of osteoporosis, provided other causes for the fracture have been excluded. RECOMMENDATION: 1. All patients should optimize calcium and vitamin D  intake. 2. Consider FDA-approved medical therapies in postmenopausal women and men aged 44 years and older, based on the following: a. A hip or vertebral(clinical or morphometric) fracture. b. T-Score of  -2.5 or less at the  femoral neck , total hip or spine after appropriate evaluation to exclude secondary causes c. Low bone mass (T-score between -1.0 and -2.5 at the femoral neck or spine) and a 10 year probability of a hip fracture >3% or a 10 year probability of major osteoporosis-related fracture > 20% based on the [place] -adapted WHO algorithm d. Clinical judgement and/or patient preferences may indicate treatment for people with 10-year fracture probabilities above or below  these levels Follow up BMD is recommended: 2 years Interpreted by : Stefano Redgie Butts, MD [facility]    Assessment & Plan:   Problem List Items Addressed This Visit     Hyperglycemia   Mild Monitor A1c, glucose      Pernicious anemia   F/u w/Dr Federico Chronic  On B12 inj monthly      Vitamin B 12 deficiency   On B12 inj monthly      Other Visit Diagnoses       Immunization due    -  Primary   Relevant Orders   Flu vaccine HIGH DOSE PF(Fluzone Trivalent) (Completed)         No orders of the defined types were placed in this encounter.     Follow-up: Return in about 6 months (around 06/06/2024) for a follow-up visit.  Marolyn Noel, MD

## 2023-12-08 NOTE — Assessment & Plan Note (Addendum)
 On B12 inj monthly

## 2023-12-08 NOTE — Assessment & Plan Note (Addendum)
 Mild Monitor A1c, glucose

## 2023-12-22 DIAGNOSIS — H35342 Macular cyst, hole, or pseudohole, left eye: Secondary | ICD-10-CM | POA: Diagnosis not present

## 2023-12-22 DIAGNOSIS — H35371 Puckering of macula, right eye: Secondary | ICD-10-CM | POA: Diagnosis not present

## 2023-12-22 DIAGNOSIS — H40052 Ocular hypertension, left eye: Secondary | ICD-10-CM | POA: Diagnosis not present

## 2023-12-23 ENCOUNTER — Other Ambulatory Visit: Payer: Self-pay | Admitting: Physician Assistant

## 2023-12-23 DIAGNOSIS — D510 Vitamin B12 deficiency anemia due to intrinsic factor deficiency: Secondary | ICD-10-CM

## 2023-12-24 ENCOUNTER — Inpatient Hospital Stay: Attending: Oncology

## 2023-12-24 ENCOUNTER — Inpatient Hospital Stay

## 2023-12-24 DIAGNOSIS — D510 Vitamin B12 deficiency anemia due to intrinsic factor deficiency: Secondary | ICD-10-CM | POA: Insufficient documentation

## 2023-12-24 LAB — CBC WITH DIFFERENTIAL (CANCER CENTER ONLY)
Abs Immature Granulocytes: 0.02 K/uL (ref 0.00–0.07)
Basophils Absolute: 0.1 K/uL (ref 0.0–0.1)
Basophils Relative: 1 %
Eosinophils Absolute: 0.4 K/uL (ref 0.0–0.5)
Eosinophils Relative: 4 %
HCT: 38.9 % (ref 36.0–46.0)
Hemoglobin: 12.6 g/dL (ref 12.0–15.0)
Immature Granulocytes: 0 %
Lymphocytes Relative: 18 %
Lymphs Abs: 1.5 K/uL (ref 0.7–4.0)
MCH: 26.8 pg (ref 26.0–34.0)
MCHC: 32.4 g/dL (ref 30.0–36.0)
MCV: 82.6 fL (ref 80.0–100.0)
Monocytes Absolute: 0.9 K/uL (ref 0.1–1.0)
Monocytes Relative: 11 %
Neutro Abs: 5.6 K/uL (ref 1.7–7.7)
Neutrophils Relative %: 66 %
Platelet Count: 258 K/uL (ref 150–400)
RBC: 4.71 MIL/uL (ref 3.87–5.11)
RDW: 13.2 % (ref 11.5–15.5)
WBC Count: 8.5 K/uL (ref 4.0–10.5)
nRBC: 0 % (ref 0.0–0.2)

## 2023-12-24 LAB — CMP (CANCER CENTER ONLY)
ALT: 15 U/L (ref 0–44)
AST: 19 U/L (ref 15–41)
Albumin: 4.4 g/dL (ref 3.5–5.0)
Alkaline Phosphatase: 113 U/L (ref 38–126)
Anion gap: 5 (ref 5–15)
BUN: 17 mg/dL (ref 8–23)
CO2: 27 mmol/L (ref 22–32)
Calcium: 9.9 mg/dL (ref 8.9–10.3)
Chloride: 106 mmol/L (ref 98–111)
Creatinine: 0.91 mg/dL (ref 0.44–1.00)
GFR, Estimated: >60 mL/min (ref >60)
Glucose, Bld: 72 mg/dL (ref 70–99)
Potassium: 4.3 mmol/L (ref 3.5–5.1)
Sodium: 138 mmol/L (ref 135–145)
Total Bilirubin: 0.4 mg/dL (ref 0.0–1.2)
Total Protein: 7.3 g/dL (ref 6.5–8.1)

## 2023-12-24 LAB — VITAMIN B12: Vitamin B-12: 610 pg/mL (ref 180–914)

## 2023-12-24 MED ORDER — CYANOCOBALAMIN 1000 MCG/ML IJ SOLN
1000.0000 ug | Freq: Once | INTRAMUSCULAR | Status: AC
  Administered 2023-12-24: 1000 ug via INTRAMUSCULAR
  Filled 2023-12-24: qty 1

## 2023-12-29 ENCOUNTER — Ambulatory Visit: Payer: Self-pay | Admitting: Physician Assistant

## 2023-12-29 LAB — METHYLMALONIC ACID, SERUM: Methylmalonic Acid, Quantitative: 122 nmol/L (ref 0–378)

## 2024-01-05 DIAGNOSIS — H40003 Preglaucoma, unspecified, bilateral: Secondary | ICD-10-CM | POA: Diagnosis not present

## 2024-01-05 DIAGNOSIS — H40053 Ocular hypertension, bilateral: Secondary | ICD-10-CM | POA: Diagnosis not present

## 2024-01-05 DIAGNOSIS — H35371 Puckering of macula, right eye: Secondary | ICD-10-CM | POA: Diagnosis not present

## 2024-01-23 ENCOUNTER — Other Ambulatory Visit: Payer: Self-pay | Admitting: Internal Medicine

## 2024-01-23 ENCOUNTER — Encounter: Payer: Self-pay | Admitting: Physician Assistant

## 2024-01-23 DIAGNOSIS — Z1231 Encounter for screening mammogram for malignant neoplasm of breast: Secondary | ICD-10-CM

## 2024-01-26 ENCOUNTER — Encounter: Payer: Self-pay | Admitting: Internal Medicine

## 2024-01-28 ENCOUNTER — Inpatient Hospital Stay: Attending: Oncology

## 2024-01-28 DIAGNOSIS — D510 Vitamin B12 deficiency anemia due to intrinsic factor deficiency: Secondary | ICD-10-CM

## 2024-01-28 DIAGNOSIS — E538 Deficiency of other specified B group vitamins: Secondary | ICD-10-CM | POA: Diagnosis not present

## 2024-01-28 MED ORDER — CYANOCOBALAMIN 1000 MCG/ML IJ SOLN
1000.0000 ug | Freq: Once | INTRAMUSCULAR | Status: AC
  Administered 2024-01-28: 1000 ug via INTRAMUSCULAR
  Filled 2024-01-28: qty 1

## 2024-01-29 ENCOUNTER — Ambulatory Visit (INDEPENDENT_AMBULATORY_CARE_PROVIDER_SITE_OTHER)

## 2024-01-29 ENCOUNTER — Encounter: Payer: Self-pay | Admitting: Internal Medicine

## 2024-01-29 ENCOUNTER — Ambulatory Visit: Admitting: Internal Medicine

## 2024-01-29 VITALS — BP 126/68 | HR 57 | Temp 98.2°F | Ht 66.0 in | Wt 187.8 lb

## 2024-01-29 DIAGNOSIS — M50121 Cervical disc disorder at C4-C5 level with radiculopathy: Secondary | ICD-10-CM | POA: Diagnosis not present

## 2024-01-29 DIAGNOSIS — D510 Vitamin B12 deficiency anemia due to intrinsic factor deficiency: Secondary | ICD-10-CM

## 2024-01-29 DIAGNOSIS — R202 Paresthesia of skin: Secondary | ICD-10-CM | POA: Diagnosis not present

## 2024-01-29 DIAGNOSIS — M4722 Other spondylosis with radiculopathy, cervical region: Secondary | ICD-10-CM | POA: Diagnosis not present

## 2024-01-29 DIAGNOSIS — H3322 Serous retinal detachment, left eye: Secondary | ICD-10-CM | POA: Diagnosis not present

## 2024-01-29 DIAGNOSIS — M4802 Spinal stenosis, cervical region: Secondary | ICD-10-CM | POA: Diagnosis not present

## 2024-01-29 MED ORDER — METHYLPREDNISOLONE 4 MG PO TBPK: ORAL_TABLET | ORAL | 0 refills | Status: DC

## 2024-01-29 NOTE — Assessment & Plan Note (Signed)
 L eye detached surgery 11/04/2023.

## 2024-01-29 NOTE — Progress Notes (Signed)
 Subjective:  Patient ID: Kristen Nielsen, female    DOB: 1950/11/16  Age: 73 y.o. MRN: [id number]  CC: Tingling (Tingling in left arm for 2 weeks, intermittently 1-3 times a day. Always starts at the shoulder and can sometimes radiate through to the thumb. Seems to occur post bending of elbow)   HPI [name] presents for L eye detached surgery 11/04/2023.  After surgery she was required to sit in a certain position and lay in a certain position for 3 weeks.  Now she is sleeping on her right side.  She watches TV in bed.  2 to 3 weeks ago she developed tingling in the left arm that starts in the shoulder and goes down to her thumb on the left.  There is no pain or weakness.  This may happen several times a day or night. Follow-up on permissions anemia Outpatient Medications Prior to Visit  Medication Sig Dispense Refill   acetaminophen  (TYLENOL ) 500 MG tablet Take 500 mg by mouth daily as needed for moderate pain.     amLODipine  (NORVASC ) 5 MG tablet Take 1 tablet (5 mg total) by mouth daily. 90 tablet 1   Calcium Carbonate-Vit D-Min (CALCIUM 1200 PO) Take by mouth.     Cholecalciferol (VITAMIN D ) 50 MCG (2000 UT) tablet Take 2 tablets (4,000 Units total) by mouth daily. 100 tablet 11   Turmeric 500 MG TABS Take 550 mg by mouth.     meloxicam  (MOBIC ) 15 MG tablet Take 1 tablet (15 mg total) by mouth daily. (Patient not taking: Reported on 01/29/2024) 30 tablet 5   No facility-administered medications prior to visit.    ROS: Review of Systems  Constitutional:  Negative for activity change, appetite change, chills, fatigue and unexpected weight change.  HENT:  Negative for congestion, mouth sores and sinus pressure.   Eyes:  Negative for visual disturbance.  Respiratory:  Negative for cough and chest tightness.   Gastrointestinal:  Negative for abdominal pain and nausea.  Genitourinary:  Negative for difficulty urinating, frequency and vaginal pain.  Musculoskeletal:  Negative for back  pain and gait problem.  Skin:  Negative for pallor and rash.  Neurological:  Positive for numbness. Negative for dizziness, tremors, weakness and headaches.  Psychiatric/Behavioral:  Negative for confusion and sleep disturbance.     Objective:  BP 126/68   Pulse (!) 57   Temp 98.2 F (36.8 C)   Ht 5' 6 (1.676 m)   Wt 187 lb 12.8 oz (85.2 kg)   LMP 03/25/2008   SpO2 99%   BMI 30.31 kg/m   BP Readings from Last 3 Encounters:  01/29/24 126/68  12/08/23 128/76  11/26/23 137/81    Wt Readings from Last 3 Encounters:  01/29/24 187 lb 12.8 oz (85.2 kg)  12/08/23 184 lb 6.4 oz (83.6 kg)  11/26/23 183 lb 12.8 oz (83.4 kg)    Physical Exam Constitutional:      General: She is not in acute distress.    Appearance: She is well-developed.  HENT:     Head: Normocephalic.     Right Ear: External ear normal.     Left Ear: External ear normal.     Nose: Nose normal.  Eyes:     General:        Right eye: No discharge.        Left eye: No discharge.     Conjunctiva/sclera: Conjunctivae normal.     Pupils: Pupils are equal, round, and reactive to light.  Neck:     Thyroid : No thyromegaly.     Vascular: No JVD.     Trachea: No tracheal deviation.  Cardiovascular:     Rate and Rhythm: Normal rate and regular rhythm.     Heart sounds: Normal heart sounds.  Pulmonary:     Effort: No respiratory distress.     Breath sounds: No stridor. No wheezing.  Abdominal:     General: Bowel sounds are normal. There is no distension.     Palpations: Abdomen is soft. There is no mass.     Tenderness: There is no abdominal tenderness. There is no guarding or rebound.  Musculoskeletal:        General: No tenderness.     Cervical back: Normal range of motion and neck supple. No rigidity.  Lymphadenopathy:     Cervical: No cervical adenopathy.  Skin:    Findings: No erythema or rash.  Neurological:     Mental Status: She is oriented to person, place, and time.     Cranial Nerves: No cranial  nerve deficit.     Motor: No abnormal muscle tone.     Coordination: Coordination normal.     Deep Tendon Reflexes: Reflexes normal.  Psychiatric:        Behavior: Behavior normal.        Thought Content: Thought content normal.        Judgment: Judgment normal.   Muscle strength, deep tendon reflexes in the upper extremities are normal.  Neck with full range of motion I was not able to elicit her symptoms  Lab Results  Component Value Date   WBC 8.5 12/24/2023   HGB 12.6 12/24/2023   HCT 38.9 12/24/2023   PLT 258 12/24/2023   GLUCOSE 72 12/24/2023   CHOL 182 05/25/2020   TRIG 79.0 05/25/2020   HDL 61.00 05/25/2020   LDLCALC 105 (H) 05/25/2020   ALT 15 12/24/2023   AST 19 12/24/2023   NA 138 12/24/2023   K 4.3 12/24/2023   CL 106 12/24/2023   CREATININE 0.91 12/24/2023   BUN 17 12/24/2023   CO2 27 12/24/2023   TSH 0.774 12/27/2020   INR 1.1 09/16/2018    DG Bone Density Result Date: 01/15/2023 Table formatting from the original result was not included. Date of study: 01/13/2023 Exam: DUAL X-RAY ABSORPTIOMETRY (DXA) FOR BONE MINERAL DENSITY (BMD) Instrument: [employer] Requesting Provider: PCP Indication: follow up for low BMD Comparison: 2018 Clinical data: Pt is a 73 y.o. female without previous history of fracture. Results:  Lumbar spine L1-L4 Femoral neck (FN) T-score 0.0 RFN: -1.2 LFN: -1.2 Change in BMD from previous DXA test (%) Down 8.3%* Down 7.9%* (*) statistically significant Assessment: By the [facility] Criteria for diagnosis based on bone density, this patient has Low Bone Density FRAX 10-year fracture risk calculator: 4.1 % for any major fracture and 0.5 % for hip fracture. Pharmacologic therapy is recommended if 10 year fracture risk is >20% for any major osteoporotic fracture or >3% for hip fracture.  Comments: the technical quality of the study is good. WHO criteria for diagnosis of osteoporosis in postmenopausal women and in men 78 y/o or older: - normal:  T-score -1.0 to + 1.0 - osteopenia/low bone density: T-score between -2.5 and -1.0 - osteoporosis: T-score below -2.5 - severe osteoporosis: T-score below -2.5 with history of fragility fracture Note: although not part of the WHO classification, the presence of a fragility fracture, regardless of the T-score, should be considered diagnostic of osteoporosis,  provided other causes for the fracture have been excluded. RECOMMENDATION: 1. All patients should optimize calcium and vitamin D  intake. 2. Consider FDA-approved medical therapies in postmenopausal women and men aged 24 years and older, based on the following: a. A hip or vertebral(clinical or morphometric) fracture. b. T-Score of  -2.5 or less at the femoral neck , total hip or spine after appropriate evaluation to exclude secondary causes c. Low bone mass (T-score between -1.0 and -2.5 at the femoral neck or spine) and a 10 year probability of a hip fracture >3% or a 10 year probability of major osteoporosis-related fracture > 20% based on the [place] -adapted WHO algorithm d. Clinical judgement and/or patient preferences may indicate treatment for people with 10-year fracture probabilities above or below these levels Follow up BMD is recommended: 2 years Interpreted by : Stefano Redgie Butts, MD [facility]    Assessment & Plan:   Problem List Items Addressed This Visit     Detached retina, left    L eye detached surgery 11/04/2023.      Paresthesia of left arm - Primary   New. ?C5-6 nerve distribution L radiculopathy Will obtain x ray C spine Medrol pack prescribed.  Risks and benefits discussed Prop well w/pillows        Relevant Orders   DG Cervical Spine Complete (Completed)   Pernicious anemia   Continue with B12 injections         Meds ordered this encounter  Medications   methylPREDNISolone (MEDROL DOSEPAK) 4 MG TBPK tablet    Sig: As directed    Dispense:  21 tablet    Refill:  0      Follow-up: Return in  about 4 weeks (around 02/26/2024) for a follow-up visit.  Marolyn Noel, MD

## 2024-01-29 NOTE — Assessment & Plan Note (Addendum)
 New. ?C5-6 nerve distribution L radiculopathy Will obtain x ray C spine Medrol pack prescribed.  Risks and benefits discussed Prop well w/pillows

## 2024-01-29 NOTE — Assessment & Plan Note (Signed)
Continue with B12 injections?

## 2024-01-30 ENCOUNTER — Ambulatory Visit: Payer: Self-pay | Admitting: Internal Medicine

## 2024-02-05 DIAGNOSIS — I10 Essential (primary) hypertension: Secondary | ICD-10-CM | POA: Diagnosis not present

## 2024-02-05 DIAGNOSIS — M8580 Other specified disorders of bone density and structure, unspecified site: Secondary | ICD-10-CM | POA: Diagnosis not present

## 2024-02-05 DIAGNOSIS — Z8249 Family history of ischemic heart disease and other diseases of the circulatory system: Secondary | ICD-10-CM | POA: Diagnosis not present

## 2024-02-05 DIAGNOSIS — Z809 Family history of malignant neoplasm, unspecified: Secondary | ICD-10-CM | POA: Diagnosis not present

## 2024-02-10 ENCOUNTER — Encounter: Payer: Self-pay | Admitting: Internal Medicine

## 2024-02-13 ENCOUNTER — Ambulatory Visit

## 2024-02-15 ENCOUNTER — Other Ambulatory Visit: Payer: Self-pay | Admitting: Internal Medicine

## 2024-02-15 MED ORDER — METHYLPREDNISOLONE 4 MG PO TBPK: ORAL_TABLET | ORAL | 0 refills | Status: DC

## 2024-02-21 ENCOUNTER — Other Ambulatory Visit: Payer: Self-pay | Admitting: Hematology and Oncology

## 2024-02-22 ENCOUNTER — Encounter: Payer: Self-pay | Admitting: Oncology

## 2024-02-25 ENCOUNTER — Inpatient Hospital Stay: Attending: Oncology | Admitting: Hematology and Oncology

## 2024-02-25 ENCOUNTER — Inpatient Hospital Stay

## 2024-02-25 ENCOUNTER — Other Ambulatory Visit: Payer: Self-pay | Admitting: Hematology and Oncology

## 2024-02-25 VITALS — BP 135/68 | HR 62 | Temp 97.7°F | Resp 13 | Wt 186.6 lb

## 2024-02-25 DIAGNOSIS — E538 Deficiency of other specified B group vitamins: Secondary | ICD-10-CM | POA: Insufficient documentation

## 2024-02-25 DIAGNOSIS — D510 Vitamin B12 deficiency anemia due to intrinsic factor deficiency: Secondary | ICD-10-CM

## 2024-02-25 LAB — CMP (CANCER CENTER ONLY)
ALT: 18 U/L (ref 0–44)
AST: 22 U/L (ref 15–41)
Albumin: 4.3 g/dL (ref 3.5–5.0)
Alkaline Phosphatase: 111 U/L (ref 38–126)
Anion gap: 9 (ref 5–15)
BUN: 16 mg/dL (ref 8–23)
CO2: 26 mmol/L (ref 22–32)
Calcium: 9.5 mg/dL (ref 8.9–10.3)
Chloride: 106 mmol/L (ref 98–111)
Creatinine: 0.89 mg/dL (ref 0.44–1.00)
GFR, Estimated: >60 mL/min (ref >60)
Glucose, Bld: 100 mg/dL — ABNORMAL HIGH (ref 70–99)
Potassium: 4.1 mmol/L (ref 3.5–5.1)
Sodium: 140 mmol/L (ref 135–145)
Total Bilirubin: 0.4 mg/dL (ref 0.0–1.2)
Total Protein: 7.1 g/dL (ref 6.5–8.1)

## 2024-02-25 LAB — CBC WITH DIFFERENTIAL (CANCER CENTER ONLY)
Abs Immature Granulocytes: 0.05 K/uL (ref 0.00–0.07)
Basophils Absolute: 0.1 K/uL (ref 0.0–0.1)
Basophils Relative: 1 %
Eosinophils Absolute: 0.2 K/uL (ref 0.0–0.5)
Eosinophils Relative: 3 %
HCT: 38.5 % (ref 36.0–46.0)
Hemoglobin: 12.5 g/dL (ref 12.0–15.0)
Immature Granulocytes: 1 %
Lymphocytes Relative: 16 %
Lymphs Abs: 1.1 K/uL (ref 0.7–4.0)
MCH: 27.3 pg (ref 26.0–34.0)
MCHC: 32.5 g/dL (ref 30.0–36.0)
MCV: 84.1 fL (ref 80.0–100.0)
Monocytes Absolute: 0.6 K/uL (ref 0.1–1.0)
Monocytes Relative: 9 %
Neutro Abs: 4.8 K/uL (ref 1.7–7.7)
Neutrophils Relative %: 70 %
Platelet Count: 278 K/uL (ref 150–400)
RBC: 4.58 MIL/uL (ref 3.87–5.11)
RDW: 13.3 % (ref 11.5–15.5)
WBC Count: 6.7 K/uL (ref 4.0–10.5)
nRBC: 0 % (ref 0.0–0.2)

## 2024-02-25 LAB — VITAMIN B12: Vitamin B-12: 637 pg/mL (ref 180–914)

## 2024-02-25 MED ORDER — CYANOCOBALAMIN 1000 MCG/ML IJ SOLN
1000.0000 ug | Freq: Once | INTRAMUSCULAR | Status: AC
  Administered 2024-02-25: 1000 ug via INTRAMUSCULAR
  Filled 2024-02-25: qty 1

## 2024-02-25 NOTE — Progress Notes (Signed)
 [facility] [phone]   [phone]  PROGRESS NOTE  Patient Care Team: Plotnikov, Karlynn GAILS, MD as PCP - General (Internal Medicine) Rodgers Barnie RAMAN, CNM as Referring Physician (Certified Nurse Midwife) Donnald Charleston, MD as Consulting Physician (Gastroenterology) Yvone Rush, MD as Consulting Physician (Orthopedic Surgery) [facility] as Consulting Physician (Ophthalmology) Federico Rush [name], MD as Consulting Physician (Hematology and Oncology) Tobie Bamberger, OD as Referring Physician (Optometry)  Hematological/Oncological History # Vitamin B12 Deficiency  12/27/2020: last visit with Dr. Magrinat  01/09/2022: Transition care to Dr. Federico   Interval History:  Kristen Nielsen 73 y.o. female with medical history significant for vitamin b12 deficiency who presents for a follow up visit. The patient's last visit was on 11/26/2023. In the interim since the last visit she has continued on q 4 week IM B12 injections.   On exam today [name] reports she has been well overall in the interim since our last visit and is tolerating her B12 injections fine.  She is not having any redness, itching, rash at the site of injection.  She reports her energy levels are good.  She notes that her energy today is about an 8 out of 10.  She reports that it can sometimes be as good as 10 out of 10.  She reports he is not having any overt signs of bleeding, bruising, or dark stools.  She reports that she has had no trouble with fevers, chills, sweats, runny nose, sore throat, cough.  Her appetite remains strong and she does her best to try to eat vitamin B12 rich foods.  She has had no recent hospitalizations, ER visits, or medications but did recently undergo her eye surgery which went well.  She reports that she recently received a pulse of steroids for her neuropathy and might be getting a second time dose regimen of this.  Otherwise she feels well and is  willing and able to continue with vitamin B12 injections at this time.  MEDICAL HISTORY:  Past Medical History:  Diagnosis Date   Anemia    Arthritis    Environmental allergies    Vitamin B 12 deficiency     SURGICAL HISTORY: Past Surgical History:  Procedure Laterality Date   TONSILLECTOMY AND ADENOIDECTOMY     TOTAL KNEE ARTHROPLASTY Left 09/18/2018   Procedure: TOTAL KNEE ARTHROPLASTY;  Surgeon: Yvone Rush, MD;  Location: [facility];  Service: Orthopedics;  Laterality: Left;    SOCIAL HISTORY: Social History   Socioeconomic History   Marital status: Divorced    Spouse name: Not on file   Number of children: Not on file   Years of education: Not on file   Highest education level: Not on file  Occupational History   Not on file  Tobacco Use   Smoking status: Never   Smokeless tobacco: Never  Substance and Sexual Activity   Alcohol use: Not Currently    Alcohol/week: 1.0 standard drink of alcohol    Types: 1 Shots of liquor per week    Comment: occ   Drug use: No   Sexual activity: Not Currently    Partners: Male    Birth control/protection: Post-menopausal  Other Topics Concern   Not on file  Social History Narrative   Not on file   Social Drivers of Health   Financial Resource Strain: Low Risk  (11/18/2023)   Overall Financial Resource Strain (CARDIA)    Difficulty of Paying Living Expenses: Not hard at all  Food Insecurity: No Food Insecurity (11/18/2023)   Hunger Vital Sign    Worried About Running Out of Food in the Last Year: Never true    Ran Out of Food in the Last Year: Never true  Transportation Needs: No Transportation Needs (11/18/2023)   PRAPARE - Administrator, Civil Service (Medical): No    Lack of Transportation (Non-Medical): No  Physical Activity: Insufficiently Active (11/18/2023)   Exercise Vital Sign    Days of Exercise per Week: 5 days    Minutes of Exercise per Session: 20 min  Stress: No Stress Concern Present (11/18/2023)    [employer] of Occupational Health - Occupational Stress Questionnaire    Feeling of Stress: Not at all  Social Connections: Moderately Integrated (11/18/2023)   Social Connection and Isolation Panel    Frequency of Communication with Friends and Family: More than three times a week    Frequency of Social Gatherings with Friends and Family: Three times a week    Attends Religious Services: More than 4 times per year    Active Member of Clubs or Organizations: Yes    Attends Banker Meetings: More than 4 times per year    Marital Status: Divorced  Intimate Partner Violence: Not At Risk (11/18/2023)   Humiliation, Afraid, Rape, and Kick questionnaire    Fear of Current or Ex-Partner: No    Emotionally Abused: No    Physically Abused: No    Sexually Abused: No    FAMILY HISTORY: Family History  Problem Relation Age of Onset   Hypertension Mother    Cancer Father        lung ca   Hypertension Father    Diabetes Father    Thyroid  disease Father    Breast cancer Sister 26   Cancer Brother 51       col ca    ALLERGIES:  has no known allergies.  MEDICATIONS:  Current Outpatient Medications  Medication Sig Dispense Refill   acetaminophen  (TYLENOL ) 500 MG tablet Take 500 mg by mouth daily as needed for moderate pain.     amLODipine  (NORVASC ) 5 MG tablet TAKE 1 TABLET (5 MG TOTAL) BY MOUTH DAILY. 90 tablet 1   Calcium Carbonate-Vit D-Min (CALCIUM 1200 PO) Take by mouth.     Cholecalciferol (VITAMIN D ) 50 MCG (2000 UT) tablet Take 2 tablets (4,000 Units total) by mouth daily. 100 tablet 11   meloxicam  (MOBIC ) 15 MG tablet Take 1 tablet (15 mg total) by mouth daily. (Patient not taking: Reported on 01/29/2024) 30 tablet 5   methylPREDNISolone  (MEDROL  DOSEPAK) 4 MG TBPK tablet As directed 21 tablet 0   Turmeric 500 MG TABS Take 550 mg by mouth.     No current facility-administered medications for this visit.    REVIEW OF SYSTEMS:   Constitutional: ( - ) fevers, ( -  )  chills , ( - ) night sweats Eyes: ( - ) blurriness of vision, ( - ) double vision, ( - ) watery eyes Ears, nose, mouth, throat, and face: ( - ) mucositis, ( - ) sore throat Respiratory: ( - ) cough, ( - ) dyspnea, ( - ) wheezes Cardiovascular: ( - ) palpitation, ( - ) chest discomfort, ( - ) lower extremity swelling Gastrointestinal:  ( - ) nausea, ( - ) heartburn, ( - ) change in bowel habits Skin: ( - ) abnormal skin rashes Lymphatics: ( - ) new lymphadenopathy, ( - ) easy bruising Neurological: ( - )  numbness, ( - ) tingling, ( - ) new weaknesses Behavioral/Psych: ( - ) mood change, ( - ) new changes  All other systems were reviewed with the patient and are negative.  PHYSICAL EXAMINATION:  There were no vitals filed for this visit.     There were no vitals filed for this visit.      GENERAL: Well-appearing elderly African-American female, alert, no distress and comfortable SKIN: skin color, texture, turgor are normal, no rashes or significant lesions EYES: conjunctiva are pink and non-injected, sclera clear LUNGS: clear to auscultation and percussion with normal breathing effort HEART: bradycardic with regular rhythm and no murmurs and no lower extremity edema Musculoskeletal: no cyanosis of digits and no clubbing  PSYCH: alert & oriented x 3, fluent speech NEURO: no focal motor/sensory deficits  LABORATORY DATA:  I have reviewed the data as listed    Latest Ref Rng & Units 12/24/2023    1:10 PM 11/26/2023   10:28 AM 08/27/2023   10:53 AM  CBC  WBC 4.0 - 10.5 K/uL 8.5  7.7  7.3   Hemoglobin 12.0 - 15.0 g/dL 87.3  88.0  87.4   Hematocrit 36.0 - 46.0 % 38.9  36.2  38.9   Platelets 150 - 400 K/uL 258  235  287        Latest Ref Rng & Units 12/24/2023    1:10 PM 11/26/2023   10:28 AM 08/27/2023   10:53 AM  CMP  Glucose 70 - 99 mg/dL 72  98  99   BUN 8 - 23 mg/dL 17  13  19    Creatinine 0.44 - 1.00 mg/dL 9.08  9.27  9.15   Sodium 135 - 145 mmol/L 138  141  139    Potassium 3.5 - 5.1 mmol/L 4.3  4.1  4.2   Chloride 98 - 111 mmol/L 106  107  108   CO2 22 - 32 mmol/L 27  29  25    Calcium 8.9 - 10.3 mg/dL 9.9  9.8  9.7   Total Protein 6.5 - 8.1 g/dL 7.3  7.2  7.3   Total Bilirubin 0.0 - 1.2 mg/dL 0.4  0.5  0.5   Alkaline Phos 38 - 126 U/L 113  104  104   AST 15 - 41 U/L 19  17  19    ALT 0 - 44 U/L 15  14  15      RADIOGRAPHIC STUDIES: DG Cervical Spine Complete Result Date: 01/29/2024 EXAM: 6 OR MORE VIEW(S) XRAY OF THE CERVICAL SPINE 01/29/2024 03:34:23 PM COMPARISON: None available. CLINICAL HISTORY: Cervical radiculopathy L C5-6; numbness - L arm. Thx Cervical radiculopathy L C5-6; numbness - L arm. Thx FINDINGS: BONES: No acute fracture. No aggressive appearing osseous lesion. Alignment is normal. DISCS AND DEGENERATIVE CHANGES: Mild degenerative disc disease is noted at C4-C5 and C5-C6. Mild bilateral neural foraminal stenosis is noted at these levels secondary to uncovertebral spurring. SOFT TISSUES: No prevertebral soft tissue swelling. Carotid artery calcifications are noted bilaterally. The visualized lungs appear clear. IMPRESSION: 1. Mild degenerative disc disease at C4-5 and C5-6 with mild bilateral neural foraminal stenosis secondary to uncovertebral spurring. 2. Bilateral carotid artery calcifications. Electronically signed by: Lynwood Seip MD 01/29/2024 03:45 PM EST RP Workstation: [id number]    ASSESSMENT & PLAN Kristen LULLA Nielsen 73 y.o. female with medical history significant for vitamin b12 deficiency who presents for a follow up visit.  # Vitamin B12 Deficiency  -- Etiology is thought to be pernicious anemia diagnosed in 1996  with a positive intrinsic factor antibody. -- Labs today show WBC 6.7, Hgb 12.5, MCV 84.1, Plt 278, creatinine and LFTs normal. Vitamin B12 level 610 at last check.  -- Continue vitamin B12 injections 1000 mcg but patient is requesting to increase frequency to every 4 weeks instead which is appropriate since vitamin B12  levels are low end of normal.  --RTC for monthly B12 injection and follow up visit in 6 months.   #Bradycardia: --EKG showed sinus bradycardia similar to EKG from June 2020.  --Patient adds that her mother and sister both have lower HR at baseline --Advised to monitor for any chest discomfort, shortness of breath or worsening fatigue. If there is any new symptom, she will follow up with PCP to further evaluate.   No orders of the defined types were placed in this encounter.   All questions were answered. The patient knows to call the clinic with any problems, questions or concerns.  A total of more than 25 minutes were spent on this encounter with face-to-face time and non-face-to-face time, including preparing to see the patient, ordering tests and/or medications, counseling the patient and coordination of care as outlined above.   Norleen IVAR Kidney, MD Department of Hematology/Oncology [facility] Phone: [phone] Pager: [phone] Email: [email or web address]    02/25/2024 7:35 AM

## 2024-02-28 LAB — METHYLMALONIC ACID, SERUM: Methylmalonic Acid, Quantitative: 113 nmol/L (ref 0–378)

## 2024-03-04 ENCOUNTER — Inpatient Hospital Stay: Admission: RE | Admit: 2024-03-04 | Source: Ambulatory Visit

## 2024-03-10 ENCOUNTER — Encounter: Payer: Self-pay | Admitting: Internal Medicine

## 2024-03-10 ENCOUNTER — Ambulatory Visit: Admitting: Internal Medicine

## 2024-03-10 VITALS — BP 130/70 | HR 64 | Temp 98.9°F | Ht 66.0 in | Wt 192.4 lb

## 2024-03-10 DIAGNOSIS — H3322 Serous retinal detachment, left eye: Secondary | ICD-10-CM | POA: Diagnosis not present

## 2024-03-10 DIAGNOSIS — E538 Deficiency of other specified B group vitamins: Secondary | ICD-10-CM

## 2024-03-10 DIAGNOSIS — R202 Paresthesia of skin: Secondary | ICD-10-CM | POA: Diagnosis not present

## 2024-03-10 DIAGNOSIS — D510 Vitamin B12 deficiency anemia due to intrinsic factor deficiency: Secondary | ICD-10-CM

## 2024-03-10 NOTE — Assessment & Plan Note (Signed)
Continue with B12 injections?

## 2024-03-10 NOTE — Progress Notes (Signed)
 Subjective:  Patient ID: Kristen Nielsen, female    DOB: Apr 10, 1950  Age: 73 y.o. MRN: [id number]  CC: Follow-up   HPI [name] presents for L arm pain - 65% better after 2 courses of steroids  Outpatient Medications Prior to Visit  Medication Sig Dispense Refill   acetaminophen  (TYLENOL ) 500 MG tablet Take 500 mg by mouth daily as needed for moderate pain.     amLODipine  (NORVASC ) 5 MG tablet TAKE 1 TABLET (5 MG TOTAL) BY MOUTH DAILY. 90 tablet 1   Calcium Carbonate-Vit D-Min (CALCIUM 1200 PO) Take by mouth.     Cholecalciferol (VITAMIN D ) 50 MCG (2000 UT) tablet Take 2 tablets (4,000 Units total) by mouth daily. 100 tablet 11   Turmeric 500 MG TABS Take 550 mg by mouth.     meloxicam  (MOBIC ) 15 MG tablet Take 1 tablet (15 mg total) by mouth daily. (Patient not taking: Reported on 03/10/2024) 30 tablet 5   methylPREDNISolone  (MEDROL  DOSEPAK) 4 MG TBPK tablet As directed (Patient not taking: Reported on 03/10/2024) 21 tablet 0   No facility-administered medications prior to visit.    ROS: Review of Systems  Constitutional:  Negative for activity change, appetite change, chills, fatigue and unexpected weight change.  HENT:  Negative for congestion, mouth sores and sinus pressure.   Eyes:  Negative for visual disturbance.  Respiratory:  Negative for cough and chest tightness.   Gastrointestinal:  Negative for abdominal pain and nausea.  Genitourinary:  Negative for difficulty urinating, frequency and vaginal pain.  Musculoskeletal:  Positive for arthralgias. Negative for back pain and gait problem.  Skin:  Negative for pallor and rash.  Neurological:  Positive for numbness. Negative for dizziness, tremors, weakness and headaches.  Psychiatric/Behavioral:  Negative for confusion, sleep disturbance and suicidal ideas.     Objective:  BP 130/70   Pulse 64   Temp 98.9 F (37.2 C) (Oral)   Ht 5' 6 (1.676 m)   Wt 192 lb 6.4 oz (87.3 kg)   LMP 03/25/2008   SpO2 98%   BMI  31.05 kg/m   BP Readings from Last 3 Encounters:  03/10/24 130/70  02/25/24 135/68  01/29/24 126/68    Wt Readings from Last 3 Encounters:  03/10/24 192 lb 6.4 oz (87.3 kg)  02/25/24 186 lb 9.6 oz (84.6 kg)  01/29/24 187 lb 12.8 oz (85.2 kg)    Physical Exam Constitutional:      General: She is not in acute distress.    Appearance: She is well-developed.  HENT:     Head: Normocephalic.     Right Ear: External ear normal.     Left Ear: External ear normal.     Nose: Nose normal.  Eyes:     General:        Right eye: No discharge.        Left eye: No discharge.     Conjunctiva/sclera: Conjunctivae normal.     Pupils: Pupils are equal, round, and reactive to light.  Neck:     Thyroid : No thyromegaly.     Vascular: No JVD.     Trachea: No tracheal deviation.  Cardiovascular:     Rate and Rhythm: Normal rate and regular rhythm.     Heart sounds: Normal heart sounds.  Pulmonary:     Effort: No respiratory distress.     Breath sounds: No stridor. No wheezing.  Abdominal:     General: Bowel sounds are normal. There is no distension.  Palpations: Abdomen is soft. There is no mass.     Tenderness: There is no abdominal tenderness. There is no right CVA tenderness, guarding or rebound.  Musculoskeletal:        General: No tenderness.     Cervical back: Normal range of motion and neck supple. No rigidity.     Right lower leg: No edema.     Left lower leg: No edema.  Lymphadenopathy:     Cervical: No cervical adenopathy.  Skin:    Findings: No erythema or rash.  Neurological:     Cranial Nerves: No cranial nerve deficit.     Motor: No abnormal muscle tone.     Coordination: Coordination normal.     Deep Tendon Reflexes: Reflexes normal.  Psychiatric:        Behavior: Behavior normal.        Thought Content: Thought content normal.        Judgment: Judgment normal.   L upper arm is sensitive to palpation  Lab Results  Component Value Date   WBC 6.7 02/25/2024    HGB 12.5 02/25/2024   HCT 38.5 02/25/2024   PLT 278 02/25/2024   GLUCOSE 100 (H) 02/25/2024   CHOL 182 05/25/2020   TRIG 79.0 05/25/2020   HDL 61.00 05/25/2020   LDLCALC 105 (H) 05/25/2020   ALT 18 02/25/2024   AST 22 02/25/2024   NA 140 02/25/2024   K 4.1 02/25/2024   CL 106 02/25/2024   CREATININE 0.89 02/25/2024   BUN 16 02/25/2024   CO2 26 02/25/2024   TSH 0.774 12/27/2020   INR 1.1 09/16/2018    DG Bone Density Result Date: 01/15/2023 Table formatting from the original result was not included. Date of study: 01/13/2023 Exam: DUAL X-RAY ABSORPTIOMETRY (DXA) FOR BONE MINERAL DENSITY (BMD) Instrument: [employer] Requesting Provider: PCP Indication: follow up for low BMD Comparison: 2018 Clinical data: Pt is a 73 y.o. female without previous history of fracture. Results:  Lumbar spine L1-L4 Femoral neck (FN) T-score 0.0 RFN: -1.2 LFN: -1.2 Change in BMD from previous DXA test (%) Down 8.3%* Down 7.9%* (*) statistically significant Assessment: By the [facility] Criteria for diagnosis based on bone density, this patient has Low Bone Density FRAX 10-year fracture risk calculator: 4.1 % for any major fracture and 0.5 % for hip fracture. Pharmacologic therapy is recommended if 10 year fracture risk is >20% for any major osteoporotic fracture or >3% for hip fracture.  Comments: the technical quality of the study is good. WHO criteria for diagnosis of osteoporosis in postmenopausal women and in men 70 y/o or older: - normal: T-score -1.0 to + 1.0 - osteopenia/low bone density: T-score between -2.5 and -1.0 - osteoporosis: T-score below -2.5 - severe osteoporosis: T-score below -2.5 with history of fragility fracture Note: although not part of the WHO classification, the presence of a fragility fracture, regardless of the T-score, should be considered diagnostic of osteoporosis, provided other causes for the fracture have been excluded. RECOMMENDATION: 1. All patients should optimize calcium and  vitamin D  intake. 2. Consider FDA-approved medical therapies in postmenopausal women and men aged 90 years and older, based on the following: a. A hip or vertebral(clinical or morphometric) fracture. b. T-Score of  -2.5 or less at the femoral neck , total hip or spine after appropriate evaluation to exclude secondary causes c. Low bone mass (T-score between -1.0 and -2.5 at the femoral neck or spine) and a 10 year probability of a hip fracture >3% or  a 10 year probability of major osteoporosis-related fracture > 20% based on the [place] -adapted WHO algorithm d. Clinical judgement and/or patient preferences may indicate treatment for people with 10-year fracture probabilities above or below these levels Follow up BMD is recommended: 2 years Interpreted by : Stefano Redgie Butts, MD [facility]    Assessment & Plan:   Problem List Items Addressed This Visit     Detached retina, left    L eye detached surgery 11/04/2023. Better      Paresthesia of left arm - Primary   Persistent  ?C5-6 nerve distribution L radiculopathy Will get an MRI - C spine      Relevant Orders   MR Cervical Spine Wo Contrast   Pernicious anemia   Continue with B12 injections      Vitamin B 12 deficiency   On B12 inj monthly         No orders of the defined types were placed in this encounter.     Follow-up: Return in about 6 weeks (around 04/21/2024) for a follow-up visit.  Marolyn Noel, MD

## 2024-03-10 NOTE — Assessment & Plan Note (Signed)
 L eye detached surgery 11/04/2023. Better

## 2024-03-10 NOTE — Assessment & Plan Note (Signed)
 On B12 inj monthly

## 2024-03-10 NOTE — Assessment & Plan Note (Signed)
 Persistent  ?C5-6 nerve distribution L radiculopathy Will get an MRI - C spine

## 2024-03-13 ENCOUNTER — Ambulatory Visit: Payer: Self-pay | Admitting: Internal Medicine

## 2024-03-13 ENCOUNTER — Other Ambulatory Visit

## 2024-03-13 DIAGNOSIS — R202 Paresthesia of skin: Secondary | ICD-10-CM

## 2024-03-24 ENCOUNTER — Inpatient Hospital Stay

## 2024-03-24 DIAGNOSIS — D510 Vitamin B12 deficiency anemia due to intrinsic factor deficiency: Secondary | ICD-10-CM

## 2024-03-24 DIAGNOSIS — E538 Deficiency of other specified B group vitamins: Secondary | ICD-10-CM | POA: Diagnosis not present

## 2024-03-24 MED ORDER — CYANOCOBALAMIN 1000 MCG/ML IJ SOLN
1000.0000 ug | Freq: Once | INTRAMUSCULAR | Status: AC
  Administered 2024-03-24: 1000 ug via INTRAMUSCULAR
  Filled 2024-03-24: qty 1

## 2024-04-07 ENCOUNTER — Encounter: Payer: Self-pay | Admitting: Oncology

## 2024-04-07 ENCOUNTER — Ambulatory Visit
Admission: RE | Admit: 2024-04-07 | Discharge: 2024-04-07 | Disposition: A | Discharge status: Home / Self Care | Source: Ambulatory Visit | Attending: Internal Medicine | Admitting: Internal Medicine

## 2024-04-07 DIAGNOSIS — Z1231 Encounter for screening mammogram for malignant neoplasm of breast: Secondary | ICD-10-CM

## 2024-04-08 ENCOUNTER — Ambulatory Visit: Admitting: Internal Medicine

## 2024-04-21 ENCOUNTER — Inpatient Hospital Stay: Attending: Oncology

## 2024-04-22 ENCOUNTER — Ambulatory Visit: Admitting: Internal Medicine

## 2024-04-22 ENCOUNTER — Encounter: Payer: Self-pay | Admitting: Internal Medicine

## 2024-04-22 ENCOUNTER — Telehealth: Payer: Self-pay | Admitting: Hematology and Oncology

## 2024-04-22 VITALS — BP 118/70 | HR 68 | Temp 97.9°F | Ht 66.0 in | Wt 180.0 lb

## 2024-04-22 DIAGNOSIS — R202 Paresthesia of skin: Secondary | ICD-10-CM

## 2024-04-22 DIAGNOSIS — B029 Zoster without complications: Secondary | ICD-10-CM | POA: Insufficient documentation

## 2024-04-22 MED ORDER — TRAMADOL HCL 50 MG PO TABS: 50.0000 mg | ORAL_TABLET | Freq: Four times a day (QID) | ORAL | 1 refills | Status: AC | PRN

## 2024-04-22 MED ORDER — VALACYCLOVIR HCL 1 G PO TABS: 1000.0000 mg | ORAL_TABLET | Freq: Three times a day (TID) | ORAL | 0 refills | Status: AC

## 2024-04-22 MED ORDER — MELOXICAM 15 MG PO TABS: 15.0000 mg | ORAL_TABLET | Freq: Every day | ORAL | 1 refills | Status: AC

## 2024-04-22 NOTE — Assessment & Plan Note (Signed)
 C spine MRI IMPRESSION: 1. Moderate central spinal canal stenosis and moderate-to-severe bilateral neuroforaminal stenosis at C4-5 and C5-6, with likely impingement of the C5 nerves bilaterally at C4-5 and suspected impingement of the C6 nerves bilaterally at C5-6. 2. Mild-to-moderate central spinal canal stenosis at C6-7 due to central disc protrusion with craniad extrusion. Neural foramina are patent at this level.   Electronically signed by: Evalene Coho MD 03/13/2024 08:12 AM EST RP  Discussed findings. Doing better NS ref was offered

## 2024-04-22 NOTE — Assessment & Plan Note (Addendum)
 R chest rash in a vaccinated person Valtrex  po x 7 d Tramadol  prn Blue Emu

## 2024-04-22 NOTE — Progress Notes (Unsigned)
 "  Subjective:  Patient ID: Kristen Nielsen, female    DOB: 10-28-1950  Age: 74 y.o. MRN: [id number]  CC: Follow-up (Follow up for pinched nerve in left arm and having new pain in middle of back. Sarted about seven ago.)   HPI Kristen Nielsen presents for  R CP x 1 wk, rash F/u w/cervical radiculopathy  Outpatient Medications Prior to Visit  Medication Sig Dispense Refill   acetaminophen  (TYLENOL ) 500 MG tablet Take 500 mg by mouth daily as needed for moderate pain.     amLODipine  (NORVASC ) 5 MG tablet TAKE 1 TABLET (5 MG TOTAL) BY MOUTH DAILY. 90 tablet 1   Calcium Carbonate-Vit D-Min (CALCIUM 1200 PO) Take by mouth.     Cholecalciferol (VITAMIN D ) 50 MCG (2000 UT) tablet Take 2 tablets (4,000 Units total) by mouth daily. 100 tablet 11   Turmeric 500 MG TABS Take 550 mg by mouth.     meloxicam  (MOBIC ) 15 MG tablet Take 1 tablet (15 mg total) by mouth daily. 30 tablet 5   methylPREDNISolone  (MEDROL  DOSEPAK) 4 MG TBPK tablet As directed 21 tablet 0   No facility-administered medications prior to visit.    ROS: Review of Systems  Objective:  BP 118/70   Pulse 68   Temp 97.9 F (36.6 C) (Oral)   Ht 5' 6 (1.676 m)   Wt 180 lb (81.6 kg)   LMP 03/25/2008   SpO2 97%   BMI 29.05 kg/m   BP Readings from Last 3 Encounters:  04/22/24 118/70  03/10/24 130/70  02/25/24 135/68    Wt Readings from Last 3 Encounters:  04/22/24 180 lb (81.6 kg)  03/10/24 192 lb 6.4 oz (87.3 kg)  02/25/24 186 lb 9.6 oz (84.6 kg)    Physical Exam R ribs w/shingles rash Lab Results  Component Value Date   WBC 6.7 02/25/2024   HGB 12.5 02/25/2024   HCT 38.5 02/25/2024   PLT 278 02/25/2024   GLUCOSE 100 (H) 02/25/2024   CHOL 182 05/25/2020   TRIG 79.0 05/25/2020   HDL 61.00 05/25/2020   LDLCALC 105 (H) 05/25/2020   ALT 18 02/25/2024   AST 22 02/25/2024   NA 140 02/25/2024   K 4.1 02/25/2024   CL 106 02/25/2024   CREATININE 0.89 02/25/2024   BUN 16 02/25/2024   CO2 26 02/25/2024   TSH  0.774 12/27/2020   INR 1.1 09/16/2018    MM 3D SCREENING MAMMOGRAM BILATERAL BREAST Result Date: 04/09/2024 CLINICAL DATA:  Screening. EXAM: DIGITAL SCREENING BILATERAL MAMMOGRAM WITH TOMOSYNTHESIS AND CAD TECHNIQUE: Bilateral screening digital craniocaudal and mediolateral oblique mammograms were obtained. Bilateral screening digital breast tomosynthesis was performed. The images were evaluated with computer-aided detection. COMPARISON:  Previous exam(s). ACR Breast Density Category b: There are scattered areas of fibroglandular density. FINDINGS: There are no findings suspicious for malignancy. IMPRESSION: No mammographic evidence of malignancy. A result letter of this screening mammogram will be mailed directly to the patient. RECOMMENDATION: Screening mammogram in one year. (Code:SM-B-01Y) BI-RADS CATEGORY  1: Negative. Electronically Signed   By: Dina  Arceo M.D.   On: 04/09/2024 09:39    Assessment & Plan:   Problem List Items Addressed This Visit     Paresthesia of left arm - Primary   C spine MRI IMPRESSION: 1. Moderate central spinal canal stenosis and moderate-to-severe bilateral neuroforaminal stenosis at C4-5 and C5-6, with likely impingement of the C5 nerves bilaterally at C4-5 and suspected impingement of the C6 nerves bilaterally at C5-6. 2. Mild-to-moderate  central spinal canal stenosis at C6-7 due to central disc protrusion with craniad extrusion. Neural foramina are patent at this level.   Electronically signed by: Evalene Coho MD 03/13/2024 08:12 AM EST RP  Discussed findings. Doing better NS ref was offered      Shingles outbreak   R chest rash in a vaccinated person Valtrex  po x 7 d Tramadol  prn Blue Emu      Relevant Medications   valACYclovir  (VALTREX ) 1000 MG tablet      Meds ordered this encounter  Medications   valACYclovir  (VALTREX ) 1000 MG tablet    Sig: Take 1 tablet (1,000 mg total) by mouth 3 (three) times daily.    Dispense:  21 tablet     Refill:  0   meloxicam  (MOBIC ) 15 MG tablet    Sig: Take 1 tablet (15 mg total) by mouth daily.    Dispense:  30 tablet    Refill:  1   traMADol  (ULTRAM ) 50 MG tablet    Sig: Take 1 tablet (50 mg total) by mouth every 6 (six) hours as needed.    Dispense:  20 tablet    Refill:  1      Follow-up: Return in about 6 months (around 10/20/2024) for a follow-up visit.  Marolyn Noel, MD "

## 2024-04-22 NOTE — Telephone Encounter (Signed)
 Called pt to reschedule injection for later date

## 2024-04-30 ENCOUNTER — Inpatient Hospital Stay: Attending: Oncology

## 2024-04-30 DIAGNOSIS — D510 Vitamin B12 deficiency anemia due to intrinsic factor deficiency: Secondary | ICD-10-CM

## 2024-04-30 MED ORDER — CYANOCOBALAMIN 1000 MCG/ML IJ SOLN
1000.0000 ug | Freq: Once | INTRAMUSCULAR | Status: AC
  Administered 2024-04-30: 1000 ug via INTRAMUSCULAR
  Filled 2024-04-30: qty 1

## 2024-05-19 ENCOUNTER — Inpatient Hospital Stay

## 2024-06-16 ENCOUNTER — Inpatient Hospital Stay: Attending: Oncology

## 2024-07-14 ENCOUNTER — Inpatient Hospital Stay: Attending: Oncology

## 2024-08-11 ENCOUNTER — Inpatient Hospital Stay

## 2024-08-11 ENCOUNTER — Inpatient Hospital Stay: Attending: Oncology | Admitting: Hematology and Oncology

## 2024-10-20 ENCOUNTER — Ambulatory Visit: Admitting: Internal Medicine

## 2024-12-20 ENCOUNTER — Ambulatory Visit
